# Patient Record
Sex: Female | Born: 1980
Health system: Southern US, Community
[De-identification: ages and names within clinical notes are randomized; demographics above are authoritative.]

## PROBLEM LIST (undated history)

## (undated) DIAGNOSIS — D649 Anemia, unspecified: Secondary | ICD-10-CM

## (undated) DIAGNOSIS — C866 Primary cutaneous CD30-positive T-cell proliferations not having achieved remission: Secondary | ICD-10-CM

## (undated) DIAGNOSIS — L509 Urticaria, unspecified: Secondary | ICD-10-CM

## (undated) DIAGNOSIS — G709 Myoneural disorder, unspecified: Secondary | ICD-10-CM

## (undated) DIAGNOSIS — F419 Anxiety disorder, unspecified: Secondary | ICD-10-CM

## (undated) DIAGNOSIS — F329 Major depressive disorder, single episode, unspecified: Secondary | ICD-10-CM

## (undated) DIAGNOSIS — K219 Gastro-esophageal reflux disease without esophagitis: Secondary | ICD-10-CM

## (undated) DIAGNOSIS — M797 Fibromyalgia: Secondary | ICD-10-CM

## (undated) DIAGNOSIS — G250 Essential tremor: Secondary | ICD-10-CM

## (undated) DIAGNOSIS — K589 Irritable bowel syndrome without diarrhea: Secondary | ICD-10-CM

## (undated) DIAGNOSIS — F319 Bipolar disorder, unspecified: Secondary | ICD-10-CM

## (undated) DIAGNOSIS — T7840XA Allergy, unspecified, initial encounter: Secondary | ICD-10-CM

## (undated) DIAGNOSIS — Z6791 Unspecified blood type, Rh negative: Secondary | ICD-10-CM

## (undated) DIAGNOSIS — F32A Depression, unspecified: Secondary | ICD-10-CM

## (undated) DIAGNOSIS — E538 Deficiency of other specified B group vitamins: Secondary | ICD-10-CM

## (undated) DIAGNOSIS — N879 Dysplasia of cervix uteri, unspecified: Secondary | ICD-10-CM

## (undated) HISTORY — PX: CERVICAL BIOPSY  W/ LOOP ELECTRODE EXCISION: SUR135

## (undated) HISTORY — DX: Irritable bowel syndrome, unspecified: K58.9

## (undated) HISTORY — DX: Fibromyalgia: M79.7

## (undated) HISTORY — DX: Depression, unspecified: F32.A

## (undated) HISTORY — DX: Allergy, unspecified, initial encounter: T78.40XA

## (undated) HISTORY — DX: Bipolar disorder, unspecified: F31.9

## (undated) HISTORY — DX: Dysplasia of cervix uteri, unspecified: N87.9

## (undated) HISTORY — PX: COLPOSCOPY: SHX161

## (undated) HISTORY — DX: Essential tremor: G25.0

## (undated) HISTORY — DX: Primary cutaneous CD30-positive T-cell proliferations: C86.6

## (undated) HISTORY — DX: Unspecified blood type, rh negative: Z67.91

## (undated) HISTORY — DX: Myoneural disorder, unspecified: G70.9

## (undated) HISTORY — DX: Anemia, unspecified: D64.9

## (undated) HISTORY — DX: Primary cutaneous CD30-positive T-cell proliferations not having achieved remission: C86.60

## (undated) HISTORY — DX: Deficiency of other specified B group vitamins: E53.8

## (undated) HISTORY — DX: Major depressive disorder, single episode, unspecified: F32.9

## (undated) HISTORY — DX: Urticaria, unspecified: L50.9

## (undated) HISTORY — DX: Anxiety disorder, unspecified: F41.9

## (undated) HISTORY — DX: Gastro-esophageal reflux disease without esophagitis: K21.9

---

## 1998-10-31 ENCOUNTER — Emergency Department (HOSPITAL_COMMUNITY): Admission: EM | Admit: 1998-10-31 | Discharge: 1998-10-31 | Payer: Self-pay | Admitting: Emergency Medicine

## 1999-05-06 ENCOUNTER — Emergency Department (HOSPITAL_COMMUNITY): Admission: EM | Admit: 1999-05-06 | Discharge: 1999-05-06 | Payer: Self-pay | Admitting: Emergency Medicine

## 1999-05-09 ENCOUNTER — Emergency Department (HOSPITAL_COMMUNITY): Admission: EM | Admit: 1999-05-09 | Discharge: 1999-05-09 | Payer: Self-pay | Admitting: Emergency Medicine

## 2002-02-06 ENCOUNTER — Encounter (INDEPENDENT_AMBULATORY_CARE_PROVIDER_SITE_OTHER): Payer: Self-pay | Admitting: Gastroenterology

## 2002-05-20 ENCOUNTER — Encounter (INDEPENDENT_AMBULATORY_CARE_PROVIDER_SITE_OTHER): Payer: Self-pay | Admitting: Gastroenterology

## 2002-05-20 ENCOUNTER — Encounter: Payer: Self-pay | Admitting: Internal Medicine

## 2002-05-20 LAB — HM COLONOSCOPY: HM Colonoscopy: NORMAL

## 2003-01-17 HISTORY — PX: DILATION AND CURETTAGE OF UTERUS: SHX78

## 2003-07-04 ENCOUNTER — Inpatient Hospital Stay (HOSPITAL_COMMUNITY): Admission: AD | Admit: 2003-07-04 | Discharge: 2003-07-04 | Payer: Self-pay | Admitting: Obstetrics and Gynecology

## 2003-07-11 ENCOUNTER — Ambulatory Visit (HOSPITAL_COMMUNITY): Admission: AD | Admit: 2003-07-11 | Discharge: 2003-07-11 | Payer: Self-pay | Admitting: Obstetrics and Gynecology

## 2003-07-11 ENCOUNTER — Encounter (INDEPENDENT_AMBULATORY_CARE_PROVIDER_SITE_OTHER): Payer: Self-pay | Admitting: Specialist

## 2004-05-19 ENCOUNTER — Ambulatory Visit (HOSPITAL_COMMUNITY): Admission: RE | Admit: 2004-05-19 | Discharge: 2004-05-19 | Payer: Self-pay | Admitting: Obstetrics and Gynecology

## 2004-06-02 ENCOUNTER — Inpatient Hospital Stay (HOSPITAL_COMMUNITY): Admission: AD | Admit: 2004-06-02 | Discharge: 2004-06-02 | Payer: Self-pay | Admitting: Obstetrics and Gynecology

## 2004-09-25 ENCOUNTER — Inpatient Hospital Stay (HOSPITAL_COMMUNITY): Admission: AD | Admit: 2004-09-25 | Discharge: 2004-09-25 | Payer: Self-pay | Admitting: Obstetrics and Gynecology

## 2004-11-14 ENCOUNTER — Inpatient Hospital Stay (HOSPITAL_COMMUNITY): Admission: AD | Admit: 2004-11-14 | Discharge: 2004-11-14 | Payer: Self-pay | Admitting: Obstetrics and Gynecology

## 2004-11-17 ENCOUNTER — Inpatient Hospital Stay (HOSPITAL_COMMUNITY): Admission: AD | Admit: 2004-11-17 | Discharge: 2004-11-17 | Payer: Self-pay | Admitting: Obstetrics and Gynecology

## 2004-11-19 ENCOUNTER — Inpatient Hospital Stay (HOSPITAL_COMMUNITY): Admission: AD | Admit: 2004-11-19 | Discharge: 2004-11-19 | Payer: Self-pay | Admitting: Obstetrics and Gynecology

## 2004-12-10 ENCOUNTER — Inpatient Hospital Stay (HOSPITAL_COMMUNITY): Admission: AD | Admit: 2004-12-10 | Discharge: 2004-12-10 | Payer: Self-pay | Admitting: Obstetrics and Gynecology

## 2004-12-23 ENCOUNTER — Inpatient Hospital Stay (HOSPITAL_COMMUNITY): Admission: AD | Admit: 2004-12-23 | Discharge: 2004-12-23 | Payer: Self-pay | Admitting: Obstetrics and Gynecology

## 2004-12-28 ENCOUNTER — Encounter (INDEPENDENT_AMBULATORY_CARE_PROVIDER_SITE_OTHER): Payer: Self-pay | Admitting: Specialist

## 2004-12-28 ENCOUNTER — Inpatient Hospital Stay (HOSPITAL_COMMUNITY): Admission: RE | Admit: 2004-12-28 | Discharge: 2004-12-31 | Payer: Self-pay | Admitting: Obstetrics and Gynecology

## 2005-04-04 ENCOUNTER — Ambulatory Visit: Payer: Self-pay | Admitting: Internal Medicine

## 2006-04-23 ENCOUNTER — Ambulatory Visit: Payer: Self-pay | Admitting: Internal Medicine

## 2006-08-22 ENCOUNTER — Ambulatory Visit: Payer: Self-pay | Admitting: Internal Medicine

## 2006-08-22 LAB — CONVERTED CEMR LAB
Bilirubin Urine: NEGATIVE
Ketones, ur: NEGATIVE mg/dL
Leukocytes, UA: NEGATIVE
Nitrite: NEGATIVE
Specific Gravity, Urine: 1.02 (ref 1.000–1.03)
Urine Glucose: NEGATIVE mg/dL
Urobilinogen, UA: 1 (ref 0.0–1.0)
pH: 7 (ref 5.0–8.0)

## 2006-08-25 ENCOUNTER — Encounter: Payer: Self-pay | Admitting: Internal Medicine

## 2006-08-25 DIAGNOSIS — R55 Syncope and collapse: Secondary | ICD-10-CM | POA: Insufficient documentation

## 2006-08-25 DIAGNOSIS — F319 Bipolar disorder, unspecified: Secondary | ICD-10-CM | POA: Insufficient documentation

## 2007-03-08 ENCOUNTER — Telehealth: Payer: Self-pay | Admitting: Internal Medicine

## 2008-01-06 ENCOUNTER — Ambulatory Visit: Payer: Self-pay | Admitting: Diagnostic Radiology

## 2008-01-06 ENCOUNTER — Emergency Department (HOSPITAL_BASED_OUTPATIENT_CLINIC_OR_DEPARTMENT_OTHER): Admission: EM | Admit: 2008-01-06 | Discharge: 2008-01-06 | Payer: Self-pay | Admitting: Emergency Medicine

## 2008-01-06 ENCOUNTER — Encounter: Payer: Self-pay | Admitting: Internal Medicine

## 2008-01-20 ENCOUNTER — Ambulatory Visit: Payer: Self-pay | Admitting: Internal Medicine

## 2008-01-20 DIAGNOSIS — J069 Acute upper respiratory infection, unspecified: Secondary | ICD-10-CM | POA: Insufficient documentation

## 2008-01-20 DIAGNOSIS — G4700 Insomnia, unspecified: Secondary | ICD-10-CM | POA: Insufficient documentation

## 2008-01-20 DIAGNOSIS — G253 Myoclonus: Secondary | ICD-10-CM | POA: Insufficient documentation

## 2008-01-20 DIAGNOSIS — E538 Deficiency of other specified B group vitamins: Secondary | ICD-10-CM | POA: Insufficient documentation

## 2008-01-20 LAB — CONVERTED CEMR LAB
BUN: 11 mg/dL (ref 6–23)
CO2: 21 meq/L (ref 19–32)
Calcium: 9 mg/dL (ref 8.4–10.5)
Chloride: 100 meq/L (ref 96–112)
Creatinine, Ser: 0.74 mg/dL (ref 0.40–1.20)
Glucose, Bld: 74 mg/dL (ref 70–99)
Magnesium: 2.1 mg/dL (ref 1.5–2.5)
Potassium: 4.2 meq/L (ref 3.5–5.3)
Sed Rate: 15 mm/hr (ref 0–22)
Sodium: 137 meq/L (ref 135–145)
TSH: 1.074 microintl units/mL (ref 0.350–4.50)
Vit D, 1,25-Dihydroxy: 37 (ref 30–89)
Vitamin B-12: 219 pg/mL (ref 211–911)

## 2008-02-24 ENCOUNTER — Ambulatory Visit: Payer: Self-pay | Admitting: Internal Medicine

## 2008-04-06 ENCOUNTER — Telehealth: Payer: Self-pay | Admitting: Internal Medicine

## 2008-04-06 DIAGNOSIS — R109 Unspecified abdominal pain: Secondary | ICD-10-CM | POA: Insufficient documentation

## 2008-04-09 ENCOUNTER — Encounter (INDEPENDENT_AMBULATORY_CARE_PROVIDER_SITE_OTHER): Payer: Self-pay | Admitting: *Deleted

## 2008-04-13 ENCOUNTER — Telehealth: Payer: Self-pay | Admitting: Internal Medicine

## 2008-04-13 DIAGNOSIS — K219 Gastro-esophageal reflux disease without esophagitis: Secondary | ICD-10-CM | POA: Insufficient documentation

## 2008-04-14 ENCOUNTER — Encounter: Payer: Self-pay | Admitting: Internal Medicine

## 2008-04-20 ENCOUNTER — Telehealth: Payer: Self-pay | Admitting: Internal Medicine

## 2008-04-20 ENCOUNTER — Ambulatory Visit: Payer: Self-pay | Admitting: Internal Medicine

## 2008-04-20 DIAGNOSIS — R259 Unspecified abnormal involuntary movements: Secondary | ICD-10-CM | POA: Insufficient documentation

## 2008-04-21 LAB — CONVERTED CEMR LAB
ALT: 20 units/L (ref 0–35)
AST: 19 units/L (ref 0–37)
Albumin: 3.8 g/dL (ref 3.5–5.2)
Alkaline Phosphatase: 36 units/L — ABNORMAL LOW (ref 39–117)
BUN: 8 mg/dL (ref 6–23)
Basophils Absolute: 0 10*3/uL (ref 0.0–0.1)
Basophils Relative: 0 % (ref 0–1)
Bilirubin, Direct: 0.1 mg/dL (ref 0.0–0.3)
CO2: 22 meq/L (ref 19–32)
Calcium: 8.9 mg/dL (ref 8.4–10.5)
Chloride: 104 meq/L (ref 96–112)
Creatinine, Ser: 0.73 mg/dL (ref 0.40–1.20)
Eosinophils Absolute: 0 10*3/uL (ref 0.0–0.7)
Eosinophils Relative: 0 % (ref 0–5)
Folate: 6.2 ng/mL
Glucose, Bld: 82 mg/dL (ref 70–99)
HCT: 37.5 % (ref 36.0–46.0)
Hemoglobin: 12.5 g/dL (ref 12.0–15.0)
Indirect Bilirubin: 0.3 mg/dL (ref 0.0–0.9)
Lymphocytes Relative: 37 % (ref 12–46)
Lymphs Abs: 3.3 10*3/uL (ref 0.7–4.0)
MCHC: 33.3 g/dL (ref 30.0–36.0)
MCV: 84.7 fL (ref 78.0–100.0)
Monocytes Absolute: 0.5 10*3/uL (ref 0.1–1.0)
Monocytes Relative: 6 % (ref 3–12)
Neutro Abs: 5.1 10*3/uL (ref 1.7–7.7)
Neutrophils Relative %: 57 % (ref 43–77)
Platelets: 274 10*3/uL (ref 150–400)
Potassium: 4.3 meq/L (ref 3.5–5.3)
RBC: 4.43 M/uL (ref 3.87–5.11)
RDW: 12.7 % (ref 11.5–15.5)
Sed Rate: 8 mm/hr (ref 0–22)
Sodium: 139 meq/L (ref 135–145)
TSH: 0.78 microintl units/mL (ref 0.350–4.500)
Total Bilirubin: 0.4 mg/dL (ref 0.3–1.2)
Total Protein: 6.5 g/dL (ref 6.0–8.3)
Vitamin B-12: 406 pg/mL (ref 211–911)
WBC: 9 10*3/uL (ref 4.0–10.5)

## 2008-04-22 ENCOUNTER — Telehealth: Payer: Self-pay | Admitting: Internal Medicine

## 2008-04-25 ENCOUNTER — Encounter: Payer: Self-pay | Admitting: Internal Medicine

## 2008-04-29 ENCOUNTER — Telehealth: Payer: Self-pay | Admitting: Internal Medicine

## 2008-05-01 ENCOUNTER — Encounter: Payer: Self-pay | Admitting: Internal Medicine

## 2008-05-08 DIAGNOSIS — K209 Esophagitis, unspecified without bleeding: Secondary | ICD-10-CM | POA: Insufficient documentation

## 2008-05-08 DIAGNOSIS — K2940 Chronic atrophic gastritis without bleeding: Secondary | ICD-10-CM | POA: Insufficient documentation

## 2008-05-08 DIAGNOSIS — K5289 Other specified noninfective gastroenteritis and colitis: Secondary | ICD-10-CM | POA: Insufficient documentation

## 2008-05-08 DIAGNOSIS — K589 Irritable bowel syndrome without diarrhea: Secondary | ICD-10-CM | POA: Insufficient documentation

## 2008-06-06 ENCOUNTER — Ambulatory Visit: Payer: Self-pay | Admitting: Family Medicine

## 2008-06-06 DIAGNOSIS — L509 Urticaria, unspecified: Secondary | ICD-10-CM | POA: Insufficient documentation

## 2008-07-01 ENCOUNTER — Telehealth: Payer: Self-pay | Admitting: Internal Medicine

## 2008-09-10 ENCOUNTER — Ambulatory Visit: Payer: Self-pay | Admitting: Internal Medicine

## 2008-09-10 DIAGNOSIS — L049 Acute lymphadenitis, unspecified: Secondary | ICD-10-CM | POA: Insufficient documentation

## 2008-10-21 ENCOUNTER — Ambulatory Visit: Payer: Self-pay | Admitting: Internal Medicine

## 2008-10-21 DIAGNOSIS — R5381 Other malaise: Secondary | ICD-10-CM | POA: Insufficient documentation

## 2008-10-21 DIAGNOSIS — Z720 Tobacco use: Secondary | ICD-10-CM | POA: Insufficient documentation

## 2008-10-21 DIAGNOSIS — R5383 Other fatigue: Secondary | ICD-10-CM

## 2008-10-22 ENCOUNTER — Telehealth: Payer: Self-pay | Admitting: Internal Medicine

## 2008-10-28 LAB — CONVERTED CEMR LAB
ALT: 22 units/L (ref 0–35)
AST: 16 units/L (ref 0–37)
Albumin: 4.2 g/dL (ref 3.5–5.2)
Alkaline Phosphatase: 52 units/L (ref 39–117)
BUN: 8 mg/dL (ref 6–23)
Bacteria, UA: NONE SEEN
Basophils Absolute: 0 10*3/uL (ref 0.0–0.1)
Basophils Relative: 0 % (ref 0–1)
Bilirubin, Direct: <0.1 mg/dL (ref 0.0–0.3)
CO2: 23 meq/L (ref 19–32)
Calcium: 9.1 mg/dL (ref 8.4–10.5)
Chloride: 105 meq/L (ref 96–112)
Creatinine, Ser: 0.83 mg/dL (ref 0.40–1.20)
Eosinophils Absolute: 0.1 10*3/uL (ref 0.0–0.7)
Eosinophils Relative: 1 % (ref 0–5)
Glucose, Bld: 103 mg/dL — ABNORMAL HIGH (ref 70–99)
HCT: 40.2 % (ref 36.0–46.0)
Hemoglobin: 13.9 g/dL (ref 12.0–15.0)
Lymphocytes Relative: 48 % — ABNORMAL HIGH (ref 12–46)
Lymphs Abs: 4.3 10*3/uL — ABNORMAL HIGH (ref 0.7–4.0)
MCHC: 34.6 g/dL (ref 30.0–36.0)
MCV: 85.7 fL (ref 78.0–100.0)
Monocytes Absolute: 0.7 10*3/uL (ref 0.1–1.0)
Monocytes Relative: 8 % (ref 3–12)
Neutro Abs: 3.8 10*3/uL (ref 1.7–7.7)
Neutrophils Relative %: 43 % (ref 43–77)
Platelets: 280 10*3/uL (ref 150–400)
Potassium: 4.5 meq/L (ref 3.5–5.3)
RBC / HPF: NONE SEEN (ref <3)
RBC: 4.69 M/uL (ref 3.87–5.11)
RDW: 11.8 % (ref 11.5–15.5)
Sed Rate: 5 mm/hr (ref 0–22)
Sodium: 139 meq/L (ref 135–145)
Total Bilirubin: 0.3 mg/dL (ref 0.3–1.2)
Total Protein: 7.1 g/dL (ref 6.0–8.3)
Vitamin B-12: 798 pg/mL (ref 211–911)
WBC, UA: NONE SEEN cells/hpf (ref <3)
WBC: 8.9 10*3/uL (ref 4.0–10.5)

## 2009-01-25 ENCOUNTER — Ambulatory Visit: Payer: Self-pay | Admitting: Internal Medicine

## 2009-01-25 DIAGNOSIS — J020 Streptococcal pharyngitis: Secondary | ICD-10-CM | POA: Insufficient documentation

## 2009-01-25 LAB — CONVERTED CEMR LAB: Rapid Strep: POSITIVE

## 2009-07-08 ENCOUNTER — Telehealth: Payer: Self-pay | Admitting: Internal Medicine

## 2009-07-26 ENCOUNTER — Ambulatory Visit: Payer: Self-pay | Admitting: Internal Medicine

## 2009-07-26 ENCOUNTER — Encounter (INDEPENDENT_AMBULATORY_CARE_PROVIDER_SITE_OTHER): Payer: Self-pay | Admitting: *Deleted

## 2009-07-26 DIAGNOSIS — J019 Acute sinusitis, unspecified: Secondary | ICD-10-CM | POA: Insufficient documentation

## 2009-08-06 ENCOUNTER — Encounter: Payer: Self-pay | Admitting: Internal Medicine

## 2009-08-11 ENCOUNTER — Ambulatory Visit: Payer: Self-pay | Admitting: Internal Medicine

## 2009-10-04 ENCOUNTER — Inpatient Hospital Stay (HOSPITAL_COMMUNITY): Admission: RE | Admit: 2009-10-04 | Discharge: 2009-10-07 | Payer: Self-pay | Admitting: Obstetrics and Gynecology

## 2009-10-07 ENCOUNTER — Encounter: Admission: RE | Admit: 2009-10-07 | Discharge: 2009-11-03 | Payer: Self-pay | Admitting: Obstetrics and Gynecology

## 2009-10-12 ENCOUNTER — Encounter: Admission: RE | Admit: 2009-10-12 | Discharge: 2009-10-12 | Payer: Self-pay | Admitting: Obstetrics and Gynecology

## 2009-10-13 ENCOUNTER — Inpatient Hospital Stay (HOSPITAL_COMMUNITY): Admission: AD | Admit: 2009-10-13 | Discharge: 2009-10-15 | Payer: Self-pay | Admitting: Obstetrics and Gynecology

## 2009-11-11 ENCOUNTER — Telehealth: Payer: Self-pay | Admitting: Internal Medicine

## 2010-01-21 ENCOUNTER — Emergency Department (HOSPITAL_COMMUNITY)
Admission: EM | Admit: 2010-01-21 | Discharge: 2010-01-21 | Payer: Self-pay | Source: Home / Self Care | Admitting: Emergency Medicine

## 2010-01-24 ENCOUNTER — Telehealth: Payer: Self-pay | Admitting: Internal Medicine

## 2010-01-25 ENCOUNTER — Telehealth: Payer: Self-pay | Admitting: Internal Medicine

## 2010-01-25 ENCOUNTER — Ambulatory Visit
Admission: RE | Admit: 2010-01-25 | Discharge: 2010-01-25 | Payer: Self-pay | Source: Home / Self Care | Attending: Internal Medicine | Admitting: Internal Medicine

## 2010-01-25 DIAGNOSIS — R61 Generalized hyperhidrosis: Secondary | ICD-10-CM | POA: Insufficient documentation

## 2010-01-25 DIAGNOSIS — I10 Essential (primary) hypertension: Secondary | ICD-10-CM | POA: Insufficient documentation

## 2010-01-31 LAB — URINALYSIS, ROUTINE W REFLEX MICROSCOPIC
Bilirubin Urine: NEGATIVE
Hgb urine dipstick: NEGATIVE
Ketones, ur: NEGATIVE mg/dL
Nitrite: NEGATIVE
Protein, ur: NEGATIVE mg/dL
Specific Gravity, Urine: 1.03 (ref 1.005–1.030)
Urine Glucose, Fasting: NEGATIVE mg/dL
Urobilinogen, UA: 1 mg/dL (ref 0.0–1.0)
pH: 6 (ref 5.0–8.0)

## 2010-01-31 LAB — POCT I-STAT, CHEM 8
BUN: 7 mg/dL (ref 6–23)
Calcium, Ion: 1.11 mmol/L — ABNORMAL LOW (ref 1.12–1.32)
Chloride: 108 mEq/L (ref 96–112)
Creatinine, Ser: 0.7 mg/dL (ref 0.4–1.2)
Glucose, Bld: 92 mg/dL (ref 70–99)
HCT: 40 % (ref 36.0–46.0)
Hemoglobin: 13.6 g/dL (ref 12.0–15.0)
Potassium: 3.5 mEq/L (ref 3.5–5.1)
Sodium: 140 mEq/L (ref 135–145)
TCO2: 25 mmol/L (ref 0–100)

## 2010-01-31 LAB — DIFFERENTIAL
Basophils Absolute: 0.1 10*3/uL (ref 0.0–0.1)
Basophils Relative: 1 % (ref 0–1)
Eosinophils Absolute: 0.1 10*3/uL (ref 0.0–0.7)
Eosinophils Relative: 2 % (ref 0–5)
Lymphocytes Relative: 49 % — ABNORMAL HIGH (ref 12–46)
Lymphs Abs: 4 10*3/uL (ref 0.7–4.0)
Monocytes Absolute: 0.5 10*3/uL (ref 0.1–1.0)
Monocytes Relative: 6 % (ref 3–12)
Neutro Abs: 3.5 10*3/uL (ref 1.7–7.7)
Neutrophils Relative %: 43 % (ref 43–77)

## 2010-01-31 LAB — CBC
HCT: 38.2 % (ref 36.0–46.0)
Hemoglobin: 13.3 g/dL (ref 12.0–15.0)
MCH: 30.6 pg (ref 26.0–34.0)
MCHC: 34.8 g/dL (ref 30.0–36.0)
MCV: 88 fL (ref 78.0–100.0)
Platelets: 262 10*3/uL (ref 150–400)
RBC: 4.34 MIL/uL (ref 3.87–5.11)
RDW: 13.2 % (ref 11.5–15.5)
WBC: 8.1 10*3/uL (ref 4.0–10.5)

## 2010-02-02 ENCOUNTER — Telehealth: Payer: Self-pay | Admitting: Internal Medicine

## 2010-02-06 ENCOUNTER — Encounter: Payer: Self-pay | Admitting: Obstetrics and Gynecology

## 2010-02-08 ENCOUNTER — Telehealth: Payer: Self-pay | Admitting: Internal Medicine

## 2010-02-15 NOTE — Assessment & Plan Note (Signed)
Summary: SINUS PRESSURE---DR AVP PT/NO CLINIC--STC   Vital Signs:  Patient profile:   30 year old female Height:      68 inches (172.72 cm) O2 Sat:      97 % on Room air Temp:     97.9 degrees F (36.61 degrees C) oral Pulse rate:   111 / minute BP sitting:   110 / 68  (left arm) Cuff size:   regular  Vitals Entered By: Orlan Leavens (July 26, 2009 2:01 PM)  O2 Flow:  Room air CC: sinus pressure, URI symptoms Is Patient Diabetic? No Pain Assessment Patient in pain? no      Comments Pt states she is [redacted] weeks pregnant been having sinus pressure no relief   Primary Care Provider:  Georgina Quint Plotnikov MD  CC:  sinus pressure and URI symptoms.  History of Present Illness:  URI Symptoms      This is a 30 year old woman who presents with URI symptoms.  The symptoms began 1 week ago.  The severity is described as moderate.  progressive pressure in maxillary region. +nasal drainage. [redacted] weeks pregnant - called ob-gyn 1st who referred to her PCP. using OTC tylenol cold and sinus with incomplete relief.  The patient reports nasal congestion, purulent nasal discharge, sore throat, dry cough, and earache, but denies clear nasal discharge and sick contacts.  Associated symptoms include low-grade fever (<100.5 degrees).  The patient denies dyspnea, wheezing, rash, vomiting, and diarrhea.  The patient also reports headache and muscle aches.  The patient denies sneezing and seasonal symptoms.  Risk factors for Strep sinusitis include tooth pain.  The patient denies the following risk factors for Strep sinusitis: tender adenopathy and absence of cough.    Current Medications (verified): 1)  Vitamin B-12 Cr 1000 Mcg  Tbcr (Cyanocobalamin) .... Take One Tablet By Mouth Daily 2)  Omeprazole 20 Mg Cpdr (Omeprazole) .... 2 Once Daily 3)  Prenatal Vitamin  Allergies (verified): 1)  Zoloft (Sertraline Hcl) 2)  Carbidopa-Levodopa (Carbidopa-Levodopa)  Past History:  Past Medical History: Was checked  for seizures w Brayton Layman. 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  The patient denies fever, decreased hearing, hoarseness, and chest pain.    Physical Exam  General:  alert, well-developed, well-nourished, well-hydrated, appropriate dress, normal appearance, healthy-appearing, cooperative to examination, and good hygiene.  29wk preg Eyes:  vision grossly intact; pupils equal, round and reactive to light.  conjunctiva and lids normal.    Ears:  normal pinnae bilaterally, without erythema, swelling, or tenderness to palpation. R TM clear, L TM hazy with erythema but without effusion, or cerumen impaction. Hearing grossly normal bilaterally  Mouth:  teeth and gums in good repair; mucous membranes moist, without lesions or ulcers. oropharynx clear without exudate, mod erythema.  Neck:  supple, full ROM, no masses, no thyromegaly; no thyroid nodules or tenderness. no JVD or carotid bruits.   Lungs:  normal respiratory effort, no intercostal retractions or use of accessory muscles; normal breath sounds bilaterally - no crackles and no wheezes.    Heart:  normal rate, regular rhythm, no murmur, and no rub. BLE without edema.   Impression & Recommendations:  Problem # 1:  ACUTE SINUSITIS, UNSPECIFIED (ICD-461.9)  progressive symptoms and classic hx - tx 7 days abx  with caution  - advised to f/u ob as ongoing (appt tomorrow) cont tylenol cold med for ache and discomfort - emperic diflucan for hx yeast infx s/p abx use - Her updated medication list for this problem includes:    Amoxicillin 500 Mg Tabs (Amoxicillin) .Marland Kitchen... 1 by mouth three times a day x 7 days  Instructed on treatment. Call if symptoms persist or worsen.   Orders: Prescription Created Electronically [phone])  Complete Medication  List: 1)  Vitamin B-12 Cr 1000 Mcg Tbcr (Cyanocobalamin) .... Take one tablet by mouth daily 2)  Omeprazole 20 Mg Cpdr (Omeprazole) .... 2 once daily 3)  Prenatal Vitamin  4)  Amoxicillin 500 Mg Tabs (Amoxicillin) .Marland Kitchen.. 1 by mouth three times a day x 7 days 5)  Diflucan 150 Mg Tabs (Fluconazole) .Marland Kitchen.. 1 by mouth x 1 as needed for yeast, may repeat as needed daily  Patient Instructions: 1)  it was good to see you today. 2)  amoxicillin for your sinus symptoms and infection - also diflucan as discussed - your prescriptions have been electronically submitted to your pharmacy. Please take as directed. Contact our office if you believe you're having problems with the medication(s).  3)  continue the tylenol for aches and pains - call dr. Richardson Dopp if something "stronger" is needed - 4)  work note for today provided as discussed - 5)  Get plenty of rest, drink lots of clear liquids, and use Tylenol  for fever and comfort. Return in 7-10 days if you're not better:sooner if you're feeling worse. Prescriptions: DIFLUCAN 150 MG TABS (FLUCONAZOLE) 1 by mouth x 1 as needed for yeast, may repeat as needed daily  #2 x 1   Entered and Authorized by:   Newt Lukes MD   Signed by:   Newt Lukes MD on 07/26/2009   Method used:   Electronically to        [id number] (retail)       [address]       [place], [place]  [address]       Ph: [phone]       Fax: [phone]   RxID:   [id number] AMOXICILLIN 500 MG TABS (AMOXICILLIN) 1 by mouth three times a day x 7 days  #21 x 0   Entered and Authorized by:   Newt Lukes MD   Signed by:   Newt Lukes MD on 07/26/2009   Method used:   Electronically to        [id number]* (retail)       [address]       [place], [place]  [address]       Ph: [phone]       Fax: [phone]   RxID:   [id number]

## 2010-02-15 NOTE — Assessment & Plan Note (Signed)
Summary: DR AVP PT-WHITE POCKETS/L SORE THROAT W/L EAR ACHE-DR AVP NO ...   Vital Signs:  Patient profile:   30 year old female Height:      68 inches Weight:      171 pounds BMI:     26.09 O2 Sat:      98 % on Room air Temp:     99.4 degrees F oral Pulse rate:   83 / minute Pulse rhythm:   regular Resp:     16 per minute BP sitting:   118 / 82  (left arm) Cuff size:   large  Vitals Entered By: Rock Nephew CMA (January 25, 2009 10:09 AM)  Nutrition Counseling: Patient's BMI is greater than 25 and therefore counseled on weight management options.  O2 Flow:  Room air CC: sore throat, fever x 1wk, URI symptoms   Primary Care Provider:  Tresa Garter MD  CC:  sore throat, fever x 1wk, and URI symptoms.  History of Present Illness:  URI Symptoms      This is a 30 year old woman who presents with URI symptoms.  The symptoms began 2 days ago.  The severity is described as moderate.  The patient reports sore throat, earache, and sick contacts, but denies nasal congestion, clear nasal discharge, purulent nasal discharge, and dry cough.  Associated symptoms include fever of 100.5-103 degrees.  The patient denies stiff neck, dyspnea, wheezing, rash, vomiting, diarrhea, and use of an antipyretic.  The patient also reports muscle aches.  The patient denies headache and severe fatigue.  The patient denies the following risk factors for Strep sinusitis: double sickening, tender adenopathy, and absence of cough.    Preventive Screening-Counseling & Management  Alcohol-Tobacco     Alcohol drinks/day: 0     Smoking Status: current     Packs/Day: 1 ppd  Caffeine-Diet-Exercise     Does Patient Exercise: yes      Drug Use:  no.    Current Medications (verified): 1)  Temazepam 15 Mg Caps (Temazepam) .Marland Kitchen.. 1-2 By Mouth At Bedtime As Needed Insomnia 2)  Vitamin B-12 Cr 1000 Mcg  Tbcr (Cyanocobalamin) .... Take One Tablet By Mouth Daily 3)  Vitamin D3 1000 Unit  Tabs (Cholecalciferol)  .Marland Kitchen.. 1 By Mouth Daily 4)  Triamcinolone Acetonide 0.5 % Crea (Triamcinolone Acetonide) .... Apply Bid To Affected Area 5)  Omeprazole 20 Mg Cpdr (Omeprazole) .... 2 Once Daily 6)  Prenatal Vitamin 7)  Progesterone  Allergies (verified): 1)  Zoloft (Sertraline Hcl) 2)  Carbidopa-Levodopa (Carbidopa-Levodopa)  Past History:  Past Medical History: Reviewed history from 04/20/2008 and no changes required. Was checked for seizures w Brayton Layman. 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 GM - siezures  Social History: Reviewed history from 01/20/2008 and no changes required. [facility]  Current Smoker Alcohol use-no Married Drug use-no Regular exercise-yes Drug Use:  no Does Patient Exercise:  yes  Review of Systems       The patient complains of fever and enlarged lymph nodes.  The patient denies anorexia, chest pain, hemoptysis, abdominal pain, and angioedema.    Physical Exam  General:  alert, well-developed, well-nourished, well-hydrated, appropriate dress, normal appearance, healthy-appearing, cooperative to examination, and good hygiene.   Head:  normocephalic and atraumatic.   Ears:  R cerumen impaction and L TM erythema.   Nose:  External nasal examination shows no deformity or inflammation. Nasal mucosa are pink and moist without lesions or exudates. Mouth:  no exudates, no posterior lymphoid hypertrophy, no postnasal drip, no pharyngeal crowing, no lesions, no aphthous ulcers, no leukoplakia, no petechiae, and pharyngeal erythema.   Neck:  supple, full ROM, no masses, no thyromegaly, no thyroid nodules or tenderness, no neck tenderness, and cervical lymphadenopathy.   Lungs:  normal respiratory effort, no  intercostal retractions, no accessory muscle use, normal breath sounds, no dullness, no fremitus, no crackles, and no wheezes.   Heart:  normal rate, regular rhythm, no murmur, no gallop, no rub, and no JVD.   Abdomen:  soft, non-tender, normal bowel sounds, no distention, no masses, no guarding, no hepatomegaly, and no splenomegaly.   Skin:  Intact without suspicious lesions or rashes Cervical Nodes:  L anterior LN tender and R anterior LN enlarged.   Axillary Nodes:  no R axillary adenopathy and no L axillary adenopathy.   Inguinal Nodes:  no R inguinal adenopathy and no L inguinal adenopathy.   Psych:  Oriented X3.     Impression & Recommendations:  Problem # 1:  PHARYNGITIS-ACUTE (ICD-462) Assessment New  The following medications were removed from the medication list:    Flagyl 500 Mg Tabs (Metronidazole) .Marland Kitchen... Three times a day Her updated medication list for this problem includes:    Amoxicillin 500 Mg Cap (Amoxicillin) .Marland Kitchen... Take 1 capsule by mouth three times a day x 10 days  Orders: Rapid Strep (10[address])  Instructed to complete antibiotics and call if not improved in 48 hours.   Problem # 2:  STREP THROAT (ICD-034.0) Assessment: New  The following medications were removed from the medication list:    Flagyl 500 Mg Tabs (Metronidazole) .Marland Kitchen... Three times a day Her updated medication list for this problem includes:    Amoxicillin 500 Mg Cap (Amoxicillin) .Marland Kitchen... Take 1 capsule by mouth three times a day x 10 days  Orders: Rapid Strep (53664)  Instructed to complete antibiotics and call if not improved in 48 hours.   Complete Medication List: 1)  Temazepam 15 Mg Caps (Temazepam) .Marland Kitchen.. 1-2 by mouth at bedtime as needed insomnia 2)  Vitamin B-12 Cr 1000 Mcg Tbcr (Cyanocobalamin) .... Take one tablet by mouth daily 3)  Vitamin D3 1000 Unit Tabs (Cholecalciferol) .Marland Kitchen.. 1 by mouth daily 4)  Triamcinolone Acetonide 0.5 % Crea (Triamcinolone acetonide) .... Apply bid to affected  area 5)  Omeprazole 20 Mg Cpdr (Omeprazole) .... 2 once daily 6)  Prenatal Vitamin  7)  Progesterone  8)  Amoxicillin 500 Mg Cap (Amoxicillin) .... Take 1 capsule by mouth three times a day x 10 days 9)  Diflucan 150 Mg Tabs (Fluconazole) .... One by mouth as directed  Patient Instructions: 1)  Please schedule a follow-up appointment in 1 month. 2)  Get plenty of rest, drink lots of clear liquids, and use Tylenol or Ibuprofen for fever and comfort. Return in 7-10 days if you're not better:sooner if you're feeling worse. 3)  Take your antibiotic as prescribed until ALL of it is gone, but stop if you develop a rash or swelling and contact our office as soon as possible. Prescriptions: DIFLUCAN 150 MG TABS (FLUCONAZOLE) One by mouth as directed  #1 x 3   Entered and Authorized by:   Etta Grandchild MD   Signed by:   Etta Grandchild MD on 01/25/2009   Method  used:   Electronically to        [facility] [address]* (retail)       [address]       [place], [place]  [address]       Ph: [phone]       Fax: [phone]   RxID:   [id number] AMOXICILLIN 500 MG CAP (AMOXICILLIN) Take 1 capsule by mouth three times a day X 10 days  #30 x 0   Entered and Authorized by:   Etta Grandchild MD   Signed by:   Etta Grandchild MD on 01/25/2009   Method used:   Electronically to        [id number]* (retail)       [address]       [place], [place]  [address]       Ph: [phone]       Fax: [phone]   RxID:   [id number]   Laboratory Results    Other Tests  Rapid Strep: positive

## 2010-02-15 NOTE — Progress Notes (Signed)
Summary: OV TOMORROW  Phone Note Call from Patient Call back at [facility] Phone [phone]   Summary of Call: Pt had a c-section 6 wks ago. She developed a open wound below the csection incision approx 1 wk ago. OB gave pt oral antibiotic but told her that they did not think it was infected. She does not trust her OBGYN anymore and is very worried about the wound. No fever, pain is minor but she trusts Dr Posey Rea and wants him to eval. I scheduled pt for apt tomorrow and she will call or go to ER w/any severe symptoms.  Initial call taken by: Lamar Sprinkles, CMA,  November 11, 2009 11:43 AM  Follow-up for Phone Call        ok to work in Follow-up by: Tresa Garter MD,  November 11, 2009 1:29 PM

## 2010-02-15 NOTE — Letter (Signed)
Summary: Out of Work  [facility]  [address] [place], [place] [address]   Phone: [phone]  Fax: [phone]    July 26, 2009   Employee:  GALILEE PIERRON    To Whom It May Concern:   For Medical reasons, please excuse the above named employee from work for the following dates:  Start: 07/26/09    End: 07/27/09, may return to work Tuesday    If you need additional information, please feel free to contact our office.         Sincerely,    Dr. Rene Paci

## 2010-02-15 NOTE — Progress Notes (Signed)
Summary: Lab Orders  Phone Note Other Incoming   Caller: pt Summary of Call: pt needs a prescription written for her to have her physical labs drawn at [facility]. They can be faxed to [phone] Initial call taken by: Ami Bullins CMA,  July 08, 2009 11:18 AM  Follow-up for Phone Call        ok done Follow-up by: Tresa Garter MD,  July 08, 2009 12:45 PM  Additional Follow-up for Phone Call Additional follow up Details #1::        Done. Additional Follow-up by: Lucious Groves,  July 08, 2009 5:02 PM    New/Updated Medications: * LABS CBC, TSH, BMET, Hepatic panel, UA, Lipids, Dx: V70.0 Prescriptions: LABS CBC, TSH, BMET, Hepatic panel, UA, Lipids, Dx: V70.0  #0 x 0   Entered by:   Lucious Groves   Authorized by:   Tresa Garter MD   Signed by:   Lucious Groves on 07/08/2009   Method used:   Print then Give to Patient   RxID:   [id number]

## 2010-02-15 NOTE — Assessment & Plan Note (Signed)
Summary: CPX/[facility]/#/CD   Vital Signs:  Patient profile:   30 year old female Height:      68 inches Weight:      196 pounds BMI:     29.91 O2 Sat:      96 % on Room air Temp:     98.1 degrees F oral Pulse rate:   112 / minute Pulse rhythm:   regular Resp:     16 per minute BP sitting:   110 / 72  (left arm) Cuff size:   regular  Vitals Entered By: Lanier Prude, CMA(AAMA) (August 11, 2009 3:11 PM)  O2 Flow:  Room air CC: CPX Is Patient Diabetic? No Comments pt is not taking Diflucan   Primary Care Provider:  Tresa Garter MD  CC:  CPX.  History of Present Illness: The patient presents for a wellness examination  Gained 22 lbs  She is due in 2 wks  Current Medications (verified): 1)  Vitamin B-12 Cr 1000 Mcg  Tbcr (Cyanocobalamin) .... Take One Tablet By Mouth Daily 2)  Omeprazole 20 Mg Cpdr (Omeprazole) .... 2 Once Daily 3)  Prenatal Vitamin 4)  Diflucan 150 Mg Tabs (Fluconazole) .Marland Kitchen.. 1 By Mouth X 1 As Needed For Yeast, May Repeat As Needed Daily  Allergies (verified): 1)  Zoloft (Sertraline Hcl) 2)  Carbidopa-Levodopa (Carbidopa-Levodopa)  Past History:  Past Medical History: Last updated: 07/26/2009 Was checked for seizures w Brayton Layman. 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  The patient denies anorexia, fever, weight loss, vision loss, decreased hearing, hoarseness, chest pain, syncope, dyspnea on exertion, peripheral edema, prolonged cough, headaches, hemoptysis, abdominal pain,  melena, hematochezia, severe indigestion/heartburn, hematuria, incontinence, genital sores, muscle weakness, suspicious skin lesions, transient blindness, difficulty walking, depression, unusual weight change, abnormal bleeding, enlarged lymph nodes, angioedema, and breast masses.    Physical Exam  General:  alert, well-developed, well-nourished, pregnant, well-hydrated, appropriate dress, normal appearance, healthy-appearing, cooperative to examination, and good hygiene.   Head:  Normocephalic and atraumatic without obvious abnormalities. No apparent alopecia or balding. Eyes:  No corneal or conjunctival inflammation noted. EOMI. Perrla. Ears:  External ear exam shows no significant lesions or deformities.  Otoscopic examination reveals clear canals, tympanic membranes are intact bilaterally without bulging, retraction, inflammation or discharge. Hearing is grossly normal bilaterally. Nose:  External nasal examination shows no deformity or inflammation. Nasal mucosa are pink and moist without lesions or exudates. Mouth:  Oral mucosa and oropharynx without lesions or exudates.  Teeth in good repair. Neck:  No deformities, masses, or tenderness noted. Chest Wall:  No deformities, masses, or tenderness noted. Lungs:  Normal respiratory effort, chest expands symmetrically. Lungs are clear to auscultation, no crackles or wheezes. Heart:  Normal rate and regular rhythm. S1 and S2 normal without gallop, murmur, click, rub or other extra sounds. Abdomen:  Bowel sounds positive,abdomen soft and non-tender without masses, organomegaly or hernias noted. It is large due to pregnancy Msk:  No deformity or scoliosis noted of thoracic or lumbar spine.   Pulses:  R and L carotid,radial,femoral,dorsalis pedis and posterior  tibial pulses are full and equal bilaterally Extremities:  trace left pedal edema and trace right pedal edema.   Neurologic:  No cranial nerve deficits noted. Station and gait are normal.  Plantar reflexes are down-going bilaterally. DTRs are symmetrical throughout. Sensory, motor and coordinative functions appear intact. Skin:  Intact without suspicious lesions or rashes Cervical Nodes:  No lymphadenopathy noted Psych:  Cognition and judgment appear intact. Alert and cooperative with normal attention span and concentration. No apparent delusions, illusions, hallucinations   Impression & Recommendations:  Problem # 1:  PHYSICAL EXAMINATION (ICD-V70.0) Assessment New Health and age related issues were discussed. Available screening tests and vaccinations were discussed as well. Healthy life style including good diet and execise was discussed.  Labs were sent here from 6/6: Hgb 11.6, glu 116, chol 298, TSH 0.315 otherwise WNL. Labs should be rechecked in a nonpregnant test in 6 months   Problem # 2:  Late stages of pregnancy Assessment: New F/u w/her OB/GYN  Complete Medication List: 1)  Vitamin B-12 Cr 1000 Mcg Tbcr (Cyanocobalamin) .... Take one tablet by mouth daily 2)  Omeprazole 20 Mg Cpdr (Omeprazole) .... 2 once daily 3)  Prenatal Vitamin  4)  Diflucan 150 Mg Tabs (Fluconazole) .Marland Kitchen.. 1 by mouth x 1 as needed for yeast, may repeat as needed daily  Patient Instructions: 1)  Valerian root

## 2010-02-17 NOTE — Progress Notes (Signed)
Summary: RFs  Phone Note Call from Patient Call back at [facility] Phone [phone]   Summary of Call: Pt needs refills of BP med and klonopin. OK?  Initial call taken by: Lamar Sprinkles, CMA,  January 25, 2010 10:51 AM  Follow-up for Phone Call        ok to ref x 3 Follow-up by: Tresa Garter MD,  January 25, 2010 1:11 PM    New/Updated Medications: CLONAZEPAM 1 MG TABS (CLONAZEPAM) 1 to 2 once daily as needed Prescriptions: CLONAZEPAM 1 MG TABS (CLONAZEPAM) 1 to 2 once daily as needed  #60 x 0   Entered by:   Lamar Sprinkles, CMA   Authorized by:   Tresa Garter MD   Signed by:   Lamar Sprinkles, CMA on 01/26/2010   Method used:   Telephoned to ...       [facility] [address] (retail)       [address]       [place], [place]  [address]       Ph: [phone]       Fax: [phone]   RxID:   [id number] HYDROCHLOROTHIAZIDE 25 MG TABS (HYDROCHLOROTHIAZIDE) 1 by mouth once daily  #90 x 1   Entered by:   Lamar Sprinkles, CMA   Authorized by:   Jacques Navy MD   Signed by:   Lamar Sprinkles, CMA on 01/26/2010   Method used:   Electronically to        [facility] # [address]* (retail)       [address]       [place], [place]  [address]       Ph: [phone]       Fax: [phone]   RxID:   [id number]

## 2010-02-17 NOTE — Progress Notes (Signed)
Summary: med ?  Phone Note From Pharmacy   Caller: [facility] Summary of Call: rec fax stating pt says Klonopin 1mg  tab is suppose to be the oral disintegrating tablet. Please advise Initial call taken by: Lanier Prude, [facility]),  February 08, 2010 12:06 PM  Follow-up for Phone Call        ok  Follow-up by: Tresa Garter MD,  February 08, 2010 6:14 PM  Additional Follow-up for Phone Call Additional follow up Details #1::        Pharm informed Additional Follow-up by: Lamar Sprinkles, CMA,  February 09, 2010 10:35 AM    New/Updated Medications: CLONAZEPAM 1 MG TABS (CLONAZEPAM) 1 to 2 ODT tabs once daily as needed Prescriptions: CLONAZEPAM 1 MG TABS (CLONAZEPAM) 1 to 2 ODT tabs once daily as needed  #60 x 0   Entered by:   Lamar Sprinkles, CMA   Authorized by:   Tresa Garter MD   Signed by:   Lamar Sprinkles, CMA on 02/09/2010   Method used:   Telephoned to ...       [facility] # [address] (retail)       [address]       [place], [place]  [address]       Ph: [phone]       Fax: [phone]   RxID:   [id number]

## 2010-02-17 NOTE — Progress Notes (Signed)
Summary: Call Report  Phone Note Other Incoming   Caller: [facility] Summary of Call: [facility] Triage Call Report Triage Record Num: [id number] Operator: Amy Rhinehardt Patient Name: Christine Duncan Call Date & Time: 01/21/2010 6:46:55PM Patient Phone: [phone] PCP: Patient Gender: Female PCP Fax : Patient DOB: 06-Jan-1981 Practice Name: [facility] Reason for Call: Pt has hx of post-partum preeclampsia 4 months ago, was in ICU, currently is not under the care of a GYN, states that her is fluctuating at time of call it was 173/108, now  ~20 mins later it's 173/115 pulse 88, has HA, and "floaters on and off" and low energy, has been feeling like this all day today but "on and off since she stopped BP med one month ago", she was on Procardia but didn't go back for follow up so she hasn't been on medication at all for BP. RN advised caller to to ED now, her preference is [facility]. Protocol(s) Used: Hypertension, Diagnosed or Suspected Recommended Outcome per Protocol: See ED Immediately Reason for Outcome: New onset of visual disturbances such as double or blurred vision, spots before eyes or flashing lights Care Advice:  ~ Another adult should drive.  ~ IMMEDIATE ACTION 01/ Initial call taken by: Margaret Pyle, CMA,  January 24, 2010 8:08 AM  Follow-up for Phone Call        noted. Follow-up by: Tresa Garter MD,  January 24, 2010 1:03 PM

## 2010-02-17 NOTE — Assessment & Plan Note (Signed)
Summary: ER FU ON BP/ TODAY 130/90/ STILL NOT FEELING WELL/NWS   Vital Signs:  Patient profile:   30 year old female Height:      68 inches Weight:      173 pounds BMI:     26.40 Temp:     98.3 degrees F oral Pulse rate:   84 / minute Pulse rhythm:   regular Resp:     16 per minute BP sitting:   112 / 80  (left arm) Cuff size:   regular  Vitals Entered By: Lanier Prude, CMA(AAMA) (January 25, 2010 8:45 AM) CC: elevated blood pressure since having her last child >70mo ago Is Patient Diabetic? No Comments pt was recently eval/treated at [facility] ER for the above.  She is not taking Vit b12, Prenatal vit of Diflucan   Primary Care Provider:  Tresa Garter MD  CC:  elevated blood pressure since having her last child >67mo ago.  History of Present Illness: C/o BP elevation. She went to ER for BP176/113 on Fri and she got HCTZ C/o sweats C/o GERD  Current Medications (verified): 1)  Vitamin B-12 Cr 1000 Mcg  Tbcr (Cyanocobalamin) .... Take One Tablet By Mouth Daily 2)  Omeprazole 20 Mg Cpdr (Omeprazole) .... 2 Once Daily 3)  Prenatal Vitamin 4)  Diflucan 150 Mg Tabs (Fluconazole) .Marland Kitchen.. 1 By Mouth X 1 As Needed For Yeast, May Repeat As Needed Daily 5)  Beyaz 3-0.02-0.451 Mg Tabs (Drospiren-Eth Estrad-Levomefol) .Marland Kitchen.. 1 By Mouth Once Daily 6)  Hydrochlorothiazide 25 Mg Tabs (Hydrochlorothiazide) .Marland Kitchen.. 1 By Mouth Once Daily  Allergies (verified): 1)  Zoloft (Sertraline Hcl) 2)  Carbidopa-Levodopa (Carbidopa-Levodopa)  Past History:  Past Surgical History: Last updated: 01/25/2009 Denies surgical history  Family History: Last updated: 01/20/2008 GM - siezures  Past Medical History: Was checked for seizures w Brayton Layman. 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  The patient denies fever, weight loss, chest pain, and abdominal pain.    Physical Exam  General:  alert, well-developed, well-nourished, pregnant, well-hydrated, appropriate dress, normal appearance, healthy-appearing, cooperative to examination, and good hygiene.   Mouth:  Oral mucosa and oropharynx without lesions or exudates.  Teeth in good repair. Neck:  No deformities, masses, or tenderness noted. Lungs:  Normal respiratory effort, chest expands symmetrically. Lungs are clear to auscultation, no crackles or wheezes. Heart:  Normal rate and regular rhythm. S1 and S2 normal without gallop, murmur, click, rub or other extra sounds. Abdomen:  Bowel sounds positive,abdomen soft and non-tender without masses, organomegaly or hernias noted. It is large due to pregnancy Msk:  No deformity or scoliosis noted of thoracic or lumbar spine.   Extremities:  trace left pedal edema and trace right pedal edema.   Neurologic:  No cranial nerve deficits noted. Station and gait are normal. Plantar reflexes are down-going bilaterally. DTRs are symmetrical throughout. Sensory, motor and coordinative functions appear intact. Skin:  Intact without suspicious lesions or rashes Psych:  Cognition and judgment appear intact. Alert and cooperative with normal attention span and concentration. No apparent delusions, illusions, hallucinations   Impression & Recommendations:  Problem # 1:  HYPERTENSION (ICD-401.9) Assessment Deteriorated Improve diet Info re: Low sodium diet and HTN given Her updated medication list for this problem includes:  Hydrochlorothiazide 25 Mg Tabs (Hydrochlorothiazide) .Marland Kitchen... 1 by mouth once daily  Problem # 2:  SWEATING (ICD-780.8) Assessment: New Likely hormonal  Problem # 3:  B12 DEFICIENCY (ICD-266.2) Assessment: Comment Only On the regimen of medicine(s) reflected in the chart    Problem # 4:  GERD (ICD-530.81) Assessment:  Deteriorated Improve diet Her updated medication list for this problem includes:    Omeprazole 20 Mg Cpdr (Omeprazole) .Marland Kitchen... 2 once daily  Complete Medication List: 1)  Vitamin B-12 Cr 1000 Mcg Tbcr (Cyanocobalamin) .... Take one tablet by mouth daily 2)  Omeprazole 20 Mg Cpdr (Omeprazole) .... 2 once daily 3)  Prenatal Vitamin  4)  Diflucan 150 Mg Tabs (Fluconazole) .Marland Kitchen.. 1 by mouth x 1 as needed for yeast, may repeat as needed daily 5)  Beyaz 3-0.02-0.451 Mg Tabs (Drospiren-eth estrad-levomefol) .Marland Kitchen.. 1 by mouth once daily 6)  Hydrochlorothiazide 25 Mg Tabs (Hydrochlorothiazide) .Marland Kitchen.. 1 by mouth once daily  Patient Instructions: 1)  Low sodium diet 2)  Please schedule a follow-up appointment in 2 months. 3)  BMP prior to visit, ICD-9: 4)  TSH prior to visit, ICD-9:401.1 5)  Nl BP <130/85   Orders Added: 1)  Est. Patient Level IV [82956]

## 2010-02-17 NOTE — Progress Notes (Signed)
Summary: RF  Phone Note Refill Request   Refills Requested: Medication #1:  BEYAZ 3-0.02-0.451 MG TABS 1 by mouth once daily ok for 3 mth supply? (no longer seeing OBGYN)  Initial call taken by: Lamar Sprinkles, CMA,  February 02, 2010 11:53 AM  Follow-up for Phone Call        ok 3 mo Follow-up by: Tresa Garter MD,  February 03, 2010 7:43 AM  Additional Follow-up for Phone Call Additional follow up Details #1::        left vm on hm #  Additional Follow-up by: Lamar Sprinkles, CMA,  February 03, 2010 9:04 AM    Prescriptions: BEYAZ 3-0.02-0.451 MG TABS (DROSPIREN-ETH ESTRAD-LEVOMEFOL) 1 by mouth once daily  #3 mth x 1   Entered by:   Lamar Sprinkles, CMA   Authorized by:   Tresa Garter MD   Signed by:   Lamar Sprinkles, CMA on 02/03/2010   Method used:   Faxed to ...       [facility] (retail)       [phone]       [place], [place]  [address]       Ph: [phone]       Fax: [phone]   RxID:   [id number] BEYAZ 3-0.02-0.451 MG TABS (DROSPIREN-ETH ESTRAD-LEVOMEFOL) 1 by mouth once daily  #3 mth x 1   Entered by:   Lamar Sprinkles, CMA   Authorized by:   Tresa Garter MD   Signed by:   Lamar Sprinkles, CMA on 02/03/2010   Method used:   Print then Give to Patient   RxID:   [id number]

## 2010-03-24 ENCOUNTER — Encounter: Payer: Self-pay | Admitting: Internal Medicine

## 2010-03-24 LAB — CONVERTED CEMR LAB
BUN: 14 mg/dL (ref 6–23)
CO2: 30 meq/L (ref 19–32)
Calcium: 9.2 mg/dL (ref 8.4–10.5)
Chloride: 99 meq/L (ref 96–112)
Creatinine, Ser: 0.86 mg/dL (ref 0.40–1.20)
Glucose, Bld: 93 mg/dL (ref 70–99)
Potassium: 3.4 meq/L — ABNORMAL LOW (ref 3.5–5.3)
Sodium: 140 meq/L (ref 135–145)
TSH: 0.413 microintl units/mL (ref 0.350–4.500)

## 2010-03-29 ENCOUNTER — Encounter: Payer: Self-pay | Admitting: Internal Medicine

## 2010-03-29 ENCOUNTER — Ambulatory Visit (INDEPENDENT_AMBULATORY_CARE_PROVIDER_SITE_OTHER): Payer: 59 | Admitting: Internal Medicine

## 2010-03-29 DIAGNOSIS — R5381 Other malaise: Secondary | ICD-10-CM

## 2010-03-29 DIAGNOSIS — H43399 Other vitreous opacities, unspecified eye: Secondary | ICD-10-CM

## 2010-03-29 DIAGNOSIS — K219 Gastro-esophageal reflux disease without esophagitis: Secondary | ICD-10-CM

## 2010-03-29 DIAGNOSIS — I10 Essential (primary) hypertension: Secondary | ICD-10-CM

## 2010-03-31 LAB — COMPREHENSIVE METABOLIC PANEL
ALT: 29 U/L (ref 0–35)
ALT: 47 U/L — ABNORMAL HIGH (ref 0–35)
ALT: 68 U/L — ABNORMAL HIGH (ref 0–35)
AST: 32 U/L (ref 0–37)
Albumin: 2.4 g/dL — ABNORMAL LOW (ref 3.5–5.2)
Alkaline Phosphatase: 84 U/L (ref 39–117)
Alkaline Phosphatase: 90 U/L (ref 39–117)
Alkaline Phosphatase: 96 U/L (ref 39–117)
BUN: 5 mg/dL — ABNORMAL LOW (ref 6–23)
BUN: 6 mg/dL (ref 6–23)
CO2: 27 mEq/L (ref 19–32)
CO2: 27 mEq/L (ref 19–32)
CO2: 28 mEq/L (ref 19–32)
Calcium: 7.7 mg/dL — ABNORMAL LOW (ref 8.4–10.5)
Calcium: 8.2 mg/dL — ABNORMAL LOW (ref 8.4–10.5)
Calcium: 8.4 mg/dL (ref 8.4–10.5)
Chloride: 104 mEq/L (ref 96–112)
Chloride: 108 mEq/L (ref 96–112)
Creatinine, Ser: 0.53 mg/dL (ref 0.4–1.2)
GFR calc Af Amer: >60 mL/min (ref >60)
GFR calc non Af Amer: >60 mL/min (ref >60)
GFR calc non Af Amer: >60 mL/min (ref >60)
GFR calc non Af Amer: >60 mL/min (ref >60)
Glucose, Bld: 88 mg/dL (ref 70–99)
Glucose, Bld: 91 mg/dL (ref 70–99)
Glucose, Bld: 99 mg/dL (ref 70–99)
Potassium: 3.5 mEq/L (ref 3.5–5.1)
Sodium: 137 mEq/L (ref 135–145)
Sodium: 141 mEq/L (ref 135–145)
Total Bilirubin: 0.1 mg/dL — ABNORMAL LOW (ref 0.3–1.2)
Total Bilirubin: 0.3 mg/dL (ref 0.3–1.2)
Total Protein: 5.2 g/dL — ABNORMAL LOW (ref 6.0–8.3)
Total Protein: 6.1 g/dL (ref 6.0–8.3)

## 2010-03-31 LAB — URINALYSIS, ROUTINE W REFLEX MICROSCOPIC
Bilirubin Urine: NEGATIVE
Bilirubin Urine: NEGATIVE
Glucose, UA: NEGATIVE mg/dL
Glucose, UA: NEGATIVE mg/dL
Hgb urine dipstick: NEGATIVE
Hgb urine dipstick: NEGATIVE
Ketones, ur: 15 mg/dL — AB
Ketones, ur: NEGATIVE mg/dL
Nitrite: NEGATIVE
Protein, ur: 30 mg/dL — AB
Protein, ur: NEGATIVE mg/dL
Specific Gravity, Urine: 1.025 (ref 1.005–1.030)
Urobilinogen, UA: 0.2 mg/dL (ref 0.0–1.0)
Urobilinogen, UA: 1 mg/dL (ref 0.0–1.0)
pH: 6 (ref 5.0–8.0)

## 2010-03-31 LAB — CBC
HCT: 27.5 % — ABNORMAL LOW (ref 36.0–46.0)
HCT: 28 % — ABNORMAL LOW (ref 36.0–46.0)
HCT: 31.6 % — ABNORMAL LOW (ref 36.0–46.0)
HCT: 37.8 % (ref 36.0–46.0)
Hemoglobin: 10.7 g/dL — ABNORMAL LOW (ref 12.0–15.0)
Hemoglobin: 13 g/dL (ref 12.0–15.0)
Hemoglobin: 9.5 g/dL — ABNORMAL LOW (ref 12.0–15.0)
Hemoglobin: 9.8 g/dL — ABNORMAL LOW (ref 12.0–15.0)
Hemoglobin: 11.6 g/dL — ABNORMAL LOW (ref 12.0–15.0)
MCH: 31.7 pg (ref 26.0–34.0)
MCH: 31.7 pg (ref 26.0–34.0)
MCH: 31.3 pg (ref 26.0–34.0)
MCHC: 33.9 g/dL (ref 30.0–36.0)
MCHC: 34.5 g/dL (ref 30.0–36.0)
MCHC: 34.7 g/dL (ref 30.0–36.0)
MCHC: 34.9 g/dL (ref 30.0–36.0)
MCV: 90.8 fL (ref 78.0–100.0)
MCV: 90.8 fL (ref 78.0–100.0)
MCV: 91.3 fL (ref 78.0–100.0)
Platelets: 163 10*3/uL (ref 150–400)
Platelets: 187 10*3/uL (ref 150–400)
Platelets: 221 10*3/uL (ref 150–400)
RBC: 3.01 MIL/uL — ABNORMAL LOW (ref 3.87–5.11)
RBC: 3.08 MIL/uL — ABNORMAL LOW (ref 3.87–5.11)
RBC: 3.45 MIL/uL — ABNORMAL LOW (ref 3.87–5.11)
RBC: 3.7 MIL/uL — ABNORMAL LOW (ref 3.87–5.11)
RDW: 14.2 % (ref 11.5–15.5)
RDW: 14.4 % (ref 11.5–15.5)
RDW: 14.4 % (ref 11.5–15.5)
WBC: 10.7 10*3/uL — ABNORMAL HIGH (ref 4.0–10.5)
WBC: 6.3 10*3/uL (ref 4.0–10.5)
WBC: 7.4 10*3/uL (ref 4.0–10.5)

## 2010-03-31 LAB — BASIC METABOLIC PANEL
CO2: 24 mEq/L (ref 19–32)
Calcium: 8.7 mg/dL (ref 8.4–10.5)
Chloride: 104 mEq/L (ref 96–112)
Creatinine, Ser: 0.52 mg/dL (ref 0.4–1.2)
GFR calc Af Amer: >60 mL/min (ref >60)
Sodium: 135 mEq/L (ref 135–145)

## 2010-03-31 LAB — URIC ACID: Uric Acid, Serum: 4.2 mg/dL (ref 2.4–7.0)

## 2010-03-31 LAB — RPR: RPR Ser Ql: NONREACTIVE

## 2010-03-31 LAB — URINE MICROSCOPIC-ADD ON

## 2010-03-31 LAB — LACTATE DEHYDROGENASE: LDH: 204 U/L (ref 94–250)

## 2010-03-31 LAB — SURGICAL PCR SCREEN: MRSA, PCR: NEGATIVE

## 2010-04-05 NOTE — Assessment & Plan Note (Signed)
Summary: 2 MO FU / WILL DO LABS AT [facility]/  [facility]  /NWS   Vital Signs:  Patient profile:   30 year old female Height:      68 inches Weight:      169 pounds BMI:     25.79 Temp:     99.1 degrees F oral Pulse rate:   96 / minute Pulse rhythm:   regular Resp:     16 per minute BP sitting:   108 / 72  (left arm) Cuff size:   regular  Vitals Entered By: Lanier Prude, CMA(AAMA) (March 29, 2010 9:04 AM) CC: 2 mo f/u Is Patient Diabetic? No Comments pt is not taking the Prenatal vitamin, Diflucan or Beyaz. Please remove them.   Primary Care Provider:  Tresa Garter MD  CC:  2 mo f/u.  History of Present Illness: The patient presents for a follow up of hypertension, sweats, GERD C/o floaters x 6 months after BP became a problem  Current Medications (verified): 1)  Vitamin B-12 Cr 1000 Mcg  Tbcr (Cyanocobalamin) .... Take One Tablet By Mouth Daily 2)  Omeprazole 20 Mg Cpdr (Omeprazole) .... 2 Once Daily 3)  Prenatal Vitamin 4)  Diflucan 150 Mg Tabs (Fluconazole) .Marland Kitchen.. 1 By Mouth X 1 As Needed For Yeast, May Repeat As Needed Daily 5)  Beyaz 3-0.02-0.451 Mg Tabs (Drospiren-Eth Estrad-Levomefol) .Marland Kitchen.. 1 By Mouth Once Daily 6)  Hydrochlorothiazide 25 Mg Tabs (Hydrochlorothiazide) .Marland Kitchen.. 1 By Mouth Once Daily 7)  Clonazepam 1 Mg Tabs (Clonazepam) .Marland Kitchen.. 1 To 2 Odt Tabs Once Daily As Needed  Allergies (verified): 1)  Zoloft (Sertraline Hcl) 2)  Carbidopa-Levodopa (Carbidopa-Levodopa)  Past History:  Past Medical History: Last updated: 01/25/2010 Was checked for seizures w Brayton Layman. 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    Physical Exam  General:  alert, well-developed, well-nourished, pregnant, well-hydrated, appropriate dress, normal appearance, healthy-appearing, cooperative to examination, and good hygiene.   Eyes:  No corneal or conjunctival inflammation noted. EOMI. Perrla. Mouth:  Oral mucosa and oropharynx without lesions or exudates.  Teeth in good repair. Neck:  No deformities, masses, or tenderness noted. Lungs:  Normal respiratory effort, chest expands symmetrically. Lungs are clear to auscultation, no crackles or wheezes. Heart:  Normal rate and regular rhythm. S1 and S2 normal without gallop, murmur, click, rub or other extra sounds. Abdomen:  Bowel sounds positive,abdomen soft and non-tender without masses, organomegaly or hernias noted. It is large due to pregnancy Extremities:  trace left pedal edema and trace right pedal edema.   Neurologic:  No cranial nerve deficits noted. Station and gait are normal. Plantar reflexes are down-going bilaterally. DTRs are symmetrical throughout. Sensory, motor and coordinative functions appear intact.   Impression & Recommendations:  Problem # 1:  HYPERTENSION (ICD-401.9) Assessment Improved I suggested IUD for contraception The following medications were removed from the medication list:    Hydrochlorothiazide 25 Mg Tabs (Hydrochlorothiazide) .Marland Kitchen... 1 by mouth once daily Her updated medication list for this problem includes:    Spironolactone 25 Mg Tabs (Spironolactone) .Marland Kitchen... 1 by mouth qd  Problem # 2:  GERD (ICD-530.81) Assessment: Unchanged  Her  updated medication list for this problem includes:    Omeprazole 20 Mg Cpdr (Omeprazole) .Marland Kitchen... 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 2 once daily 2)  Diflucan 150 Mg Tabs (Fluconazole) .Marland Kitchen.. 1 by mouth x 1 as needed for yeast, may repeat as needed daily 3)  Beyaz 3-0.02-0.451 Mg Tabs (Drospiren-eth estrad-levomefol) .Marland Kitchen.. 1 by mouth once daily 4)  Clonazepam 1 Mg Tabs (Clonazepam) .Marland Kitchen.. 1 to 2 odt tabs once daily as needed 5)  Vitamin B-12 1000 Mcg Subl (Cyanocobalamin) .Marland Kitchen.. 1 by mouth qd 6)  Vitamin D 1000 Unit Tabs (Cholecalciferol) .Marland Kitchen.. 1 by mouth qd 7)  Spironolactone 25 Mg Tabs (Spironolactone) .Marland Kitchen.. 1 by mouth qd  Patient Instructions: 1)  Please schedule a follow-up appointment in 4 months. 2)  BMP prior to visit, ICD-9: 3)  Vit B12 266. 20   401.1 Prescriptions: SPIRONOLACTONE 25 MG TABS (SPIRONOLACTONE) 1 by mouth qd  #30 x 11   Entered and Authorized by:   Tresa Garter MD   Signed by:   Tresa Garter MD on 03/29/2010   Method used:   Print then Give to Patient   RxID:   [id number] VITAMIN D 1000 UNIT TABS (CHOLECALCIFEROL) 1 by mouth qd  #100 x 3   Entered and Authorized by:   Tresa Garter MD   Signed by:   Tresa Garter MD on 03/29/2010   Method used:   Print then Give to Patient   RxID:   [id number] VITAMIN B-12 1000 MCG SUBL (CYANOCOBALAMIN) 1 by mouth qd  #100 x 3   Entered and Authorized by:   Tresa Garter MD   Signed by:   Tresa Garter MD on 03/29/2010   Method used:   Print then Give to Patient   RxID:   [id number]    Orders Added: 1)  Est. Patient Level IV [64403]

## 2010-04-26 ENCOUNTER — Telehealth: Payer: Self-pay | Admitting: *Deleted

## 2010-04-26 NOTE — Telephone Encounter (Signed)
rec rf req for clonazepam 1 mg  1-2 qd prn # 60.........Marland Kitchen Last filled 02-16-10... Ok to Rf?

## 2010-04-27 NOTE — Telephone Encounter (Signed)
OK to fill this prescription with additional refills x3 Thank you!  

## 2010-04-28 MED ORDER — CLONAZEPAM 1 MG PO TABS: 1.0000 mg | ORAL_TABLET | Freq: Every day | ORAL | Status: DC | PRN

## 2010-05-02 ENCOUNTER — Ambulatory Visit (INDEPENDENT_AMBULATORY_CARE_PROVIDER_SITE_OTHER): Payer: 59 | Admitting: Women's Health

## 2010-05-02 ENCOUNTER — Other Ambulatory Visit (HOSPITAL_COMMUNITY)
Admission: RE | Admit: 2010-05-02 | Discharge: 2010-05-02 | Disposition: A | Discharge status: Home / Self Care | Payer: 59 | Source: Ambulatory Visit | Attending: Gynecology | Admitting: Gynecology

## 2010-05-02 ENCOUNTER — Other Ambulatory Visit: Payer: Self-pay | Admitting: Women's Health

## 2010-05-02 DIAGNOSIS — Z1159 Encounter for screening for other viral diseases: Secondary | ICD-10-CM | POA: Insufficient documentation

## 2010-05-02 DIAGNOSIS — Z124 Encounter for screening for malignant neoplasm of cervix: Secondary | ICD-10-CM | POA: Insufficient documentation

## 2010-05-02 DIAGNOSIS — Z01419 Encounter for gynecological examination (general) (routine) without abnormal findings: Secondary | ICD-10-CM

## 2010-05-26 ENCOUNTER — Ambulatory Visit: Payer: 59 | Admitting: Gynecology

## 2010-06-01 ENCOUNTER — Other Ambulatory Visit: Payer: Self-pay | Admitting: Internal Medicine

## 2010-06-02 ENCOUNTER — Encounter (INDEPENDENT_AMBULATORY_CARE_PROVIDER_SITE_OTHER): Payer: 59 | Admitting: Gynecology

## 2010-06-02 DIAGNOSIS — R87611 Atypical squamous cells cannot exclude high grade squamous intraepithelial lesion on cytologic smear of cervix (ASC-H): Secondary | ICD-10-CM

## 2010-06-03 NOTE — Discharge Summary (Signed)
Christine Duncan, Christine Duncan               ACCOUNT NO.:  [id number]   MEDICAL RECORD NO.:  [id number]          PATIENT TYPE:  INP   LOCATION:  9147                          [facility]ACILITY:  [facility]   PHYSICIAN:  Charles A. Delcambre, MDDATE OF BIRTH:  March 24, 1980   DATE OF ADMISSION:  12/28/2004  DATE OF DISCHARGE:  12/31/2004                                 DISCHARGE SUMMARY   DISCHARGE DIAGNOSES:  1.  Intrauterine pregnancy at 38 weeks and 1 day.  2.  Breech lie.  3.  Mild pregnancy-induced hypertension versus preeclampsia.   POSTOPERATIVE DIAGNOSES:  1.  Intrauterine pregnancy at 38 weeks and 1 day.  2.  Breech lie.  3.  Mild pregnancy-induced hypertension versus preeclampsia.   OPERATION/PROCEDURE:  Primary low transverse cesarean section.   DISPOSITION:  The patient discharged home to follow up in the office in 10  to 14 days. She was given convalescent instructions to call for any  temperature over 100 degrees, increased pain or bleeding, incision erythema  or drainage.  No driving for two weeks.  No lifting greater than 25 pounds  for four weeks.  Shower for two weeks.  She has requested discontinuation of  staples prior to discharge. She was given prescription for Percocet 5/325  one to two p.o. q.4h. p.r.n. #40, and she will take prenatal vitamins as she  has a prescription currently and she has over-the-counter iron. She will  continue taking this one tablet once a day.   LABORATORY DATA:  Postoperative hematocrit 24.3, hemoglobin 8.4.  She is RH  negative.  Baby was RH negative.  RhoGAM was not indicated.   OPERATIVE FINDINGS:  Baby vigorous female.  Apgars 8 and 9, 3425 g, 50 cm in  length.  Vigorous female.  Cord gas arterial 7.27, pH venous 7.35.   HISTORY AND PHYSICAL:  Dictated on the chart.   HOSPITAL COURSE:  The patient was admitted, underwent surgery as noted  above.  She had routine postoperative course.  Blood pressure was normal  during hospital course after being noted  to be 140's/90's, 142/90in the  office prior to admission, repeat 148/96.  This was prior to admission.  She  continued to do well. Convalesced without pregnancy-induced hypertension  symptoms.  Postoperatively she had no febrile morbidity. She voided without  difficulty after catheterization was discontinued postoperative day #1.  She  was given general diet the day of surgery  with maintenance of flatus.  Postoperative course she had a T-max of 99.3  postoperative day #1.  No further febrile morbidity.  Pain was controlled  with p.o. medication.  She continues to do well and was discharged home  without complications on postoperative day #3 with instructions as noted  above.      Charles A. Sydnee Cabal, MD  Electronically Signed     CAD/MEDQ  D:  12/31/2004  T:  01/02/2005  Job:  [id number]

## 2010-06-03 NOTE — H&P (Signed)
NAMEAUBRIE, Christine Duncan                           ACCOUNT NO.:  [id number]   MEDICAL RECORD NO.:  [id number]                   PATIENT TYPE:  MAT   LOCATION:  MATC                                 [facility]ILITY:  [facility]   PHYSICIAN:  Juan H. Lily Peer, M.D.             DATE OF BIRTH:  1980/03/07   DATE OF ADMISSION:  07/11/2003  DATE OF DISCHARGE:                                HISTORY & PHYSICAL   CHIEF COMPLAINT:  Abdominal cramping, vaginal bleeding.   HISTORY:  The patient is a 30 year old gravida 2 para 0 AB 1 whose last  menstrual period was reported to be May 26, 2003; currently 6 and five-  sevenths weeks gestation.  Presented to [facility] tonight complaining  of abdominal cramping and vaginal bleeding.  She has stated she had been  here in the emergency room a week ago and has been followed with  quantitative beta hCGs.  She was bleeding a week ago and received 300 mcg of  RhoGAM since she is Rh negative.  She has also been followed with  quantitative beta hCGs with the following:  On June 20 her quantitative beta  hCG was 660; on June 22 it was 693.  She had an ultrasound today which  demonstrated endometrial cavity with apparent retained products of  conception and a 1.7 cm resolving right ovarian corpus luteum cyst.  No  adnexal masses were seen.  The patient brought with her in a cup what she  thought was some products of conception but appears to be just decidual  reaction.   PAST MEDICAL HISTORY:  The patient denies any allergies.  She did say that  many years ago she had an episode of syncope but has not had any recurrence  and at that time she had a complete workup to include echocardiogram that  was reported to be normal.  The patient has not had an ultrasound until  today.  She does have history of times of irritable bowel syndrome but  otherwise negative family history for any bleeding disorders, pulmonary  disorders, or any genetic birth defects.  She denies smoking  or alcohol  consumption.   PHYSICAL EXAMINATION:  VITAL SIGNS:  Her blood pressure is 123/76,  respirations 20, pulse 88, temperature 98.7.  HEENT:  Unremarkable.  NECK:  Supple, trachea midline.  No carotid bruits, no thyromegaly.  LUNGS:  Clear to auscultation without rhonchi or wheezes.  HEART:  Regular rate and rhythm, no murmurs or gallops.  BREAST:  Exam not done.  PELVIC:  Bartholin/urethra/Skene glands within normal limits.  A significant  amount of blood present in the vaginal vault and tissue-like material  extruding from the os.  On bimanual examination the uterus felt  approximately 6-8 weeks size and no palpable adnexal masses.   LABORATORY DATA:  Her hemoglobin and hematocrit were 14.0 and 41.1  respectively with platelet count of 303,000.  Another quantitative beta  hCG  was not repeated today.  We did proceed with obtaining an antiphospholipid  antibody for IgG and IgM as well as lupus anticoagulant since this is the  patient's second pregnancy loss so that Dr. Sydnee Duncan can continue working  her up for recurrent pregnancy losses and at least we can get the initial  workup started with today's blood work as well.  Since the patient received  the RhoGAM a week ago she does not need to receive RhoGAM again.   ASSESSMENT:  A 30 year old gravida 2 para 0 now AB 2 with incomplete  abortion.  Recommended to proceed with a D&E in the operating room.  The  risks, benefits, and pros and cons to include infection, bleeding, trauma to  internal organs were discussed with the patient.  All questions were  answered and will follow accordingly.   PLAN:  The patient scheduled for emergency D&E.                                               Juan H. Lily Peer, M.D.    JHF/MEDQ  D:  07/11/2003  T:  07/11/2003  Job:  [id number]   cc:   Christine Most A. Sydnee Cabal, MD  Fax: [phone]

## 2010-06-03 NOTE — H&P (Signed)
NAMEDAVID, TOWSON               ACCOUNT NO.:  [id number]   MEDICAL RECORD NO.:  [id number]          PATIENT TYPE:  MAT   LOCATION:                                 FACILITY:   PHYSICIAN:  Charles A. Delcambre, MDDATE OF BIRTH:  06-27-1980   DATE OF ADMISSION:  12/28/2004  DATE OF DISCHARGE:                                HISTORY & PHYSICAL   REASON FOR ADMISSION:  To undergo cesarean section for breech lie and mild  preeclampsia versus  pregnancy induced hypertension.   HISTORY OF PRESENT ILLNESS:  She is a 30 year old para 0, 0, 3, 0 with EDC  January 10, 2005, at 32 weeks, 0 days today. She presented for routine  prenatal visit today complaining of headache and scotomata, mild. Relieved  somewhat with Tylenol. Blood pressure last week on December 6 was 132/82,  today on December 12, 142/90, repeat 148/96. She has no headache at this  moment or scotomata at this moment. No right upper quadrant pain. She notes  active fetal movement, and she was known to be breech lie last week. Her  cervix was 3 cm last week. She declines proceeding on with external version  attempt and induction, elects rather to proceed with a primary cesarean  section. She accepts risks of infection, bleeding, bowel or bladder damage,  blood products with risks including hepatitis and HIV exposure, ureteral  damage, incisional infection, DVT risk. All questions were answered and she  gives informed consent.   PAST MEDICAL HISTORY:  She is Rh negative. First trimester bleeding.  Recurrent SABs, hyperemesis, bipolar disorder.   PAST SURGICAL HISTORY:  D&E times one. SAB x two.   MEDICATIONS:  Prenatal vitamins and iron, Ambien 10 mg h.s. currently. No  medications for bipolar during this pregnancy and has remained stable.   ALLERGIES:  No known drug allergies.   SOCIAL HISTORY:  No tobacco, ethanol or drug use, or STD exposure in the  past. The patient is married in a monogamous relationship with her  husband.   FAMILY HISTORY:  Non specified seizure disorder in grandmother.  Hemochromatosis, emphysema, grandmother. Thyroidectomy in grandmother.  Breast cancer, colon cancer, bladder cancer in her grandmother. Bipolar  disorder in a grandfather.   REVIEW OF SYSTEMS:  As noted above in the HPI.   PHYSICAL EXAMINATION:  GENERAL:  Alert and oriented x3, in no distress.  VITAL SIGNS:  Blood pressure 148/96, weight 199 pounds (up 3 pounds from  last week). Fundal height 38 cm. Respirations 18, pulse 90, fetal heart rate  150.  HEENT EXAM:  Grossly within normal limits.  NECK:  Supple without thyromegaly or adenopathy.  LUNGS:  Clear bilaterally.  BACK:  No CVAT. Vertebral column nontender to palpation.  BREASTS:  No masses, tenderness or discharge, skin or nipple changes  bilaterally.  ABDOMEN:  Soft, nontender. Gravid. Fundal height 38 cm.  HEART:  Regular rate and rhythm, 2/6 systolic ejection murmur at the left  sternal border.  PELVIC EXAM:  From exam last week: 2-3 cm, 50%, -3 and breech.  EXTREMITIES:  One +to 2+ deep tendon reflexes  bilaterally, minimal edema  bilaterally.   ASSESSMENT:  1.  Intrauterine pregnancy at 38 weeks.  2.  Pregnancy induced hypertension versus preeclampsia.   Normal labs are noted with creatinine 0.9, uric acid 3.5, AST 22, ALT 14.  Hemoglobin 11.3, hematocrit 32.5, platelets 301,000, WBC 10.1 thousand.   Prenatal labs are O negative, antibody screen negative, VDRL nonreactive,  rubella immune, hepatitis B surface antigen immune. HIV negative. Pap  negative. GC/Chlamydia negative. Cystic fibrosis negative. TSH normal. One  hour Glucola normal. Hemoglobin 11 at 28 weeks. Group B strep negative.  Antibody screen at 36 weeks.   PLAN:  Primary C-section. Ultrasound prior to C-section to document breech.  Preoperative type and screen. N.P.O. post midnight. We will proceed as  outlined.      Charles A. Sydnee Cabal, MD  Electronically Signed      CAD/MEDQ  D:  12/27/2004  T:  12/27/2004  Job:  [id number]

## 2010-06-03 NOTE — Op Note (Signed)
Christine Duncan, Christine Duncan                           ACCOUNT NO.:  [id number]   MEDICAL RECORD NO.:  [id number]                   PATIENT TYPE:  AMB   LOCATION:  SDC                                  FACILITY:  [facility]   PHYSICIAN:  Juan H. Lily Peer, M.D.             DATE OF BIRTH:  1980/07/10   DATE OF PROCEDURE:  07/11/2003  DATE OF DISCHARGE:                                 OPERATIVE REPORT   INDICATIONS FOR PROCEDURE:  A 30 year old gravida 2, para 0 now AB 2, with  first-trimester incomplete abortion.   PREOPERATIVE DIAGNOSIS:  First-trimester incomplete abortion.   POSTOPERATIVE DIAGNOSIS:  First-trimester incomplete abortion.   ANESTHESIA:  Intravenous sedation/MAC/paracervical block with 2% Xylocaine  with 1:100,000 epinephrine.   SURGEON:  Juan H. Lily Peer, M.D.   DESCRIPTION OF PROCEDURE:  After the patient was adequately counseled, she  was taken to the operating room where she received intravenous sedation.  She was placed in the low lithotomy position.  She had received 1 g Ancef  prophylactically, and Graves speculum was inserted into the vaginal vault.  The vagina was cleansed with Betadine solution and the bladder had  previously been evacuated with a red rubber Robison for approximately 50 cc.   An exam under anesthesia had demonstrated uterus anteverted and  approximately 6-8 week size.  There were no palpable adnexal masses.   Then 2% Xylocaine with 1:100,000 epinephrine was infiltrated to the cervical  stroma at the 2, 4, 8 and 10 o'clock positions -- for a total of 16 cc.  The  cervix was dilated with a Pratt dilator to facilitate the insertion of an 8  mm suction curet.  This was done to evacuate the intrauterine cavity of its  contents.  This was interchanged with a blunted curet to completely evacuate  the intrauterine cavity.  The single-toothed tenaculum was removed; there  was good hemostasis.  The patient was transferred to the recovery room with  stable  vital signs.  Toradol 30 mg given in recovery room and 10 units of  Pitocin, and the remaining 500 cc of IV fluids of lactated Ringer's that was  present.  Total IV fluid during the operation was 700 cc of lactated  Ringer's.   Of note, the patient did receive RhoGAM 300 mcg on July 04, 2003.                                               Juan H. Lily Peer, M.D.    JHF/MEDQ  D:  07/11/2003  T:  07/12/2003  Job:  [id number]   cc:   Leonette Most A. Sydnee Cabal, MD  Fax: [phone]

## 2010-06-03 NOTE — Op Note (Signed)
Christine Duncan, Christine Duncan               ACCOUNT NO.:  [id number]   MEDICAL RECORD NO.:  [id number]          PATIENT TYPE:  INP   LOCATION:  9147                          FACILITY:  [facility]   PHYSICIAN:  Charles A. Delcambre, MDDATE OF BIRTH:  [birth year]   DATE OF PROCEDURE:  12/28/2004  DATE OF DISCHARGE:                                 OPERATIVE REPORT   PREOPERATIVE DIAGNOSES:  1.  Intrauterine pregnancy at 38 weeks and 1 day.  2.  Breach lie.  3.  Preeclampsia versus pregnancy-induced hypertension.   POSTOPERATIVE DIAGNOSES:  1.  Intrauterine pregnancy at 38 weeks and 1 day.  2.  Breach lie.  3.  Preeclampsia versus pregnancy-induced hypertension.   OPERATION/PROCEDURE:  Primary low transverse cesarean section.   SURGEON:  Charles A. Sydnee Cabal, M.D.   ASSISTANT:  None.   COMPLICATIONS:  None.   OPERATIVE FINDINGS:  Vigorous female, Apgars 8 and 9.  Cord arterial blood  gas PH 7.27, venous blood gas PH 7.35.   SPECIMENS:  Placenta to pathology.   ESTIMATED BLOOD LOSS:  500 mL   ANESTHESIA:  Spinal.  l   COUNTS:  Instrument, sponge and needle counts correct x2.   DESCRIPTION OF PROCEDURE:  The patient was taken to the operating room,  placed in the supine position.  After spinal was induced, the anesthesia was  adequate.  Sterile prep and drape was undertaken.  A Pfannenstiel incision  was made with the knife and carried down to the fascia.  The fascia was  incised with the knife and Mayo scissors.  Rectus sheath was sharply  released superiorly and inferiorly sharply.  Rectus muscle was sharply  dissected in the midline.  Peritoneum was entered with hemostat.  Traction  was used to extend this incision.  Bladder blade was placed.  Vesicouterine  peritoneum was incised with the Metzenbaum scissors.  Blunt dissection was  used to develop the bladder flap.  Bladder blade was replaced.  The lower  uterine segment transverse incision was made to the amniotomy without damage  to  the infant.  Traction was used to extend the incision.  Hand was  inserted.  Breech was lifted to the uterine incision set.  Gentle fundal  pressure by the operator assistant was used to deliver the breech.  Legs  were flexed and delivered without difficulty.  Further fundal pressure was  used to deliver down to the scapular region.  Arms were flexed without  difficulty and the head spontaneously delivered at that point with further  gentle fundal pressure.  Cord was clamped.  The instrument was shown to the  parents and handed off to the neonatologist present for delivery.  The  placenta was manually expressed.  Cord blood samples were taken.  Uterus was  externalized for repair.  Internal surfaces were wiped with a moistened lap.  Uterine incision was closed with #1 chromic running locking first layer and  second running imbricating over the first layer nonlocking suture of #1  chromic.  The bladder flap was with good hemostasis.  There was no evidence  of injury to the bladder  noted.  Peritoneum of the bladder flap serosa and  parietal peritoneum on the uterus over the uterine incision site was closed  closing the bladder flap with 2-0 Vicryl.  Uterus was reinternalized.  Irrigation was carried out.  Pericolic gutters were irrigated of blood  clots.  Hemostasis was verified at the uterine incision and bladder flap  site.  Subfascial hemostasis was excellent after minor electrocautery.  Fascia was then closed with #1 Vicryl running locking suture.  A  subcutaneous 2-0 Vicryl interrupted suture x3 was used to close the  subcutaneous lower. After irrigation was carried out, hemostasis was  excellent.  Sterile skin clips were used to close the skin.  Sterile  dressing was applied.  The patient was taken to recovery with the physician  in attendance having tolerated the procedure well.      Charles A. Sydnee Cabal, MD  Electronically Signed     CAD/MEDQ  D:  12/28/2004  T:  12/28/2004   Job:  [id number]

## 2010-06-09 NOTE — Progress Notes (Deleted)
Subjective:     Patient ID: Christine Duncan, female   DOB: Oct 04, 1980, 30 y.o.   MRN: [id number]  HPI   Review of Systems     Objective:   Physical Exam     Assessment:     ***    Plan:     ***

## 2010-06-10 ENCOUNTER — Ambulatory Visit (INDEPENDENT_AMBULATORY_CARE_PROVIDER_SITE_OTHER): Payer: 59 | Admitting: Gynecology

## 2010-06-10 DIAGNOSIS — N871 Moderate cervical dysplasia: Secondary | ICD-10-CM

## 2010-06-14 ENCOUNTER — Ambulatory Visit (INDEPENDENT_AMBULATORY_CARE_PROVIDER_SITE_OTHER): Payer: 59 | Admitting: Internal Medicine

## 2010-06-14 ENCOUNTER — Encounter: Payer: Self-pay | Admitting: Internal Medicine

## 2010-06-14 VITALS — BP 110/62 | HR 88 | Temp 98.6°F | Ht 65.0 in | Wt 165.8 lb

## 2010-06-14 DIAGNOSIS — R42 Dizziness and giddiness: Secondary | ICD-10-CM

## 2010-06-14 MED ORDER — MECLIZINE HCL 12.5 MG PO TABS: 12.5000 mg | ORAL_TABLET | Freq: Three times a day (TID) | ORAL | Status: DC | PRN

## 2010-06-14 NOTE — Progress Notes (Signed)
  Subjective:    Patient ID: Christine Duncan, female    DOB: [birth year], 30 y.o.   MRN: [id number]  HPI  complains of dizziness Onset 1 week ago - intermittent symptoms  Describes as "lightheaded" -  Feels achey, tired and mild HA (until yesterday, no HA today) no ear pain, vision changes or fever - no head trauma or falls Denies med changes -  symptoms worse lying in bed or rolling over in bed  Past Medical History  Diagnosis Date  . Blood type, Rh negative   . SAB (spontaneous abortion)     3 times  . ASCUS (atypical squamous cells of undetermined significance) on Pap smear     cannot r/o hgsil   . History of colposcopy with cervical biopsy 2012  . 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 IBS (irritable bowel syndrome)   . Acid reflux   . Bipolar 1 disorder   . PIH (pregnancy induced hypertension), antepartum     icu after delivery and currently hypertensive  . B12 deficiency      Review of Systems  Eyes: Negative for visual disturbance.  Respiratory: Negative for cough and shortness of breath.   Cardiovascular: Negative for chest pain.  Musculoskeletal: Negative for gait problem.  Neurological: Negative for tremors.       Objective:   Physical Exam BP 110/62  Pulse 88  Temp(Src) 98.6 F (37 C) (Oral)  Ht 5\' 5"  (1.651 m)  Wt 165 lb 12.8 oz (75.206 kg)  BMI 27.59 kg/m2  SpO2 97% Physical Exam  Constitutional: She is oriented to person, place, and time. She appears well-developed and well-nourished. No distress.  Eyes: no nystagmus; ENT - B TMs ok, no effusion or erythema, no cerumen - OP clear Neck: Normal range of motion. Neck supple. No JVD present. No thyromegaly present.  Cardiovascular: Normal rate, regular rhythm and normal heart sounds.  No murmur heard. No BLE edema. Pulmonary/Chest: Effort normal and breath sounds normal. No respiratory distress. She has no wheezes.  Neurological: She is alert and oriented to person, place, and time. No cranial nerve deficit.  Coordination normal. Gait normal - Strength intact Psychiatric: She has a normal mood and affect. Her behavior is normal. Judgment and thought content normal.        Lab Results  Component Value Date   WBC 8.1 01/21/2010   HGB 13.6 01/21/2010   HCT 40.0 01/21/2010   PLT 262 01/21/2010   ALT 47* 10/15/2009   AST 20 10/15/2009   NA 140 03/24/2010   K 3.4* 03/24/2010   CL 99 03/24/2010   CREATININE 0.86 03/24/2010   BUN 14 03/24/2010   CO2 30 03/24/2010   TSH 0.413 03/24/2010     Assessment & Plan:  Vertigo - suspect viral etiology - help control symptoms with prn meclizine - erx done - no role for antibiotics or need for imaging at present but pt to call if change in symptoms or unimproved - discussed dx and tx - hold aldactone for next 7 days until symptoms improved but to low normal BP today off tx.

## 2010-06-14 NOTE — Patient Instructions (Signed)
It was good to see you today. Use meclizine for dizziness as discussed - Your prescription(s) have been submitted to your pharmacy. Please take as directed and contact our office if you believe you are having problem(s) with the medication(s). Hold blood pressure medication x 1 week, then resume as before Please keep scheduled follow up with [facility] as planned, call sooner if problems.

## 2010-07-14 ENCOUNTER — Telehealth: Payer: Self-pay | Admitting: *Deleted

## 2010-07-14 MED ORDER — DIPHENHYDRAMINE HCL 25 MG PO TABS: 25.0000 mg | ORAL_TABLET | Freq: Every day | ORAL | Status: AC

## 2010-07-14 MED ORDER — LORATADINE 10 MG PO TABS: 10.0000 mg | ORAL_TABLET | ORAL | Status: DC

## 2010-07-14 NOTE — Telephone Encounter (Signed)
Patient requesting RX to help with recurrence of hives. Says MD is aware of this problem.

## 2010-07-14 NOTE — Telephone Encounter (Signed)
Pt says that both never help and she "always" gets prednisone pack. OK for this? Per pt, this is not allergy related, usually from stress and she is taking klonopin as directed but would like prednisone.

## 2010-07-14 NOTE — Telephone Encounter (Signed)
Loratidine 10 mg q am Benadryl 25 mg qhs OV if problems Thx

## 2010-07-15 MED ORDER — PREDNISONE 10 MG PO TABS: ORAL_TABLET | ORAL | Status: DC

## 2010-07-15 NOTE — Telephone Encounter (Signed)
Ok  Prednisone 10 mg: take 4 tabs a day x 3 days; then 3 tabs a day x 4 days; then 2 tabs a day x 4 days, then 1 tab a day x 6 days, then stop. Take pc.   ---done   Thx

## 2010-07-17 HISTORY — PX: LEEP: SHX91

## 2010-07-18 MED ORDER — PREDNISONE 10 MG PO TABS: ORAL_TABLET | ORAL | Status: AC

## 2010-07-18 NOTE — Telephone Encounter (Signed)
Pt informed

## 2010-07-27 ENCOUNTER — Other Ambulatory Visit: Payer: 59

## 2010-07-27 ENCOUNTER — Other Ambulatory Visit: Payer: Self-pay | Admitting: Internal Medicine

## 2010-07-27 DIAGNOSIS — E538 Deficiency of other specified B group vitamins: Secondary | ICD-10-CM

## 2010-07-27 DIAGNOSIS — I10 Essential (primary) hypertension: Secondary | ICD-10-CM

## 2010-07-28 ENCOUNTER — Encounter: Payer: Self-pay | Admitting: Internal Medicine

## 2010-07-28 NOTE — Progress Notes (Signed)
This encounter was created in error - please disregard.

## 2010-07-29 ENCOUNTER — Ambulatory Visit: Payer: 59 | Admitting: Internal Medicine

## 2010-08-02 ENCOUNTER — Encounter: Payer: Self-pay | Admitting: Internal Medicine

## 2010-08-02 ENCOUNTER — Ambulatory Visit (INDEPENDENT_AMBULATORY_CARE_PROVIDER_SITE_OTHER): Payer: 59 | Admitting: Internal Medicine

## 2010-08-02 DIAGNOSIS — L509 Urticaria, unspecified: Secondary | ICD-10-CM

## 2010-08-02 DIAGNOSIS — L719 Rosacea, unspecified: Secondary | ICD-10-CM

## 2010-08-02 DIAGNOSIS — R55 Syncope and collapse: Secondary | ICD-10-CM

## 2010-08-02 DIAGNOSIS — J069 Acute upper respiratory infection, unspecified: Secondary | ICD-10-CM

## 2010-08-02 DIAGNOSIS — N879 Dysplasia of cervix uteri, unspecified: Secondary | ICD-10-CM

## 2010-08-02 DIAGNOSIS — I10 Essential (primary) hypertension: Secondary | ICD-10-CM

## 2010-08-02 MED ORDER — AZITHROMYCIN 250 MG PO TABS: 250.0000 mg | ORAL_TABLET | Freq: Every day | ORAL | Status: AC

## 2010-08-02 MED ORDER — FLUCONAZOLE 150 MG PO TABS: 150.0000 mg | ORAL_TABLET | Freq: Once | ORAL | Status: AC

## 2010-08-02 MED ORDER — TRETINOIN 0.05 % EX CREA: TOPICAL_CREAM | Freq: Every day | CUTANEOUS | Status: DC

## 2010-08-02 NOTE — Assessment & Plan Note (Signed)
Retin A 

## 2010-08-02 NOTE — Assessment & Plan Note (Signed)
Zpac 

## 2010-08-02 NOTE — Assessment & Plan Note (Signed)
Better  

## 2010-08-02 NOTE — Progress Notes (Signed)
  Subjective:    Patient ID: Christine Duncan, female    DOB: [birth year], 30 y.o.   MRN: [id number]  HPI  F/u HTN - good most of the time w/o Rx C/o fever and fatigue x 1 wk C/o rash on face  Review of Systems  Constitutional: Negative for chills, activity change, appetite change, fatigue and unexpected weight change.  HENT: Negative for congestion, mouth sores and sinus pressure.   Eyes: Negative for visual disturbance.  Respiratory: Negative for cough and chest tightness.   Gastrointestinal: Negative for nausea and abdominal pain.  Genitourinary: Negative for frequency, difficulty urinating and vaginal pain.  Musculoskeletal: Negative for back pain and gait problem.  Skin: Positive for rash (rosacea). Negative for pallor.       hives  Neurological: Negative for dizziness, tremors, weakness, numbness and headaches.  Psychiatric/Behavioral: Negative for suicidal ideas, confusion, sleep disturbance and dysphoric mood.   Wt Readings from Last 3 Encounters:  08/02/10 171 lb (77.565 kg)  06/14/10 165 lb 12.8 oz (75.206 kg)  03/29/10 169 lb (76.658 kg)   BP Readings from Last 3 Encounters:  08/02/10 108/80  06/14/10 110/62  03/29/10 108/72         Objective:   Physical Exam  Constitutional: She appears well-developed and well-nourished. No distress.  HENT:  Head: Normocephalic.  Right Ear: External ear normal.  Left Ear: External ear normal.  Nose: Nose normal.  Mouth/Throat: Oropharynx is clear and moist.  Eyes: Conjunctivae are normal. Pupils are equal, round, and reactive to light. Right eye exhibits no discharge. Left eye exhibits no discharge.  Neck: Normal range of motion. Neck supple. No JVD present. No tracheal deviation present. No thyromegaly present.  Cardiovascular: Normal rate, regular rhythm and normal heart sounds.   Pulmonary/Chest: No stridor. No respiratory distress. She has no wheezes.  Abdominal: Soft. Bowel sounds are normal. She exhibits no distension  and no mass. There is no tenderness. There is no rebound and no guarding.  Musculoskeletal: She exhibits no edema and no tenderness.  Lymphadenopathy:    She has no cervical adenopathy.  Neurological: She displays normal reflexes. No cranial nerve deficit. She exhibits normal muscle tone. Coordination normal.  Skin: No rash noted. There is erythema (rosacea).  Psychiatric: She has a normal mood and affect. Her behavior is normal. Judgment and thought content normal.          Assessment & Plan:

## 2010-08-02 NOTE — Assessment & Plan Note (Signed)
On prn Rx incl Prednisone

## 2010-08-03 ENCOUNTER — Encounter: Payer: Self-pay | Admitting: Internal Medicine

## 2010-08-12 ENCOUNTER — Ambulatory Visit (INDEPENDENT_AMBULATORY_CARE_PROVIDER_SITE_OTHER): Payer: 59 | Admitting: Gynecology

## 2010-08-12 ENCOUNTER — Encounter: Payer: Self-pay | Admitting: Gynecology

## 2010-08-12 VITALS — BP 120/70

## 2010-08-12 DIAGNOSIS — N871 Moderate cervical dysplasia: Secondary | ICD-10-CM

## 2010-08-12 MED ORDER — LIDOCAINE-EPINEPHRINE 2 %-1:100000 IJ SOLN
8.0000 mL | Freq: Once | INTRAMUSCULAR | Status: AC
  Administered 2010-08-12: 8 mL

## 2010-08-12 NOTE — Progress Notes (Signed)
The patient and I reviewed her indications for LEEP:  First abnormal Pap smear showing ASCUS H. No history of abnormalities before. Colposcopic directed biopsy shows high-grade SIL.  She was counseled as to the options per my 06/10/2010 note and elects for LEEP biopsy.  I reviewed the procedure with her to include the risks of infection, bleeding requiring retreatment and damage to surrounding tissues including vagina, bladder and rectum. I reviewed the possible risks of infertility, miscarriage and preterm delivery associated with cervical treatments.  The various pathology result possibilities were reviewed with her to include pathology found with complete excision, cut through lesions with need for possible future treatments, as well as no pathology found.  She understands that these lesions are HPV related and that we are not eradicating the virus, with possibilities of recurrences in the future. Patient understands and accepts the above.  Procedure:  A pelvic exam was performed and was normal.  The cervix was visualized with a LEEP speculum cleansed with acetic acid and re-colposcopy performed. The cervix was circumferentially injected with 2% lidocaine solution with epinephrine 1 / 100,000 dilution, a total of 8cc were used. The cervix was then stained with Lugol's solution, which allowed for visualization of the transformation zone grossly. The LEEP generator was set at 40 W cutting 60 W coagulation blend one current. Using the 12 x 20 LEEP wand, the LEEP specimen was excised in a single pass.  Ball coagulation was delivered to the LEEP base to achieve hemostasis and prophylactic Monsel's solution was applied. The specimen was cut open at 3 o'clock and pinned open on the cork and sent to pathology. The patient tolerated the procedure well and postoperative instructions were discussed with her and a postoperative instruction sheet was given to her. Patient knows to follow up for pathology results in  several days and then we will discuss the long-term plan as far as follow up screening.  I reviewed with her that generally she would receive every 6 months Pap smears for 2 years unless surprise findings on the LEEP that would require further treatment sooner.

## 2010-08-12 NOTE — Patient Instructions (Signed)
As discussed

## 2010-08-15 ENCOUNTER — Encounter: Payer: Self-pay | Admitting: Internal Medicine

## 2010-08-15 ENCOUNTER — Ambulatory Visit (INDEPENDENT_AMBULATORY_CARE_PROVIDER_SITE_OTHER): Payer: 59 | Admitting: Internal Medicine

## 2010-08-15 ENCOUNTER — Encounter: Payer: Self-pay | Admitting: *Deleted

## 2010-08-15 VITALS — BP 122/84 | HR 116 | Temp 100.9°F | Ht 65.0 in

## 2010-08-15 DIAGNOSIS — B9789 Other viral agents as the cause of diseases classified elsewhere: Secondary | ICD-10-CM

## 2010-08-15 DIAGNOSIS — B349 Viral infection, unspecified: Secondary | ICD-10-CM

## 2010-08-15 DIAGNOSIS — L508 Other urticaria: Secondary | ICD-10-CM

## 2010-08-15 MED ORDER — PREDNISONE (PAK) 10 MG PO TABS: 10.0000 mg | ORAL_TABLET | ORAL | Status: AC

## 2010-08-15 NOTE — Progress Notes (Signed)
  Subjective:    Patient ID: Christine Duncan, female    DOB: [birth year], 30 y.o.   MRN: [id number]  HPI  complains of hives - hx same, recurrent with stress/friction associated with with fatigue, myalgias and chills Onset 2 days ago ?precipitated by LEEP procedure 3 days ago Denies sore throat, shortness of breath, cough, dysuria or hematuria  Past Medical History  Diagnosis Date  . Blood type, Rh negative   . SAB (spontaneous abortion)     3 times  . ASCUS (atypical squamous cells of undetermined significance) on Pap smear     cannot r/o hgsil   . History of colposcopy with cervical biopsy 2012  . 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 IBS (irritable bowel syndrome)   . Acid reflux   . Bipolar 1 disorder   . PIH (pregnancy induced hypertension), antepartum     icu after delivery and currently hypertensive  . B12 deficiency    Review of Systems  Constitutional: Negative for diaphoresis.  HENT: Negative for ear pain.   Eyes: Negative for redness.  Cardiovascular: Negative for leg swelling.  Genitourinary: Negative for flank pain.  Neurological: Negative for headaches.      Objective:   Physical Exam BP 122/84  Pulse 116  Temp(Src) 100.9 F (38.3 C) (Oral)  Ht 5\' 5"  (1.651 m)  SpO2 97%  LMP 07/31/2010  Constitutional: She is oriented to person, place, and time. She appears well-developed and well-nourished. Mildly ill, fatigued  HENT: Head: Normocephalic and atraumatic. Ears; B TMs ok, no erythema or effusion; Nose: Nose normal.  Mouth/Throat: Oropharynx is clear and moist. No oropharyngeal exudate.  Eyes: Conjunctivae and EOM are normal. Pupils are equal, round, and reactive to light. No scleral icterus.  Neck: Normal range of motion. Neck supple. No JVD present. No thyromegaly present.  mild anterior LAD Cardiovascular: Normal rate, regular rhythm and normal heart sounds.  No murmur heard. No BLE edema. Pulmonary/Chest: Effort normal and breath sounds normal. No  respiratory distress. She has no wheezes.  Neurological: She is alert and oriented to person, place, and time. No cranial nerve deficit. Coordination normal.  Skin: Urticaria involving forehead, anterior neck, anterior chest and lower back    Lab Results  Component Value Date   WBC 8.1 01/21/2010   HGB 13.6 01/21/2010   HCT 40.0 01/21/2010   PLT 262 01/21/2010   ALT 47* 10/15/2009   AST 20 10/15/2009   NA 140 03/24/2010   K 3.4* 03/24/2010   CL 99 03/24/2010   CREATININE 0.86 03/24/2010   BUN 14 03/24/2010   CO2 30 03/24/2010   TSH 0.413 03/24/2010       Assessment & Plan:   Myalgias/LGF - suspect viral infection - symptomatic treatment recommended with tyelnol and hydration - no antibiotics indicated at this time - offered work note and pt declines  Hives, recurrent with hx same - pred pak rx done today

## 2010-08-15 NOTE — Patient Instructions (Signed)
It was good to see you today. If you develop worsening symptoms or fever, call and we can reconsider antibiotics, but it does not appear necessary to use antibiotics at this time. Tylenol and aggressive hydration recommended to help ache and chills Pred pak for your hives - Your prescription(s) have been submitted to your pharmacy. Please take as directed and contact our office if you believe you are having problem(s) with the medication(s).

## 2010-08-22 ENCOUNTER — Encounter: Payer: Self-pay | Admitting: Gynecology

## 2010-08-22 ENCOUNTER — Telehealth: Payer: Self-pay | Admitting: Gynecology

## 2010-08-22 NOTE — Telephone Encounter (Signed)
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 Patient is to follow up for Pap smear in 6 months as we have discussed at her visit.

## 2010-08-22 NOTE — Telephone Encounter (Signed)
PT INFORMED WITH THE BELOW NOTE. RECALL LETTER IN COMPUTER.

## 2010-09-07 ENCOUNTER — Encounter: Payer: Self-pay | Admitting: Gynecology

## 2010-10-21 LAB — DIFFERENTIAL
Basophils Absolute: 0 10*3/uL (ref 0.0–0.1)
Basophils Relative: 0 % (ref 0–1)
Eosinophils Relative: 1 % (ref 0–5)
Monocytes Absolute: 0.5 10*3/uL (ref 0.1–1.0)
Monocytes Relative: 7 % (ref 3–12)

## 2010-10-21 LAB — BASIC METABOLIC PANEL
CO2: 26 mEq/L (ref 19–32)
Chloride: 105 mEq/L (ref 96–112)
Glucose, Bld: 82 mg/dL (ref 70–99)
Potassium: 3.8 mEq/L (ref 3.5–5.1)
Sodium: 141 mEq/L (ref 135–145)

## 2010-10-21 LAB — URINALYSIS, ROUTINE W REFLEX MICROSCOPIC
Ketones, ur: NEGATIVE mg/dL
Nitrite: NEGATIVE
Specific Gravity, Urine: 1.012 (ref 1.005–1.030)
pH: 6 (ref 5.0–8.0)

## 2010-10-21 LAB — PREGNANCY, URINE: Preg Test, Ur: NEGATIVE

## 2010-10-21 LAB — CBC
HCT: 41.7 % (ref 36.0–46.0)
Hemoglobin: 14.3 g/dL (ref 12.0–15.0)
MCHC: 34.3 g/dL (ref 30.0–36.0)
MCV: 87.3 fL (ref 78.0–100.0)
RBC: 4.77 MIL/uL (ref 3.87–5.11)
RDW: 11.2 % — ABNORMAL LOW (ref 11.5–15.5)

## 2010-10-21 LAB — GLUCOSE, CAPILLARY: Glucose-Capillary: 81 mg/dL (ref 70–99)

## 2010-10-25 ENCOUNTER — Telehealth: Payer: Self-pay | Admitting: *Deleted

## 2010-10-25 NOTE — Telephone Encounter (Signed)
Pt called and said having pain in vagina on right side while using tampons. She wants an OV so therefore apt made. I gave her the option of waiting til next cycle to see if pain continues since its only while using a tampon. She said no. She also wants to discuss going on OCP continuous ly to have no period Kw

## 2010-10-27 ENCOUNTER — Ambulatory Visit (INDEPENDENT_AMBULATORY_CARE_PROVIDER_SITE_OTHER): Payer: 59 | Admitting: Gynecology

## 2010-10-27 ENCOUNTER — Encounter: Payer: Self-pay | Admitting: Gynecology

## 2010-10-27 VITALS — BP 110/80

## 2010-10-27 DIAGNOSIS — N949 Unspecified condition associated with female genital organs and menstrual cycle: Secondary | ICD-10-CM

## 2010-10-27 DIAGNOSIS — B373 Candidiasis of vulva and vagina: Secondary | ICD-10-CM

## 2010-10-27 DIAGNOSIS — R102 Pelvic and perineal pain: Secondary | ICD-10-CM

## 2010-10-27 DIAGNOSIS — N898 Other specified noninflammatory disorders of vagina: Secondary | ICD-10-CM

## 2010-10-27 DIAGNOSIS — Z309 Encounter for contraceptive management, unspecified: Secondary | ICD-10-CM

## 2010-10-27 DIAGNOSIS — B3731 Acute candidiasis of vulva and vagina: Secondary | ICD-10-CM

## 2010-10-27 MED ORDER — NORETHIN ACE-ETH ESTRAD-FE 1-20 MG-MCG PO TABS: 1.0000 | ORAL_TABLET | Freq: Every day | ORAL | Status: DC

## 2010-10-27 MED ORDER — FLUCONAZOLE 150 MG PO TABS: 150.0000 mg | ORAL_TABLET | Freq: Once | ORAL | Status: AC

## 2010-10-27 NOTE — Progress Notes (Signed)
Patient presents with 2 issues. She is status post LEEP August 2012 with clear margins. She's had several periods since then has done well except with this last. She's had some superficial discomfort with tampon insertion. No discharge itching urinary symptoms. Has not had intercourse since the tampon issue. She also wants to start low-dose oral contraceptives and she has used in the past continuously for contraception and menstrual suppression. She had smoked a number of years ago but has quit. She had been on spironolactone postpartum for hypertension but this has been discontinued and her blood pressures have reportedly been normal.  Blood pressure 110/80 Abdomen: Soft nontender without masses guarding rebound organomegaly Pelvic: External BUS vagina with white discharge no palpable abnormalities or tenderness on vaginal palpation, cervix healed nicely, uterus anteverted midline mobile nontender, adnexa without masses or tenderness, or rectovaginal exam normal.  Assessment and plan: 1. Vaginal pain with tampon insertion. KOH wet prep is positive for yeast we'll treat with Diflucan 150x1 dose. Her exam otherwise is normal she will monitor her discomfort if continues will represent for further evaluation. 2. Birth control. Patient desires low dose oral contraceptives which she has used in the past. She does have a history of hypertension treated with spironolactone but this has been discontinued and her blood pressure is normal. She had previously smoked but quit a number of years ago. I reviewed the risks of low-dose oral contraceptives to include stroke heart attack DVT. She understands accepts these risks wants to start and I prescribed Loestrin 120 equivalent for continuous use off brand labeling reviewed 6 refills. She is due for her annual in April were monitored followed for this but did ask her to come back in one to 2 months after starting the birth control pills to let [place] recheck her blood  pressure and she agrees to do so.

## 2010-10-27 NOTE — Patient Instructions (Signed)
As discussed at visit.

## 2010-11-07 ENCOUNTER — Telehealth: Payer: Self-pay | Admitting: *Deleted

## 2010-11-07 NOTE — Telephone Encounter (Signed)
OK to fill this prescription with additional refills x2 Thank you!  

## 2010-11-07 NOTE — Telephone Encounter (Signed)
Pharm faxed RF request -  Klonopin

## 2010-11-08 MED ORDER — CLONAZEPAM 1 MG PO TABS: 1.0000 mg | ORAL_TABLET | Freq: Every day | ORAL | Status: DC | PRN

## 2010-11-10 ENCOUNTER — Ambulatory Visit: Payer: 59 | Admitting: Internal Medicine

## 2010-11-10 DIAGNOSIS — Z0289 Encounter for other administrative examinations: Secondary | ICD-10-CM

## 2010-12-27 ENCOUNTER — Telehealth: Payer: Self-pay | Admitting: Internal Medicine

## 2010-12-27 MED ORDER — PREDNISONE 10 MG PO TABS: ORAL_TABLET | ORAL | Status: DC

## 2010-12-27 NOTE — Telephone Encounter (Signed)
Ok - to call in F/u OV

## 2010-12-27 NOTE — Telephone Encounter (Signed)
The pt called triage and stated she has broken out in hives over her face, arms, scalp, and butt.  She stated benadryl is not helping.  She is hoping a steroid in pill form could be called into the [facility] (she stated she could not come in for a shot).  Can this be called in?    Thanks!!

## 2010-12-27 NOTE — Telephone Encounter (Signed)
Pt informed

## 2011-01-11 ENCOUNTER — Encounter: Payer: Self-pay | Admitting: Internal Medicine

## 2011-01-11 ENCOUNTER — Ambulatory Visit (INDEPENDENT_AMBULATORY_CARE_PROVIDER_SITE_OTHER): Payer: 59 | Admitting: Internal Medicine

## 2011-01-11 DIAGNOSIS — J209 Acute bronchitis, unspecified: Secondary | ICD-10-CM

## 2011-01-11 DIAGNOSIS — L509 Urticaria, unspecified: Secondary | ICD-10-CM

## 2011-01-11 MED ORDER — PROMETHAZINE-CODEINE 6.25-10 MG/5ML PO SYRP: 5.0000 mL | ORAL_SOLUTION | ORAL | Status: AC | PRN

## 2011-01-11 MED ORDER — METHYLPREDNISOLONE ACETATE 80 MG/ML IJ SUSP
120.0000 mg | Freq: Once | INTRAMUSCULAR | Status: AC
  Administered 2011-01-11: 120 mg via INTRAMUSCULAR

## 2011-01-11 MED ORDER — CLARITHROMYCIN 500 MG PO TABS: 500.0000 mg | ORAL_TABLET | Freq: Two times a day (BID) | ORAL | Status: AC

## 2011-01-11 NOTE — Progress Notes (Signed)
  Subjective:    HPI  complains of cold symptoms  Complicated by ongoing hives flare x2 weeks Onset cough >1 week ago, wax/wane symptoms  Initially associated with rhinorrhea, sneezing, sore throat, mild headache and low grade fever now myalgias and mild-mod chest congestion No relief with OTC meds Precipitated by sick contacts  Past Medical History  Diagnosis Date  . Blood type, Rh negative   . SAB (spontaneous abortion)     3 times  . ASCUS (atypical squamous cells of undetermined significance) on Pap smear     cannot r/o hgsil   . History of colposcopy with cervical biopsy 2012  . 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 IBS (irritable bowel syndrome)   . Acid reflux   . Bipolar 1 disorder   . PIH (pregnancy induced hypertension), antepartum     icu after delivery and currently hypertensive  . B12 deficiency     Review of Systems Constitutional: No night sweats, no unexpected weight change Pulmonary: No pleurisy or hemoptysis Cardiovascular: No chest pain or palpitations     Objective:   Physical Exam BP 110/80  Pulse 129  Temp(Src) 99.2 F (37.3 C) (Oral)  SpO2 97% GEN: mildly ill appearing and audible head/chest congestion HENT: NCAT, no sinus tenderness bilaterally, nares with clear discharge, oropharynx mild erythema, no exudate Eyes: Vision grossly intact, no conjunctivitis Lungs: Bilateral rhonchi , no wheeze, no increased work of breathing between cough spasms Cardiovascular: Regular rate and rhythm, no bilateral edema Skin: Scattered urticaria hives across face and neck including swelling of left pinna, anterior chest and torso - no ulceration      Assessment & Plan:  Viral URI >>now progression to acute bronchitis Cough, related to above Urticaria, chronic and idiopathic with acute flare over past 2 weeks   Empiric antibiotics prescribed due to symptom duration greater than 7 days Prescription cough suppression - new prescriptions done Symptomatic care  with Tylenol or Advil, hydration and rest -  salt gargle advised as needed IM Medrol shot for recurrent hives

## 2011-01-11 NOTE — Patient Instructions (Signed)
It was good to see you today. Given your steroid injection today for hives as discussed Biaxin antibiotics and prescription cough syrup for bronchitis symptoms - Your prescription(s) have been submitted to your pharmacy. Please take as directed and contact our office if you believe you are having problem(s) with the medication(s). Alternate between ibuprofen and tylenol for aches, pain and fever symptoms as discussed

## 2011-01-25 ENCOUNTER — Telehealth: Payer: Self-pay | Admitting: *Deleted

## 2011-01-25 NOTE — Telephone Encounter (Signed)
OK to fill this prescription with additional refills x0 Thank you!  

## 2011-01-25 NOTE — Telephone Encounter (Signed)
Seen Dr. Felicity Coyer on 12.26.12 for Bronchitis; still has cough, requesting [1] refill on cough medication prescribed.

## 2011-01-26 ENCOUNTER — Other Ambulatory Visit: Payer: Self-pay | Admitting: *Deleted

## 2011-01-26 MED ORDER — PROMETHAZINE-CODEINE 6.25-10 MG/5ML PO SYRP: 5.0000 mL | ORAL_SOLUTION | ORAL | Status: DC | PRN

## 2011-01-26 MED ORDER — PROMETHAZINE-CODEINE 6.25-10 MG/5ML PO SYRP: 5.0000 mL | ORAL_SOLUTION | ORAL | Status: AC | PRN

## 2011-01-26 NOTE — Telephone Encounter (Signed)
Rx Done. LMOM to inform patient.

## 2011-02-01 ENCOUNTER — Ambulatory Visit: Payer: 59 | Admitting: Gynecology

## 2011-02-08 ENCOUNTER — Encounter: Payer: Self-pay | Admitting: Gynecology

## 2011-02-08 ENCOUNTER — Other Ambulatory Visit: Payer: Self-pay | Admitting: Gynecology

## 2011-02-08 ENCOUNTER — Ambulatory Visit (INDEPENDENT_AMBULATORY_CARE_PROVIDER_SITE_OTHER): Payer: 59 | Admitting: Gynecology

## 2011-02-08 DIAGNOSIS — N871 Moderate cervical dysplasia: Secondary | ICD-10-CM

## 2011-02-08 DIAGNOSIS — N951 Menopausal and female climacteric states: Secondary | ICD-10-CM

## 2011-02-08 NOTE — Patient Instructions (Signed)
Follow up for Pap smear results. Otherwise schedule annual exam with follow Pap smear in 6 months

## 2011-02-08 NOTE — Progress Notes (Signed)
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 No significant hair skin changes or weight changes. Has been treated with several doses of steroids over time to to her urticaria. She is on continuous low-dose oral contraceptives with no menses.  Exam was Christine Duncan chaperone present HEENT normal with no evidence of thyroid abnormalities Lungs clear bilaterally without rales rhonchi wheezing Cardiac regular rate no rubs murmurs or gallops Abdomen soft nontender without masses guarding rebound organomegaly Pelvic external BUS vagina normal. Cervix with LEEP changes Pap done. Uterus normal size midline mobile nontender. Adnexa without masses or tenderness.  Assessment and plan: #1 history of CIN-2 margins clear with LEEP July 2012. Pap done today if normal will repeat in 6 months at her annual exam if abnormal then we'll triage based on results. #2 hot flushes/sweats. Check TSH and FSH although being on continuous low-dose contraceptives I doubt this is an estrogen-related event.  I suspect this is due to her underlying issue with her urticaria and possible steroid use. If hormone levels are normal she'll follow up with her primary for further evaluation.

## 2011-02-13 LAB — PAP IG (IMAGE GUIDED)

## 2011-02-23 ENCOUNTER — Telehealth: Payer: Self-pay | Admitting: *Deleted

## 2011-02-23 NOTE — Telephone Encounter (Signed)
Rf req for Clonazepam 1 mg. Last filled 01/24/11. Ok to Rf?

## 2011-02-24 NOTE — Telephone Encounter (Signed)
OK to fill this prescription with additional refills x2 Sch OV Thank you!  

## 2011-02-25 MED ORDER — CLONAZEPAM 1 MG PO TABS: 1.0000 mg | ORAL_TABLET | Freq: Every day | ORAL | Status: DC | PRN

## 2011-04-02 ENCOUNTER — Other Ambulatory Visit: Payer: Self-pay | Admitting: Gynecology

## 2011-04-25 ENCOUNTER — Other Ambulatory Visit: Payer: Self-pay | Admitting: Internal Medicine

## 2011-05-19 ENCOUNTER — Telehealth: Payer: Self-pay

## 2011-05-19 NOTE — Telephone Encounter (Signed)
Closing phone nite due to no response from pt

## 2011-05-26 ENCOUNTER — Telehealth: Payer: Self-pay | Admitting: *Deleted

## 2011-05-26 MED ORDER — CLONAZEPAM 1 MG PO TABS: 1.0000 mg | ORAL_TABLET | Freq: Every day | ORAL | Status: DC | PRN

## 2011-05-26 NOTE — Telephone Encounter (Signed)
OK to fill this prescription with additional refills x0 Needs OV Thank you!  

## 2011-05-26 NOTE — Telephone Encounter (Signed)
Done. Will have schedulers contact pt to sched OV.  

## 2011-05-26 NOTE — Telephone Encounter (Signed)
Rf req for Clonazepam 1 mg. Ok to Rf?

## 2011-06-13 ENCOUNTER — Telehealth: Payer: Self-pay | Admitting: *Deleted

## 2011-06-13 NOTE — Telephone Encounter (Signed)
Per schedulers appt made for 06/21/11... 06/13/11@4 ;53pm/LMB

## 2011-06-13 NOTE — Telephone Encounter (Signed)
Message copied by Deatra James on Tue Jun 13, 2011  4:53 PM ------      Message from: Etheleen Sia      Created: Wed May 31, 2011 11:22 AM       She made an appt for June 5      ----- Message -----         From: Merrilyn Puma, CMA         Sent: 05/26/2011   1:51 PM           To: Remigio Eisenmenger, #            Pt needs OV per AVP.                  Thanks!!!

## 2011-06-14 ENCOUNTER — Telehealth: Payer: Self-pay | Admitting: *Deleted

## 2011-06-14 DIAGNOSIS — Z0000 Encounter for general adult medical examination without abnormal findings: Secondary | ICD-10-CM

## 2011-06-14 NOTE — Telephone Encounter (Signed)
Received staff msg made cpx for August. Need labs entered... 06/14/11@4 :20pm/lmb

## 2011-06-14 NOTE — Telephone Encounter (Signed)
Message copied by Deatra James on Wed Jun 14, 2011  4:19 PM ------      Message from: COUSIN, SHARON T      Created: Wed Jun 14, 2011  9:53 AM      Regarding: PHY DATE   08/18/11       THANKS

## 2011-06-21 ENCOUNTER — Encounter: Payer: Self-pay | Admitting: Internal Medicine

## 2011-06-21 ENCOUNTER — Ambulatory Visit (INDEPENDENT_AMBULATORY_CARE_PROVIDER_SITE_OTHER): Payer: 59 | Admitting: Internal Medicine

## 2011-06-21 VITALS — BP 128/80 | HR 80 | Temp 98.6°F | Resp 16 | Wt 176.0 lb

## 2011-06-21 DIAGNOSIS — E538 Deficiency of other specified B group vitamins: Secondary | ICD-10-CM

## 2011-06-21 DIAGNOSIS — K209 Esophagitis, unspecified without bleeding: Secondary | ICD-10-CM

## 2011-06-21 DIAGNOSIS — K219 Gastro-esophageal reflux disease without esophagitis: Secondary | ICD-10-CM

## 2011-06-21 DIAGNOSIS — I10 Essential (primary) hypertension: Secondary | ICD-10-CM

## 2011-06-21 DIAGNOSIS — H6120 Impacted cerumen, unspecified ear: Secondary | ICD-10-CM

## 2011-06-21 DIAGNOSIS — H6121 Impacted cerumen, right ear: Secondary | ICD-10-CM | POA: Insufficient documentation

## 2011-06-21 DIAGNOSIS — R259 Unspecified abnormal involuntary movements: Secondary | ICD-10-CM

## 2011-06-21 MED ORDER — CLONAZEPAM 1 MG PO TABS: 1.0000 mg | ORAL_TABLET | Freq: Three times a day (TID) | ORAL | Status: DC | PRN

## 2011-06-21 NOTE — Progress Notes (Signed)
Patient ID: Christine Duncan, female   DOB: 01-Dec-1980, 31 y.o.   MRN: [id number]  Subjective:    Patient ID: Christine Duncan, female    DOB: 1980/05/12, 31 y.o.   MRN: [id number]  Ear Fullness  Associated symptoms include a rash (rosacea). Pertinent negatives include no abdominal pain, coughing or headaches.    F/u HTN - good most of the time w/o Rx C/o tremor and anxiety - worse C/o rash on face  Review of Systems  Constitutional: Negative for chills, activity change, appetite change, fatigue and unexpected weight change.  HENT: Negative for congestion, mouth sores and sinus pressure.   Eyes: Negative for visual disturbance.  Respiratory: Negative for cough and chest tightness.   Gastrointestinal: Negative for nausea and abdominal pain.  Genitourinary: Negative for frequency, difficulty urinating and vaginal pain.  Musculoskeletal: Negative for back pain and gait problem.  Skin: Positive for rash (rosacea). Negative for pallor.       hives  Neurological: Positive for tremors. Negative for dizziness, weakness, numbness and headaches.  Psychiatric/Behavioral: Negative for suicidal ideas, confusion, sleep disturbance and dysphoric mood. The patient is nervous/anxious.    Wt Readings from Last 3 Encounters:  06/21/11 176 lb (79.833 kg)  08/02/10 171 lb (77.565 kg)  06/14/10 165 lb 12.8 oz (75.206 kg)   BP Readings from Last 3 Encounters:  06/21/11 128/80  01/11/11 110/80  10/27/10 110/80         Objective:   Physical Exam  Constitutional: She appears well-developed and well-nourished. No distress.  HENT:  Head: Normocephalic.  Right Ear: External ear normal.  Left Ear: External ear normal.  Nose: Nose normal.  Mouth/Throat: Oropharynx is clear and moist.  Eyes: Conjunctivae are normal. Pupils are equal, round, and reactive to light. Right eye exhibits no discharge. Left eye exhibits no discharge.  Neck: Normal range of motion. Neck supple. No JVD present. No tracheal  deviation present. No thyromegaly present.  Cardiovascular: Normal rate, regular rhythm and normal heart sounds.   Pulmonary/Chest: No stridor. No respiratory distress. She has no wheezes.  Abdominal: Soft. Bowel sounds are normal. She exhibits no distension and no mass. There is no tenderness. There is no rebound and no guarding.  Musculoskeletal: She exhibits no edema and no tenderness.  Lymphadenopathy:    She has no cervical adenopathy.  Neurological: She displays normal reflexes. No cranial nerve deficit. She exhibits normal muscle tone. Coordination normal.  Skin: No rash noted. There is erythema (rosacea).  Psychiatric: She has a normal mood and affect. Her behavior is normal. Judgment and thought content normal.   Lab Results  Component Value Date   WBC 8.1 01/21/2010   HGB 13.6 01/21/2010   HCT 40.0 01/21/2010   PLT 262 01/21/2010   GLUCOSE 93 03/24/2010   ALT 47* 10/15/2009   AST 20 10/15/2009   NA 140 03/24/2010   K 3.4* 03/24/2010   CL 99 03/24/2010   CREATININE 0.86 03/24/2010   BUN 14 03/24/2010   CO2 30 03/24/2010   TSH 0.698 02/08/2011          Assessment & Plan:

## 2011-06-21 NOTE — Assessment & Plan Note (Signed)
Chronic (father w/Barrett's and esoph ca at 68) Continue with current prescription therapy as reflected on the Med list.

## 2011-06-21 NOTE — Assessment & Plan Note (Signed)
Continue with current prescription therapy as reflected on the Med list.  

## 2011-06-21 NOTE — Assessment & Plan Note (Signed)
Will irrigate 

## 2011-06-21 NOTE — Assessment & Plan Note (Signed)
Chronic (father w/Barrett's and esoph ca at 41) Continue with current prescription therapy as reflected on the Med list. GI consult

## 2011-06-21 NOTE — Assessment & Plan Note (Signed)
No recent relapse 

## 2011-06-21 NOTE — Assessment & Plan Note (Signed)
BP Readings from Last 3 Encounters:  06/21/11 128/80  01/11/11 110/80  10/27/10 110/80

## 2011-06-26 ENCOUNTER — Other Ambulatory Visit: Payer: 59

## 2011-06-26 ENCOUNTER — Telehealth: Payer: Self-pay | Admitting: *Deleted

## 2011-06-26 ENCOUNTER — Other Ambulatory Visit: Payer: Self-pay | Admitting: *Deleted

## 2011-06-26 DIAGNOSIS — R5383 Other fatigue: Secondary | ICD-10-CM

## 2011-06-26 DIAGNOSIS — R5381 Other malaise: Secondary | ICD-10-CM

## 2011-06-26 DIAGNOSIS — E538 Deficiency of other specified B group vitamins: Secondary | ICD-10-CM

## 2011-06-26 LAB — VITAMIN B12: Vitamin B-12: 382 pg/mL (ref 211–911)

## 2011-06-26 LAB — TSH: TSH: 0.536 u[IU]/mL (ref 0.350–4.500)

## 2011-06-26 NOTE — Telephone Encounter (Signed)
Rf req for Clonazepam ODT1 mg. Did pt receive written rx at 06-21-11 OV?

## 2011-06-26 NOTE — Telephone Encounter (Signed)
OK to give another copy (if she did not get it at the OV) Thx

## 2011-06-27 ENCOUNTER — Encounter: Payer: Self-pay | Admitting: Internal Medicine

## 2011-06-27 LAB — IBC PANEL: TIBC: 393 ug/dL (ref 250–470)

## 2011-06-28 NOTE — Telephone Encounter (Signed)
Left mess for patient to call back.  

## 2011-06-28 NOTE — Telephone Encounter (Signed)
Pt states she will take written Rx to pharmacy. I advised her we will disregard request.

## 2011-07-03 ENCOUNTER — Ambulatory Visit (INDEPENDENT_AMBULATORY_CARE_PROVIDER_SITE_OTHER): Payer: 59 | Admitting: Internal Medicine

## 2011-07-03 ENCOUNTER — Encounter: Payer: Self-pay | Admitting: Internal Medicine

## 2011-07-03 ENCOUNTER — Telehealth: Payer: Self-pay | Admitting: Internal Medicine

## 2011-07-03 VITALS — BP 130/74 | HR 95 | Temp 98.8°F | Ht 66.0 in

## 2011-07-03 DIAGNOSIS — L501 Idiopathic urticaria: Secondary | ICD-10-CM

## 2011-07-03 MED ORDER — PREDNISONE (PAK) 10 MG PO TABS: 10.0000 mg | ORAL_TABLET | ORAL | Status: AC

## 2011-07-03 MED ORDER — HYDROXYZINE HCL 10 MG PO TABS: 10.0000 mg | ORAL_TABLET | Freq: Three times a day (TID) | ORAL | Status: DC | PRN

## 2011-07-03 MED ORDER — METHYLPREDNISOLONE ACETATE 80 MG/ML IJ SUSP
120.0000 mg | Freq: Once | INTRAMUSCULAR | Status: AC
  Administered 2011-07-03: 120 mg via INTRAMUSCULAR

## 2011-07-03 NOTE — Telephone Encounter (Signed)
Caller: Christine Duncan/Patient; Phone Number: [phone]; Message from caller: Needing to know if she can get a Steriod Shot for her Hives.  Please call back at [phone] or [phone]

## 2011-07-03 NOTE — Progress Notes (Signed)
  Subjective:    Patient ID: Christine Duncan, female    DOB: April 10, 1980, 31 y.o.   MRN: [id number]  HPI  complains of recurrent hives Hx same - steroid course required 2-3x/years Prior allergy eval unremarkable - felt related to stress  Past Medical History  Diagnosis Date  . Blood type, Rh negative   . SAB (spontaneous abortion)     3 times  . IBS (irritable bowel syndrome)   . Acid reflux   . Bipolar 1 disorder   . PIH (pregnancy induced hypertension), antepartum     icu after delivery and currently hypertensive  . B12 deficiency   . 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  Respiratory: Negative for shortness of breath and wheezing.   Cardiovascular: Negative for chest pain and palpitations.  Skin: Positive for rash.       Objective:   Physical Exam BP 130/74  Pulse 95  Temp 98.8 F (37.1 C) (Oral)  Ht 5\' 6"  (1.676 m)  SpO2 97% Wt Readings from Last 3 Encounters:  06/21/11 176 lb (79.833 kg)  08/02/10 171 lb (77.565 kg)  06/14/10 165 lb 12.8 oz (75.206 kg)   Constitutional: She appears well-developed and well-nourished. No distress.  Cardiovascular: Normal rate, regular rhythm and normal heart sounds.  No murmur heard. No BLE edema. Pulmonary/Chest: Effort normal and breath sounds normal. No respiratory distress. She has no wheezes.  Neurological: She is alert and oriented to person, place, and time. No cranial nerve deficit. Coordination normal.  Skin: Urticaria - B distal LE and soles of feet - also palms - Skin is warm and dry. No rash noted. No erythema.  Psychiatric: She has a normal mood and affect. Her behavior is normal. Judgment and thought content normal.       Assessment & Plan:  Urticaria, idiopathic  - chronic and recurrent history of same, stress induced IM medrol today, pred pak to use if needed Continue antihistamine prn - refill atarax

## 2011-07-03 NOTE — Patient Instructions (Signed)
It was good to see you today. Medrol (steroid) shot given today - pred pak x 6 days and atarax as needed - Your prescription(s) have been submitted to your pharmacy. Please take as directed and contact our office if you believe you are having problem(s) with the medication(s).

## 2011-07-03 NOTE — Telephone Encounter (Signed)
OK w/me to have a steroid shot w/Stacey -- Depomedrol 120 mg IM To ER or OV if sick Thx

## 2011-07-03 NOTE — Telephone Encounter (Signed)
THE PATIENT REFUSED 911; EMERGENT CALL: Caller: Christine Duncan/Patient; PCP: Plotnikov, Alex; CB#: [phone]; Call regarding Chronic Hives; sx started 07/01/11; hives are located on feet and ankles, hand and wrists and face area; denies difficulty breathing; hives are very itchy and painful; difficult to walk; these look like they always look when she gets these; she states that Benadryl doesn't touch them and she always needs a steroid; she is wanting steroids called in for treatment; Triaged per Hives Guideline; See in 72 hr d/t repeat episodes of hives within 6 months; [facility] [phone]; OFFICE PLEASE REVIEW- PT WOULD LIKE TO HAVE STEROID CALLED IN FOR HIVES THAT MD IS AWARE OF THAT SHE GETS CHRONICALLY; pt states that MD will generally call in steroids for her since MD is aware of situation; OFFICE PLEASE CALL PT BACK WITH FURTHER INSTRUCTIONS;

## 2011-07-10 ENCOUNTER — Telehealth: Payer: Self-pay | Admitting: Internal Medicine

## 2011-07-10 NOTE — Telephone Encounter (Signed)
Pt is requesting "Oral Dissolvable Tablet", please advise on change.

## 2011-07-10 NOTE — Telephone Encounter (Signed)
Pt is calling and  requesting to speak to RN from Dr Posey Rea regarding her Klonopin 1 mg that she needs a  refill for.  She called pharmacy and requested refill and pharmacy stated that office would not fill this Rx d/t MD is on vacation for the week and pt is requesting the ODT. Pt states that she needs a refill this week.  She needs the Klonopin 1mg  ODT  called in. Pt states that she has always taken the ODT for the past 12 years and is noted in chart per pt.  OFFICE PLEASE REVIEW AND CALL PT BACK AT [phone]; pt was last seen in the office 2 weeks ago; denies any new sx just needing a refill

## 2011-07-10 NOTE — Telephone Encounter (Signed)
Ok Thx 

## 2011-07-11 MED ORDER — CLONAZEPAM 1 MG PO TBDP: 1.0000 mg | ORAL_TABLET | Freq: Three times a day (TID) | ORAL | Status: DC | PRN

## 2011-07-11 NOTE — Telephone Encounter (Signed)
Rx changed from tablet to oral disinter grating tablet with Okey Regal at [facility]. Pt advised of same.

## 2011-07-11 NOTE — Telephone Encounter (Signed)
What am I supposed to call in?

## 2011-07-20 ENCOUNTER — Other Ambulatory Visit: Payer: Self-pay | Admitting: Gynecology

## 2011-08-01 ENCOUNTER — Ambulatory Visit (INDEPENDENT_AMBULATORY_CARE_PROVIDER_SITE_OTHER): Payer: 59 | Admitting: Internal Medicine

## 2011-08-01 ENCOUNTER — Encounter: Payer: Self-pay | Admitting: Internal Medicine

## 2011-08-01 ENCOUNTER — Ambulatory Visit (INDEPENDENT_AMBULATORY_CARE_PROVIDER_SITE_OTHER): Payer: 59 | Admitting: Gynecology

## 2011-08-01 ENCOUNTER — Other Ambulatory Visit: Payer: Self-pay | Admitting: Gynecology

## 2011-08-01 ENCOUNTER — Encounter: Payer: Self-pay | Admitting: Gynecology

## 2011-08-01 VITALS — BP 116/74 | HR 90 | Ht 65.0 in | Wt 173.0 lb

## 2011-08-01 VITALS — BP 124/80 | Ht 67.5 in | Wt 173.0 lb

## 2011-08-01 DIAGNOSIS — K219 Gastro-esophageal reflux disease without esophagitis: Secondary | ICD-10-CM

## 2011-08-01 DIAGNOSIS — G250 Essential tremor: Secondary | ICD-10-CM | POA: Insufficient documentation

## 2011-08-01 DIAGNOSIS — Z85038 Personal history of other malignant neoplasm of large intestine: Secondary | ICD-10-CM

## 2011-08-01 DIAGNOSIS — N871 Moderate cervical dysplasia: Secondary | ICD-10-CM

## 2011-08-01 DIAGNOSIS — L509 Urticaria, unspecified: Secondary | ICD-10-CM | POA: Insufficient documentation

## 2011-08-01 DIAGNOSIS — K589 Irritable bowel syndrome without diarrhea: Secondary | ICD-10-CM | POA: Insufficient documentation

## 2011-08-01 DIAGNOSIS — R143 Flatulence: Secondary | ICD-10-CM

## 2011-08-01 DIAGNOSIS — R141 Gas pain: Secondary | ICD-10-CM

## 2011-08-01 DIAGNOSIS — Z01419 Encounter for gynecological examination (general) (routine) without abnormal findings: Secondary | ICD-10-CM

## 2011-08-01 DIAGNOSIS — R1310 Dysphagia, unspecified: Secondary | ICD-10-CM

## 2011-08-01 DIAGNOSIS — R197 Diarrhea, unspecified: Secondary | ICD-10-CM

## 2011-08-01 MED ORDER — MOVIPREP 100 G PO SOLR: 1.0000 | Freq: Once | ORAL | Status: DC

## 2011-08-01 MED ORDER — NORETHIN ACE-ETH ESTRAD-FE 1-20 MG-MCG PO TABS: 1.0000 | ORAL_TABLET | Freq: Every day | ORAL | Status: DC

## 2011-08-01 NOTE — Addendum Note (Signed)
Addended by: Dayna Barker on: 08/01/2011 09:48 AM   Modules accepted: Orders

## 2011-08-01 NOTE — Patient Instructions (Addendum)
You have been scheduled for an endoscopy and colonoscopy with propofol. Please follow the written instructions given to you at your visit today. Please pick up your prep at the pharmacy within the next 1-3 days. If you use inhalers (even only as needed), please bring them with you on the day of your procedure.  We have given you samples of Align. This puts good bacteria back into your colon. You should take 1 capsule by mouth once daily. If this works well for you, it can be purchased over the counter.  

## 2011-08-01 NOTE — Progress Notes (Signed)
Christine Duncan May 23, 1980 [id number]        31 y.o.  [id number] for annual exam.  Several issues noted below.  Past medical history,surgical history, medications, allergies, family history and social history were all reviewed and documented in the EPIC chart. ROS:  Was performed and pertinent positives and negatives are included in the history.  Exam: Christine Duncan assistant Filed Vitals:   08/01/11 0901  BP: 124/80  Height: 5' 7.5" (1.715 m)  Weight: 173 lb (78.472 kg)   General appearance  Normal Skin grossly normal Head/Neck normal with no cervical or supraclavicular adenopathy thyroid normal Lungs  clear Cardiac RR, without RMG Abdominal  soft, nontender, without masses, organomegaly or hernia Breasts  examined lying and sitting without masses, retractions, discharge or axillary adenopathy. Pelvic  Ext/BUS/vagina  normal   Cervix  normal Pap/HPV  Uterus  Antevert it, normal size, shape and contour, midline and mobile nontender   Adnexa  Without masses or tenderness    Anus and perineum  normal   Rectovaginal  normal sphincter tone without palpated masses or tenderness.    Assessment/Plan:  31 y.o. [id number] female for annual exam.   1. History high-grade dysplasia. Status post LEEP 07 2012. Pap 6 months ago showed ASCUS negative high risk HPV. Pap/HPV done today. If negative plan annual follow up otherwise will triage based on results. 2. Contraception. Patients on oral contraception doing well wants to continue. Had smoked in the past but stopped several years ago. I reviewed the risks to include stroke heart attack DVT. Patient understands accepts and I refilled her Loestrin 120 equivalence times a year. 3. Breast health. SBE monthly reviewed. 4. Health maintenance. Patient sees Dr. Posey Duncan and plans to see him in several months. No blood work was done today as all be done through his office. She notes her cholesterol was elevated at a check at work and I have strongly encouraged her to  follow up with Dr. Posey Duncan to have this evaluated and possibly treated as needed.    Christine Lords MD, 9:25 AM 08/01/2011

## 2011-08-01 NOTE — Patient Instructions (Addendum)
Office will call you with Pap smear results. Follow up in one year if Pap is normal for annual gynecologic follow up. Follow up with Dr. Posey Rea in reference to your cholesterol.

## 2011-08-02 ENCOUNTER — Encounter: Payer: Self-pay | Admitting: Internal Medicine

## 2011-08-02 LAB — URINALYSIS W MICROSCOPIC + REFLEX CULTURE
Casts: NONE SEEN
Crystals: NONE SEEN
Glucose, UA: NEGATIVE mg/dL
Specific Gravity, Urine: 1.026 (ref 1.005–1.030)
Squamous Epithelial / LPF: NONE SEEN
Urobilinogen, UA: 0.2 mg/dL (ref 0.0–1.0)
pH: 6 (ref 5.0–8.0)

## 2011-08-02 NOTE — Progress Notes (Signed)
HISTORY OF PRESENT ILLNESS:  Christine Duncan is a 31 y.o. female with bipolar disorder, GERD, and IBS. She presents today regarding a number of GI issues, and upon referral from Dr. Posey Rea. Patient reports abdominal complaints since her teenage years. Upper GI complaints are essentially reflux. Lower GI complaints have included intermittent problems with lower abdominal discomfort, urgency, and loose stools. The patient is new to me. She was evaluated by Dr. Corinda Gubler 2004 for similar complaints. At that time, complete colonoscopy and upper endoscopy were performed. Biopsies of different parts of the colon were said to show both focal active colitis and chronic inflammation. Duodenal biopsies were unremarkable. Treatments included Levsin, Robinul, and Flagyl. None were helpful. In terms of GERD, she takes Prilosec 20 mg daily. On the medication no reflux symptoms. She does have intermittent solid food dysphagia item such as bread and meat. She has had this for years. Of concern, her father was diagnosed with Barrett's esophagus and esophageal cancer at age 35. She states that her IBS complaints are worse. Problems have been daily for years but worse recently with 4-6 loose bowel movements per day, as opposed to 2 previously. Symptoms seem to be exacerbated by food and stress. Other complaints include bloating, belching, and increased gas. This seems worse over the past year. She describes significant borborygmi. Imodium make sure stools a bit more formed. Over-the-counter anti-gas treatments have not helped. Her weight has been stable.  REVIEW OF SYSTEMS:  All non-GI ROS negative except for allergies, anxiety, fatigue, back pain, itching  Past Medical History  Diagnosis Date  . Blood type, Rh negative   . IBS (irritable bowel syndrome)   . Acid reflux   . Bipolar 1 disorder   . PIH (pregnancy induced hypertension), antepartum     icu after delivery and currently hypertensive  . B12 deficiency   .  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 Spastic colon   . Hives   . Essential tremor     Past Surgical History  Procedure Date  . Dilation and curettage of uterus 2005  . Cesarean section 2006  . Cesarean section 2011  . Leep 07/2010    CIN II, Margins free    Social History Christine Duncan  reports that she has quit smoking. She has never used smokeless tobacco. She reports that she drinks alcohol. She reports that she does not use illicit drugs.  family history includes Breast cancer in her paternal grandmother; Cancer in her paternal grandmother; Cancer (age of onset:54) in her father; Diabetes in her paternal grandmother; Hyperlipidemia in her paternal grandmother; Seizures in her maternal grandmother and other; and Thyroid disease in her maternal grandmother and mother.  Allergies  Allergen Reactions  . Carbidopa-Levodopa     REACTION: nausea  . Sertraline Hcl     REACTION: Mania       PHYSICAL EXAMINATION: Vital signs: BP 116/74  Pulse 90  Ht 5\' 5"  (1.651 m)  Wt 173 lb (78.472 kg)  BMI 28.79 kg/m2  SpO2 98%  Constitutional: generally well-appearing, no acute distress Psychiatric: alert and oriented x3, cooperative Eyes: extraocular movements intact, anicteric, conjunctiva pink Mouth: oral pharynx moist, no lesions Neck: supple no lymphadenopathy Cardiovascular: heart regular rate and rhythm, no murmur Lungs: clear to auscultation bilaterally Abdomen: soft,obese, nontender, nondistended, no obvious ascites, no peritoneal signs, normal bowel sounds, no organomegaly Rectal:deferred until colonoscopy Extremities: no lower extremity edema bilaterally Skin: no lesions on visible extremities Neuro: No focal deficits. No asterixis.  ASSESSMENT:  #1. GERD, long-standing. Classic symptoms controlled with PPI #2. Intermittent solid food dysphagia. Rule out peptic stricture #3. Father with Barrett's esophagus and esophageal cancer at  72 #4. Chronic lower GI complaints include lower bowel discomfort, urgency, and loose stools. Probably irritable bowel syndrome. However, review of biopsies from colonoscopy in 2004 raised the question of colitis. Microscopic or otherwise. This needs to be reevaluated. #5. Increased intestinal gas #6. Grandmother with colon cancer  PLAN:  #1. Upper endoscopy with possible esophageal dilation.The nature of the procedure, as well as the risks, benefits, and alternatives were carefully and thoroughly reviewed with the patient. Ample time for discussion and questions allowed. The patient understood, was satisfied, and agreed to proceed.  #2. Reflux precautions #3. Continue PPI #4. Trial of probiotic Align one daily. 4 weeks of samples provided #5. Discussion on intestinal gas. Literature on intestinal gas provided as well as anti-gas and flatulence dietary sheet #6. Colonoscopy with biopsies.The nature of the procedure, as well as the risks, benefits, and alternatives were carefully and thoroughly reviewed with the patient. Ample time for discussion and questions allowed. The patient understood, was satisfied, and agreed to proceed.  #7. Movi prep prescribed. Patient instructed on its use #8. If colitis ruled out, consider treatment with Librax and antimotility agents. Alternatively, could consider enrollment in IBS/diarrhea drug study

## 2011-08-07 LAB — PAP IG AND HPV HIGH-RISK

## 2011-08-09 ENCOUNTER — Telehealth: Payer: Self-pay | Admitting: Gynecology

## 2011-08-09 ENCOUNTER — Encounter: Payer: Self-pay | Admitting: Gynecology

## 2011-08-09 NOTE — Telephone Encounter (Signed)
Tell patient that her Pap smear showed ASCUS (low grade atypia) with negative HPV. I recommend we repeat Pap smear with HPV screening in 1 year

## 2011-08-10 NOTE — Telephone Encounter (Signed)
Pt informed with the below note. 

## 2011-08-18 ENCOUNTER — Encounter: Payer: 59 | Admitting: Internal Medicine

## 2011-09-01 ENCOUNTER — Other Ambulatory Visit: Payer: 59

## 2011-09-01 ENCOUNTER — Ambulatory Visit (INDEPENDENT_AMBULATORY_CARE_PROVIDER_SITE_OTHER): Payer: 59 | Admitting: Internal Medicine

## 2011-09-01 ENCOUNTER — Encounter: Payer: Self-pay | Admitting: Internal Medicine

## 2011-09-01 VITALS — BP 122/78 | HR 74 | Temp 98.8°F | Ht 66.0 in | Wt 176.0 lb

## 2011-09-01 DIAGNOSIS — R5383 Other fatigue: Secondary | ICD-10-CM

## 2011-09-01 DIAGNOSIS — M791 Myalgia, unspecified site: Secondary | ICD-10-CM

## 2011-09-01 DIAGNOSIS — G250 Essential tremor: Secondary | ICD-10-CM

## 2011-09-01 DIAGNOSIS — IMO0001 Reserved for inherently not codable concepts without codable children: Secondary | ICD-10-CM

## 2011-09-01 DIAGNOSIS — R5381 Other malaise: Secondary | ICD-10-CM

## 2011-09-01 LAB — CBC WITH DIFFERENTIAL/PLATELET
Basophils Relative: 1 % (ref 0–1)
Eosinophils Absolute: 0.1 10*3/uL (ref 0.0–0.7)
Lymphs Abs: 3.1 10*3/uL (ref 0.7–4.0)
MCH: 29.5 pg (ref 26.0–34.0)
MCHC: 35.1 g/dL (ref 30.0–36.0)
Neutro Abs: 2.9 10*3/uL (ref 1.7–7.7)
Neutrophils Relative %: 44 % (ref 43–77)
Platelets: 276 10*3/uL (ref 150–400)
RBC: 4.74 MIL/uL (ref 3.87–5.11)

## 2011-09-01 MED ORDER — DULOXETINE HCL 30 MG PO CPEP: 30.0000 mg | ORAL_CAPSULE | Freq: Every day | ORAL | Status: DC

## 2011-09-01 NOTE — Assessment & Plan Note (Signed)
Strong FH - s/o neuro eval for same Uses klonopin for same

## 2011-09-01 NOTE — Progress Notes (Signed)
  Subjective:    Patient ID: Christine Duncan, female    DOB: [birth year], 31 y.o.   MRN: [id number]  HPI  complains of overwhelming exhaustion and hypersomnia Fatigue despite sleep - never rested +snoring most nights Myalgias and stiffness without swelling in joints - affects back and neck Takes klonopin 1-3x/day for tremor symptoms - no other med changes Denies overlap with chronic biploar symptoms   Past Medical History  Diagnosis Date  . Blood type, Rh negative   . IBS (irritable bowel syndrome)   . Acid reflux   . Bipolar 1 disorder   . PIH (pregnancy induced hypertension), antepartum     icu after delivery and currently hypertensive  . B12 deficiency   . Spastic colon   . Hives   . Essential tremor     Review of Systems  Constitutional: Positive for fatigue. Negative for fever and unexpected weight change.  Respiratory: Negative for cough and shortness of breath.   Cardiovascular: Negative for chest pain and palpitations.  Musculoskeletal: Positive for myalgias. Negative for joint swelling and arthralgias.  Neurological: Positive for headaches (chronic). Negative for weakness and numbness.       Objective:   Physical Exam BP 122/78  Pulse 74  Temp 98.8 F (37.1 C) (Oral)  Ht 5\' 6"  (1.676 m)  Wt 176 lb (79.833 kg)  BMI 28.41 kg/m2  SpO2 98% Wt Readings from Last 3 Encounters:  09/01/11 176 lb (79.833 kg)  08/01/11 173 lb (78.472 kg)  08/01/11 173 lb (78.472 kg)   Constitutional: She appears well-developed and well-nourished. No distress.  mom at side Neck: Normal range of motion. Neck supple. No JVD present. No thyromegaly present.  Cardiovascular: Normal rate, regular rhythm and normal heart sounds.  No murmur heard. No BLE edema. Pulmonary/Chest: Effort normal and breath sounds normal. No respiratory distress. She has no wheezes.  Musculoskeletal: tender points posterior neck, B shoulder blades, anterior clavicle. Normal range of motion, no joint effusions.  No gross deformities Neurological: She is alert and oriented to person, place, and time. No cranial nerve deficit. Coordination normal.  Skin: Skin is warm and dry. No rash noted. No erythema.  Psychiatric: She has a dysphoric mood and affect. Her behavior is normal. Judgment and thought content normal.   Lab Results  Component Value Date   WBC 8.1 01/21/2010   HGB 13.6 01/21/2010   HCT 40.0 01/21/2010   PLT 262 01/21/2010   GLUCOSE 93 03/24/2010   ALT 47* 10/15/2009   AST 20 10/15/2009   NA 140 03/24/2010   K 3.4* 03/24/2010   CL 99 03/24/2010   CREATININE 0.86 03/24/2010   BUN 14 03/24/2010   CO2 30 03/24/2010   TSH 0.536 06/26/2011       Assessment & Plan:   Fatigue/myalgia - ?FM Check screening labs for autoimmune and start Cymbalta if normal labs Also consider sleep study for hypersomnia

## 2011-09-01 NOTE — Patient Instructions (Signed)
It was good to see you today. Test(s) ordered today. Your results will be called to you after review (48-72hours after test completion). If any changes need to be made, you will be notified at that time. Start cymbalta for fibromyalgia -Your prescription(s) have been submitted to your pharmacy. Please take as directed and contact our office if you believe you are having problem(s) with the medication(s). follow up with Dr Posey Rea as scheduled to review symptoms and medications  Fibromyalgia Fibromyalgia is a disorder that is often misunderstood. It is associated with muscular pains and tenderness that comes and goes. It is often associated with fatigue and sleep disturbances. Though it tends to be long-lasting, fibromyalgia is not life-threatening. CAUSES   The exact cause of fibromyalgia is unknown. People with certain gene types are predisposed to developing fibromyalgia and other conditions. Certain factors can play a role as triggers, such as:  Spine disorders.   Arthritis.   Severe injury (trauma) and other physical stressors.   Emotional stressors.  SYMPTOMS    The main symptom is pain and stiffness in the muscles and joints, which can vary over time.   Sleep and fatigue problems.  Other related symptoms may include:  Bowel and bladder problems.   Headaches.   Visual problems.   Problems with odors and noises.   Depression or mood changes.   Painful periods (dysmenorrhea).   Dryness of the skin or eyes.  DIAGNOSIS   There are no specific tests for diagnosing fibromyalgia. Patients can be diagnosed accurately from the specific symptoms they have. The diagnosis is made by determining that nothing else is causing the problems. TREATMENT   There is no cure. Management includes medicines and an active, healthy lifestyle. The goal is to enhance physical fitness, decrease pain, and improve sleep. HOME CARE INSTRUCTIONS    Only take over-the-counter or prescription medicines  as directed by your caregiver. Sleeping pills, tranquilizers, and pain medicines may make your problems worse.   Low-impact aerobic exercise is very important and advised for treatment. At first, it may seem to make pain worse. Gradually increasing your tolerance will overcome this feeling.   Learning relaxation techniques and how to control stress will help you. Biofeedback, visual imagery, hypnosis, muscle relaxation, yoga, and meditation are all options.   Anti-inflammatory medicines and physical therapy may provide short-term help.   Acupuncture or massage treatments may help.   Take muscle relaxant medicines as suggested by your caregiver.   Avoid stressful situations.   Plan a healthy lifestyle. This includes your diet, sleep, rest, exercise, and friends.   Find and practice a hobby you enjoy.   Join a fibromyalgia support group for interaction, ideas, and sharing advice. This may be helpful.  SEEK MEDICAL CARE IF:   You are not having good results or improvement from your treatment. FOR MORE INFORMATION   National Fibromyalgia Association: www.fmaware.org Arthritis Foundation: www.arthritis.org Document Released: 01/02/2005 Document Revised: 12/22/2010 Document Reviewed: 04/14/2009 [facility] Patient Information 2012 [place], [place].

## 2011-09-02 LAB — SEDIMENTATION RATE: Sed Rate: 1 mm/hr (ref 0–22)

## 2011-09-02 LAB — RHEUMATOID FACTOR: Rhuematoid fact SerPl-aCnc: <10 IU/mL (ref <=14)

## 2011-09-02 LAB — TSH: TSH: 0.513 u[IU]/mL (ref 0.350–4.500)

## 2011-09-04 LAB — ANA: Anti Nuclear Antibody(ANA): NEGATIVE

## 2011-09-06 ENCOUNTER — Ambulatory Visit (AMBULATORY_SURGERY_CENTER): Payer: 59 | Admitting: Internal Medicine

## 2011-09-06 ENCOUNTER — Encounter: Payer: Self-pay | Admitting: Internal Medicine

## 2011-09-06 VITALS — BP 127/77 | HR 81 | Temp 97.8°F | Resp 16 | Ht 66.0 in | Wt 173.0 lb

## 2011-09-06 DIAGNOSIS — K219 Gastro-esophageal reflux disease without esophagitis: Secondary | ICD-10-CM

## 2011-09-06 DIAGNOSIS — D126 Benign neoplasm of colon, unspecified: Secondary | ICD-10-CM

## 2011-09-06 DIAGNOSIS — K599 Functional intestinal disorder, unspecified: Secondary | ICD-10-CM

## 2011-09-06 DIAGNOSIS — Z800 Family history of malignant neoplasm of digestive organs: Secondary | ICD-10-CM

## 2011-09-06 DIAGNOSIS — R141 Gas pain: Secondary | ICD-10-CM

## 2011-09-06 DIAGNOSIS — R1310 Dysphagia, unspecified: Secondary | ICD-10-CM

## 2011-09-06 DIAGNOSIS — R197 Diarrhea, unspecified: Secondary | ICD-10-CM

## 2011-09-06 DIAGNOSIS — K589 Irritable bowel syndrome without diarrhea: Secondary | ICD-10-CM

## 2011-09-06 DIAGNOSIS — R109 Unspecified abdominal pain: Secondary | ICD-10-CM

## 2011-09-06 MED ORDER — CILIDINIUM-CHLORDIAZEPOXIDE 2.5-5 MG PO CAPS: 1.0000 | ORAL_CAPSULE | Freq: Three times a day (TID) | ORAL | Status: DC | PRN

## 2011-09-06 MED ORDER — SODIUM CHLORIDE 0.9 % IV SOLN: 500.0000 mL | INTRAVENOUS | Status: DC

## 2011-09-06 NOTE — Progress Notes (Signed)
Pt continued to question why her pathology could not be sent to [facility].  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  Contacted our director Clarnce Flock to find out for patient if this is possible.  She remains in recovery in the consultation room awaiting an answer.

## 2011-09-06 NOTE — Progress Notes (Signed)
Patient did not have preoperative order for IV antibiotic SSI prophylaxis. (G8918)  Patient did not experience any of the following events: a burn prior to discharge; a fall within the facility; wrong site/side/patient/procedure/implant event; or a hospital transfer or hospital admission upon discharge from the facility. (G8907)  

## 2011-09-06 NOTE — Op Note (Signed)
[facility] [address] [place], [address]   COLONOSCOPY PROCEDURE REPORT  PATIENT: Christine Duncan, Christine Duncan  MR#: [id number] BIRTHDATE: 09-24-1980 , 31  yrs. old GENDER: Female ENDOSCOPIST: Roxy Cedar, MD REFERRED XB:JYNW Buckner Malta, M.D. PROCEDURE DATE:  09/06/2011 PROCEDURE:   Colonoscopy with biopsy Colonoscopy with snare polypectomy    x 1 ASA CLASS:   Class II INDICATIONS:abdominal pain and chronic diarrhea. Remote colon w/ bx ? colitis MEDICATIONS: MAC sedation, administered by CRNA and propofol (Diprivan) 400mg  IV  DESCRIPTION OF PROCEDURE:   After the risks benefits and alternatives of the procedure were thoroughly explained, informed consent was obtained.  A digital rectal exam revealed no abnormalities of the rectum.   The LB CF-H180AL [id number]  endoscope was introduced through the anus and advanced to the cecum, which was identified by both the appendix and ileocecal valve. No adverse events experienced.   The quality of the prep was excellent, using MoviPrep  The instrument was then slowly withdrawn as the colon was fully examined.      COLON FINDINGS: The mucosa appeared normal in the terminal ileum. A diminutive polyp was found in the transverse colon.  A polypectomy was performed with a cold snare.  The resection was complete and the polyp tissue was completely retrieved.   The colon mucosa was otherwise normal. Random colon bx taken.  Retroflexed views revealed no abnormalities. The time to cecum=2.49 minutes Withdrawal time=11.33 minutes.  The scope was withdrawn and the procedure completed.  COMPLICATIONS: There were no complications.  ENDOSCOPIC IMPRESSION: 1.   The mucosa appeared normal in the terminal ileum 2.   Diminutive polyp was found in the transverse colon; polypectomy was performed with a cold snare 3.   The colon mucosa was otherwise normal s/p random bx  RECOMMENDATIONS: 1.  Await biopsy results 2.  Repeat  colonoscopy in 5 years if polyp adenomatous; otherwise 10 years 3. Prescribe Librax one po ac prn; #100; 6 refills 4. EGD today 5. Office visit with Dr Marina Goodell in 4-6 weeks  eSigned:  Roxy Cedar, MD 09/06/2011 10:20 AM   cc: Linda Hedges.  Plotnikov, MD The Patient   PATIENT NAME:  Christine Duncan, Christine Duncan MR#: [id number]

## 2011-09-06 NOTE — Patient Instructions (Addendum)
1 polyp removed and sent to pathology. Otherwise normal colon.   Office visit with Dr. Marina Goodell in 4-6 weeks. Librax prescribed. RX sent to [facility].  Take 1 by mouth as needed before meals.  Normal EGD.  Await biopsy results.  YOU HAD AN ENDOSCOPIC PROCEDURE TODAY AT THE [facility]: Refer to the procedure report that was given to you for any specific questions about what was found during the examination.  If the procedure report does not answer your questions, please call your gastroenterologist to clarify.  If you requested that your care partner not be given the details of your procedure findings, then the procedure report has been included in a sealed envelope for you to review at your convenience later.  YOU SHOULD EXPECT: Some feelings of bloating in the abdomen. Passage of more gas than usual.  Walking can help get rid of the air that was put into your GI tract during the procedure and reduce the bloating. If you had a lower endoscopy (such as a colonoscopy or flexible sigmoidoscopy) you may notice spotting of blood in your stool or on the toilet paper. If you underwent a bowel prep for your procedure, then you may not have a normal bowel movement for a few days.  DIET: Your first meal following the procedure should be a light meal and then it is ok to progress to your normal diet.  A half-sandwich or bowl of soup is an example of a good first meal.  Heavy or fried foods are harder to digest and may make you feel nauseous or bloated.  Likewise meals heavy in dairy and vegetables can cause extra gas to form and this can also increase the bloating.  Drink plenty of fluids but you should avoid alcoholic beverages for 24 hours.  ACTIVITY: Your care partner should take you home directly after the procedure.  You should plan to take it easy, moving slowly for the rest of the day.  You can resume normal activity the day after the procedure however you should NOT DRIVE or use heavy  machinery for 24 hours (because of the sedation medicines used during the test).    SYMPTOMS TO REPORT IMMEDIATELY: A gastroenterologist can be reached at any hour.  During normal business hours, 8:30 AM to 5:00 PM Monday through Friday, call [phone].  After hours and on weekends, please call the GI answering service at [phone] who will take a message and have the physician on call contact you.   Following lower endoscopy (colonoscopy or flexible sigmoidoscopy):  Excessive amounts of blood in the stool  Significant tenderness or worsening of abdominal pains  Swelling of the abdomen that is new, acute  Fever of 100F or higher  Following upper endoscopy (EGD)  Vomiting of blood or coffee ground material  New chest pain or pain under the shoulder blades  Painful or persistently difficult swallowing  New shortness of breath  Fever of 100F or higher  Black, tarry-looking stools  FOLLOW UP: If any biopsies were taken you will be contacted by phone or by letter within the next 1-3 weeks.  Call your gastroenterologist if you have not heard about the biopsies in 3 weeks.  Our staff will call the home number listed on your records the next business day following your procedure to check on you and address any questions or concerns that you may have at that time regarding the information given to you following your procedure. This is a courtesy call  and so if there is no answer at the home number and we have not heard from you through the emergency physician on call, we will assume that you have returned to your regular daily activities without incident.  SIGNATURES/CONFIDENTIALITY: You and/or your care partner have signed paperwork which will be entered into your electronic medical record.  These signatures attest to the fact that that the information above on your After Visit Summary has been reviewed and is understood.  Full responsibility of the confidentiality of this discharge  information lies with you and/or your care-partner.

## 2011-09-06 NOTE — Op Note (Signed)
[facility] [address] [place], [address]   ENDOSCOPY PROCEDURE REPORT  PATIENT: Christine Duncan, Christine Duncan  MR#: [id number] BIRTHDATE: 08/19/1980 , 31  yrs. old GENDER: Female ENDOSCOPIST: Roxy Cedar, MD REFERRED BY:  Janeal Holmes, M.D. PROCEDURE DATE:  09/06/2011 PROCEDURE:  EGD w/ biopsy ASA CLASS:     Class II INDICATIONS:  history of GERD.   dysphagia.   chronic diarrhea. MEDICATIONS: MAC sedation, administered by CRNA and propofol (Diprivan) 400mg  IV TOPICAL ANESTHETIC: none  DESCRIPTION OF PROCEDURE: After the risks benefits and alternatives of the procedure were thoroughly explained, informed consent was obtained.  The LB GIF-H180 [id number] endoscope was introduced through the mouth and advanced to the second portion of the duodenum. Without limitations.  The instrument was slowly withdrawn as the mucosa was fully examined.    The upper, middle and distal third of the esophagus were carefully inspected and no abnormalities were noted.  The z-line was well seen at the GEJ.  The endoscope was pushed into the fundus which was normal including a retroflexed view.  The antrum, gastric body, first and second part of the duodenum were unremarkable.Duodenal bx taken.   Retroflexed views revealed no abnormalities.     The scope was then withdrawn from the patient and the procedure completed.  COMPLICATIONS: There were no complications.  ENDOSCOPIC IMPRESSION: Normal EGD  RECOMMENDATIONS: 1.  Await biopsy results 2.  OP follow-up in 4-6 weeks.    eSigned:  Roxy Cedar, MD 09/06/2011 10:35 AM   ZH:YQMV V.  Plotnikov, MD;   The Patient

## 2011-09-06 NOTE — Progress Notes (Addendum)
Propofol per [facility] crna, see scanned intra procedure report. All meds titrated per crna during procedure. ewm  Per Dr Marina Goodell, we are unable to do path through [facility] due to no contract with them. ewm

## 2011-09-06 NOTE — Progress Notes (Signed)
The pt asked if any pathology or blood work is done to use the [employer].  The pt works there and it will be free for her.  I advised the pt to asked the room nurse about this.  Pt said she would. Maw

## 2011-09-07 ENCOUNTER — Telehealth: Payer: Self-pay | Admitting: *Deleted

## 2011-09-07 NOTE — Telephone Encounter (Signed)
  Follow up Call-  Call back number 09/06/2011  Post procedure Call Back phone  # [phone] cell  Permission to leave phone message Yes     Patient questions:  Message left to call if necessary.

## 2011-09-11 ENCOUNTER — Encounter: Payer: Self-pay | Admitting: Internal Medicine

## 2011-09-13 ENCOUNTER — Ambulatory Visit (INDEPENDENT_AMBULATORY_CARE_PROVIDER_SITE_OTHER): Payer: 59 | Admitting: Internal Medicine

## 2011-09-13 ENCOUNTER — Telehealth: Payer: Self-pay | Admitting: Internal Medicine

## 2011-09-13 ENCOUNTER — Encounter: Payer: Self-pay | Admitting: Internal Medicine

## 2011-09-13 VITALS — BP 140/80 | HR 80 | Temp 98.7°F | Resp 16 | Wt 172.0 lb

## 2011-09-13 DIAGNOSIS — M545 Low back pain, unspecified: Secondary | ICD-10-CM | POA: Insufficient documentation

## 2011-09-13 DIAGNOSIS — IMO0001 Reserved for inherently not codable concepts without codable children: Secondary | ICD-10-CM

## 2011-09-13 DIAGNOSIS — R5381 Other malaise: Secondary | ICD-10-CM

## 2011-09-13 DIAGNOSIS — E538 Deficiency of other specified B group vitamins: Secondary | ICD-10-CM

## 2011-09-13 DIAGNOSIS — M797 Fibromyalgia: Secondary | ICD-10-CM

## 2011-09-13 DIAGNOSIS — R5383 Other fatigue: Secondary | ICD-10-CM

## 2011-09-13 DIAGNOSIS — G4700 Insomnia, unspecified: Secondary | ICD-10-CM

## 2011-09-13 MED ORDER — DULOXETINE HCL 60 MG PO CPEP: 60.0000 mg | ORAL_CAPSULE | Freq: Every day | ORAL | Status: DC

## 2011-09-13 MED ORDER — VITAMIN D 1000 UNITS PO TABS: 1000.0000 [IU] | ORAL_TABLET | Freq: Every day | ORAL | Status: AC

## 2011-09-13 MED ORDER — CYCLOBENZAPRINE HCL 5 MG PO TABS: 5.0000 mg | ORAL_TABLET | Freq: Every evening | ORAL | Status: AC | PRN

## 2011-09-13 MED ORDER — HYDROCODONE-ACETAMINOPHEN 7.5-325 MG PO TABS: 1.0000 | ORAL_TABLET | Freq: Four times a day (QID) | ORAL | Status: AC | PRN

## 2011-09-13 NOTE — Patient Instructions (Signed)
Try gluten free diet x 6 weeks

## 2011-09-13 NOTE — Progress Notes (Signed)
Patient ID: Christine Duncan, female   DOB: 05/17/1980, 31 y.o.   MRN: [id number]  Subjective:    Patient ID: Christine Duncan, female    DOB: 01/13/1981, 31 y.o.   MRN: [id number]  Back Pain This is a chronic problem. The current episode started more than 1 month ago. The problem occurs intermittently. The problem has been waxing and waning since onset. The pain is present in the lumbar spine. The quality of the pain is described as aching. The pain is at a severity of 7/10. The pain is moderate. The symptoms are aggravated by bending. Associated symptoms include headaches. Pertinent negatives include no chest pain, fever, numbness or weakness. The treatment provided mild relief.  Headache  Associated symptoms include back pain. Pertinent negatives include no coughing, fever, numbness or weakness.    F/u of complains of overwhelming exhaustion and hypersomnia - a little better on Cymbalta Fatigue despite sleep - never rested +snoring most nights - denies OSA Myalgias and stiffness without swelling in joints - affects back and neck - better Takes klonopin 1-3x/day for tremor symptoms - no other med changes Denies overlap with chronic biploar symptoms   Past Medical History  Diagnosis Date  . Blood type, Rh negative   . IBS (irritable bowel syndrome)   . Acid reflux   . Bipolar 1 disorder   . PIH (pregnancy induced hypertension), antepartum     icu after delivery and currently hypertensive  . B12 deficiency   . Spastic colon   . Hives   . Essential tremor   . Allergy   . Anemia   . Anxiety   . Depression   . Hyperlipidemia   . Neuromuscular disorder     fibromylagia    Review of Systems  Constitutional: Positive for fatigue. Negative for fever and unexpected weight change.  Respiratory: Negative for cough and shortness of breath.   Cardiovascular: Negative for chest pain and palpitations.  Musculoskeletal: Positive for myalgias and back pain. Negative for joint swelling.  Arthralgias: better.  Neurological: Positive for headaches. Negative for weakness and numbness.       Objective:   Physical Exam  Constitutional: She appears well-developed. No distress.  HENT:  Head: Normocephalic.  Right Ear: External ear normal.  Left Ear: External ear normal.  Nose: Nose normal.  Mouth/Throat: Oropharynx is clear and moist.  Eyes: Conjunctivae are normal. Pupils are equal, round, and reactive to light. Right eye exhibits no discharge. Left eye exhibits no discharge.  Neck: Normal range of motion. Neck supple. No JVD present. No tracheal deviation present. No thyromegaly present.  Cardiovascular: Normal rate, regular rhythm and normal heart sounds.   Pulmonary/Chest: No stridor. No respiratory distress. She has no wheezes.  Abdominal: Soft. Bowel sounds are normal. She exhibits no distension and no mass. There is no tenderness. There is no rebound and no guarding.  Musculoskeletal: She exhibits tenderness. She exhibits no edema.  Lymphadenopathy:    She has no cervical adenopathy.  Neurological: She displays normal reflexes. No cranial nerve deficit. She exhibits normal muscle tone. Coordination normal.  Skin: No rash noted. No erythema.  Psychiatric: She has a normal mood and affect. Her behavior is normal. Judgment and thought content normal.   BP 140/80  Pulse 80  Temp 98.7 F (37.1 C) (Oral)  Resp 16  Wt 172 lb (78.019 kg) Wt Readings from Last 3 Encounters:  09/13/11 172 lb (78.019 kg)  09/06/11 173 lb (78.472 kg)  09/01/11 176 lb (79.833 kg)  Constitutional: She appears well-developed and well-nourished. No distress.  mom at side Neck: Normal range of motion. Neck supple. No JVD present. No thyromegaly present.  Cardiovascular: Normal rate, regular rhythm and normal heart sounds.  No murmur heard. No BLE edema. Pulmonary/Chest: Effort normal and breath sounds normal. No respiratory distress. She has no wheezes.  Musculoskeletal: tender points  posterior neck, B shoulder blades, anterior clavicle. Normal range of motion, no joint effusions. No gross deformities Neurological: She is alert and oriented to person, place, and time. No cranial nerve deficit. Coordination normal.  Skin: Skin is warm and dry. No rash noted. No erythema.  Psychiatric: She has a dysphoric mood and affect. Her behavior is normal. Judgment and thought content normal.   Lab Results  Component Value Date   WBC 6.4 09/01/2011   HGB 14.0 09/01/2011   HCT 39.9 09/01/2011   PLT 276 09/01/2011   GLUCOSE 93 03/24/2010   ALT 47* 10/15/2009   AST 20 10/15/2009   NA 140 03/24/2010   K 3.4* 03/24/2010   CL 99 03/24/2010   CREATININE 0.86 03/24/2010   BUN 14 03/24/2010   CO2 30 03/24/2010   TSH 0.513 09/01/2011       Assessment & Plan:   Fatigue/myalgia - ?FM, CFS Also consider sleep study for hypersomnia - she declined

## 2011-09-13 NOTE — Assessment & Plan Note (Signed)
Flexeril at hs 

## 2011-09-13 NOTE — Assessment & Plan Note (Signed)
Increase Cymbalta to 60 mg/d Labs

## 2011-09-13 NOTE — Telephone Encounter (Signed)
Caller: Jaunice/Patient; Patient Name: Christine Duncan; PCP: Plotnikov, Alex (Adults only); Best Callback Phone Number: [phone]. Caller reports she was seen on 8/16 and diagnosed with Fibromylagia. She was given Cymbalta to help with sxs. Caller reports some sxs are improved, ie neck and shoulder pain. Back pain is worse and she had to leave work due to same. Taking Ibuprofen with no relief of pain. EPIC chart reviewed. LMP-none  per Back Symptoms Protocol, Evaluated by provider and no improvement in symptoms after following treatment plan for the time specified by Provider, See in 72 hrs. RN advised caller she should be seen for evaluation of back pain. She is agreeable, but declined offer of 16:15 appt this afternoon, Wed 8/28. Asking to be seen Thursday 8/29 instead. Home care for sxs given. Appointment scheduled for Thursday 8/29 at 11:15 with PCP. Caller is agreeable.

## 2011-09-13 NOTE — Assessment & Plan Note (Signed)
Continue with current prescription therapy as reflected on the Med list.  

## 2011-09-14 ENCOUNTER — Ambulatory Visit: Payer: 59 | Admitting: Internal Medicine

## 2011-09-27 ENCOUNTER — Ambulatory Visit: Payer: 59 | Admitting: Internal Medicine

## 2011-09-28 ENCOUNTER — Telehealth: Payer: Self-pay | Admitting: Internal Medicine

## 2011-09-28 ENCOUNTER — Encounter: Payer: Self-pay | Admitting: Gastroenterology

## 2011-09-28 NOTE — Telephone Encounter (Signed)
Discussed results letter with pt. Pt states she never got the letter. Follow-up visit discussed with pt and she states she never got that letter either. Reviewed address with pt and correct address is in the chart. Pt states she will call back and reschedule the OV appt.

## 2011-10-04 ENCOUNTER — Telehealth: Payer: Self-pay | Admitting: *Deleted

## 2011-10-04 DIAGNOSIS — Z0279 Encounter for issue of other medical certificate: Secondary | ICD-10-CM

## 2011-10-04 MED ORDER — CLONAZEPAM 1 MG PO TBDP: 1.0000 mg | ORAL_TABLET | Freq: Three times a day (TID) | ORAL | Status: DC | PRN

## 2011-10-04 NOTE — Telephone Encounter (Signed)
OK to fill this prescription with additional refills x3 Thank you!  

## 2011-10-04 NOTE — Telephone Encounter (Signed)
Rf erq for Clonazepam 1 mg . Last dispensed 09/13/11. Ok to Rf?

## 2011-10-04 NOTE — Telephone Encounter (Signed)
Done

## 2011-10-09 ENCOUNTER — Ambulatory Visit: Payer: 59 | Admitting: Internal Medicine

## 2011-10-13 ENCOUNTER — Other Ambulatory Visit: Payer: Self-pay | Admitting: Gynecology

## 2011-10-25 ENCOUNTER — Ambulatory Visit (INDEPENDENT_AMBULATORY_CARE_PROVIDER_SITE_OTHER): Payer: 59 | Admitting: Internal Medicine

## 2011-10-25 ENCOUNTER — Encounter: Payer: Self-pay | Admitting: Internal Medicine

## 2011-10-25 VITALS — BP 130/90 | HR 80 | Temp 98.5°F | Resp 16 | Wt 166.0 lb

## 2011-10-25 DIAGNOSIS — IMO0001 Reserved for inherently not codable concepts without codable children: Secondary | ICD-10-CM

## 2011-10-25 DIAGNOSIS — I10 Essential (primary) hypertension: Secondary | ICD-10-CM

## 2011-10-25 DIAGNOSIS — L02429 Furuncle of limb, unspecified: Secondary | ICD-10-CM

## 2011-10-25 DIAGNOSIS — M545 Low back pain, unspecified: Secondary | ICD-10-CM

## 2011-10-25 DIAGNOSIS — L509 Urticaria, unspecified: Secondary | ICD-10-CM

## 2011-10-25 DIAGNOSIS — E538 Deficiency of other specified B group vitamins: Secondary | ICD-10-CM

## 2011-10-25 DIAGNOSIS — M797 Fibromyalgia: Secondary | ICD-10-CM

## 2011-10-25 MED ORDER — DOXYCYCLINE HYCLATE 100 MG PO TABS: 100.0000 mg | ORAL_TABLET | Freq: Two times a day (BID) | ORAL | Status: DC

## 2011-10-25 MED ORDER — FLUCONAZOLE 150 MG PO TABS: 150.0000 mg | ORAL_TABLET | Freq: Once | ORAL | Status: DC

## 2011-10-25 MED ORDER — OMEPRAZOLE 20 MG PO CPDR: 40.0000 mg | DELAYED_RELEASE_CAPSULE | Freq: Every day | ORAL | Status: DC

## 2011-10-25 NOTE — Assessment & Plan Note (Signed)
Continue with current prescription therapy as reflected on the Med list.  

## 2011-10-25 NOTE — Progress Notes (Signed)
Subjective:    Patient ID: Christine Duncan, female    DOB: May 25, 1980, 31 y.o.   MRN: [id number]  Back Pain This is a chronic problem. The current episode started more than 1 month ago. The problem occurs intermittently. The problem has been waxing and waning since onset. The pain is present in the lumbar spine. The quality of the pain is described as aching. The pain is at a severity of 6/10. The pain is moderate. The symptoms are aggravated by bending. Stiffness is present in the morning. Associated symptoms include headaches. Pertinent negatives include no chest pain, fever, numbness or weakness. Treatments tried: flexeril helps a little; hydrocodone helps better. The treatment provided moderate relief.  Headache  This is a recurrent problem. The current episode started more than 1 year ago. The problem has been gradually improving. Associated symptoms include back pain. Pertinent negatives include no coughing, fever, numbness or weakness.    F/u of complains of overwhelming exhaustion and hypersomnia - better on Cymbalta Fatigue despite sleep - better +snoring most nights - denies OSA Myalgias and stiffness without swelling in joints - affects back and neck - better, but not by much (flexeril helped, had to take 2 Norco a day lately) Takes less klonopin 1-3x/day for tremor symptoms - no other med changes Denies overlap with chronic biploar symptoms C/o a boil R upper LE  Past Medical History  Diagnosis Date  . Blood type, Rh negative   . IBS (irritable bowel syndrome)   . Acid reflux   . Bipolar 1 disorder   . PIH (pregnancy induced hypertension), antepartum     icu after delivery and currently hypertensive  . B12 deficiency   . Spastic colon   . Hives   . Essential tremor   . Allergy   . Anemia   . Anxiety   . Depression   . Hyperlipidemia   . Neuromuscular disorder     fibromylagia    Review of Systems  Constitutional: Positive for fatigue. Negative for fever and  unexpected weight change.  Respiratory: Negative for cough and shortness of breath.   Cardiovascular: Negative for chest pain and palpitations.  Musculoskeletal: Positive for myalgias and back pain. Negative for joint swelling. Arthralgias: better.  Neurological: Positive for headaches. Negative for weakness and numbness.       Objective:   Physical Exam  Constitutional: She appears well-developed. No distress.  HENT:  Head: Normocephalic.  Right Ear: External ear normal.  Left Ear: External ear normal.  Nose: Nose normal.  Mouth/Throat: Oropharynx is clear and moist.  Eyes: Conjunctivae normal are normal. Pupils are equal, round, and reactive to light. Right eye exhibits no discharge. Left eye exhibits no discharge.  Neck: Normal range of motion. Neck supple. No JVD present. No tracheal deviation present. No thyromegaly present.  Cardiovascular: Normal rate, regular rhythm and normal heart sounds.   Pulmonary/Chest: No stridor. No respiratory distress. She has no wheezes.  Abdominal: Soft. Bowel sounds are normal. She exhibits no distension and no mass. There is no tenderness. There is no rebound and no guarding.  Musculoskeletal: She exhibits tenderness. She exhibits no edema.  Lymphadenopathy:    She has no cervical adenopathy.  Neurological: She displays normal reflexes. No cranial nerve deficit. She exhibits normal muscle tone. Coordination normal.  Skin: No rash noted. No erythema.  Psychiatric: She has a normal mood and affect. Her behavior is normal. Judgment and thought content normal.   BP 130/90  Pulse 80  Temp 98.5 F (  36.9 C) (Oral)  Resp 16  Wt 166 lb (75.297 kg) Wt Readings from Last 3 Encounters:  10/25/11 166 lb (75.297 kg)  09/13/11 172 lb (78.019 kg)  09/06/11 173 lb (78.472 kg)    6 mm boil R inner prox thigh  Lab Results  Component Value Date   WBC 6.4 09/01/2011   HGB 14.0 09/01/2011   HCT 39.9 09/01/2011   PLT 276 09/01/2011   GLUCOSE 93 03/24/2010    ALT 47* 10/15/2009   AST 20 10/15/2009   NA 140 03/24/2010   K 3.4* 03/24/2010   CL 99 03/24/2010   CREATININE 0.86 03/24/2010   BUN 14 03/24/2010   CO2 30 03/24/2010   TSH 0.513 09/01/2011       Assessment & Plan:

## 2011-10-25 NOTE — Assessment & Plan Note (Addendum)
Continue with current prescription therapy as reflected on the Med list. Start yoga

## 2011-10-25 NOTE — Assessment & Plan Note (Signed)
IgE and IgG blood test for allergies

## 2011-10-25 NOTE — Assessment & Plan Note (Signed)
Doing well 

## 2011-10-25 NOTE — Assessment & Plan Note (Signed)
flexeril prn Start yoga Try gluten free diet

## 2011-11-22 ENCOUNTER — Other Ambulatory Visit: Payer: Self-pay | Admitting: Gynecology

## 2011-11-22 MED ORDER — NORETHIN ACE-ETH ESTRAD-FE 1-20 MG-MCG PO TABS: 1.0000 | ORAL_TABLET | Freq: Every day | ORAL | Status: DC

## 2011-12-18 ENCOUNTER — Telehealth: Payer: Self-pay | Admitting: *Deleted

## 2011-12-18 NOTE — Telephone Encounter (Signed)
OK to fill this prescription with additional refills x0 Thank you!  

## 2011-12-18 NOTE — Telephone Encounter (Signed)
Rf req for Hydroco/APAP 7.5/325. Last filled 11/03/2011. Ok to Rf?

## 2011-12-19 MED ORDER — HYDROCODONE-ACETAMINOPHEN 7.5-325 MG PO TABS: 1.0000 | ORAL_TABLET | Freq: Four times a day (QID) | ORAL | Status: DC | PRN

## 2011-12-19 NOTE — Telephone Encounter (Signed)
Done

## 2011-12-27 ENCOUNTER — Encounter: Payer: Self-pay | Admitting: Internal Medicine

## 2011-12-27 ENCOUNTER — Ambulatory Visit (INDEPENDENT_AMBULATORY_CARE_PROVIDER_SITE_OTHER): Payer: 59 | Admitting: Internal Medicine

## 2011-12-27 VITALS — BP 130/68 | HR 80 | Temp 98.1°F | Resp 16 | Wt 170.0 lb

## 2011-12-27 DIAGNOSIS — M797 Fibromyalgia: Secondary | ICD-10-CM

## 2011-12-27 DIAGNOSIS — R259 Unspecified abnormal involuntary movements: Secondary | ICD-10-CM

## 2011-12-27 DIAGNOSIS — F319 Bipolar disorder, unspecified: Secondary | ICD-10-CM

## 2011-12-27 DIAGNOSIS — IMO0001 Reserved for inherently not codable concepts without codable children: Secondary | ICD-10-CM

## 2011-12-27 DIAGNOSIS — I10 Essential (primary) hypertension: Secondary | ICD-10-CM

## 2011-12-27 DIAGNOSIS — M545 Low back pain, unspecified: Secondary | ICD-10-CM

## 2011-12-27 DIAGNOSIS — G250 Essential tremor: Secondary | ICD-10-CM

## 2011-12-27 DIAGNOSIS — E538 Deficiency of other specified B group vitamins: Secondary | ICD-10-CM

## 2011-12-27 DIAGNOSIS — G253 Myoclonus: Secondary | ICD-10-CM

## 2011-12-27 DIAGNOSIS — L509 Urticaria, unspecified: Secondary | ICD-10-CM

## 2011-12-27 NOTE — Assessment & Plan Note (Signed)
Continue with current prescription therapy as reflected on the Med list.  

## 2011-12-27 NOTE — Assessment & Plan Note (Signed)
On Cymbalta 

## 2011-12-27 NOTE — Assessment & Plan Note (Signed)
No bad relapses lately - 1-2 small episodes/wk

## 2011-12-27 NOTE — Progress Notes (Signed)
Subjective:    Patient ID: Christine Duncan, female    DOB: 1980-11-27, 31 y.o.   MRN: [id number]  Back Pain This is a chronic problem. The current episode started more than 1 month ago. The problem occurs intermittently. The problem has been gradually improving since onset. The pain is present in the lumbar spine. The quality of the pain is described as aching. The pain is at a severity of 4/10. The pain is moderate. The symptoms are aggravated by bending. Stiffness is present in the morning. Associated symptoms include headaches. Pertinent negatives include no chest pain, fever, numbness or weakness. Treatments tried: flexeril helps a little; hydrocodone helps better. The treatment provided moderate relief.  Headache  This is a recurrent problem. The current episode started more than 1 year ago. The problem has been gradually improving. The pain is mild. Associated symptoms include back pain. Pertinent negatives include no coughing, fever, numbness or weakness. The treatment provided moderate relief.    F/u of complains of overwhelming exhaustion and hypersomnia - better on Cymbalta Fatigue despite sleep - better +snoring most nights - denies OSA Myalgias and stiffness without swelling in joints - affects back and neck - better, but not by much (flexeril helped, had to take 2 Norco a day lately) Takes less klonopin 1-3x/day for tremor symptoms - no other med changes Denies overlap with chronic biploar symptoms C/o a boil R upper LE  Past Medical History  Diagnosis Date  . Blood type, Rh negative   . IBS (irritable bowel syndrome)   . Acid reflux   . Bipolar 1 disorder   . PIH (pregnancy induced hypertension), antepartum     icu after delivery and currently hypertensive  . B12 deficiency   . Spastic colon   . Hives   . Essential tremor   . Allergy   . Anemia   . Anxiety   . Depression   . Hyperlipidemia   . Neuromuscular disorder     fibromylagia    Review of Systems   Constitutional: Positive for fatigue. Negative for fever and unexpected weight change.  Respiratory: Negative for cough and shortness of breath.   Cardiovascular: Negative for chest pain and palpitations.  Musculoskeletal: Positive for myalgias and back pain. Negative for joint swelling. Arthralgias: better.  Neurological: Positive for headaches. Negative for weakness and numbness.       Objective:   Physical Exam  Constitutional: She appears well-developed. No distress.  HENT:  Head: Normocephalic.  Right Ear: External ear normal.  Left Ear: External ear normal.  Nose: Nose normal.  Mouth/Throat: Oropharynx is clear and moist.  Eyes: Conjunctivae normal are normal. Pupils are equal, round, and reactive to light. Right eye exhibits no discharge. Left eye exhibits no discharge.  Neck: Normal range of motion. Neck supple. No JVD present. No tracheal deviation present. No thyromegaly present.  Cardiovascular: Normal rate, regular rhythm and normal heart sounds.   Pulmonary/Chest: No stridor. No respiratory distress. She has no wheezes.  Abdominal: Soft. Bowel sounds are normal. She exhibits no distension and no mass. There is no tenderness. There is no rebound and no guarding.  Musculoskeletal: She exhibits tenderness. She exhibits no edema.  Lymphadenopathy:    She has no cervical adenopathy.  Neurological: She displays normal reflexes. No cranial nerve deficit. She exhibits normal muscle tone. Coordination normal.  Skin: No rash noted. No erythema.  Psychiatric: She has a normal mood and affect. Her behavior is normal. Judgment and thought content normal.  BP 130/68  Pulse 80  Temp 98.1 F (36.7 C) (Oral)  Resp 16  Wt 170 lb (77.111 kg) Wt Readings from Last 3 Encounters:  12/27/11 170 lb (77.111 kg)  10/25/11 166 lb (75.297 kg)  09/13/11 172 lb (78.019 kg)    6 mm boil R inner prox thigh  Lab Results  Component Value Date   WBC 6.4 09/01/2011   HGB 14.0 09/01/2011    HCT 39.9 09/01/2011   PLT 276 09/01/2011   GLUCOSE 93 03/24/2010   ALT 47* 10/15/2009   AST 20 10/15/2009   NA 140 03/24/2010   K 3.4* 03/24/2010   CL 99 03/24/2010   CREATININE 0.86 03/24/2010   BUN 14 03/24/2010   CO2 30 03/24/2010   TSH 0.513 09/01/2011       Assessment & Plan:

## 2011-12-27 NOTE — Assessment & Plan Note (Signed)
Clonazepam is helping prn

## 2011-12-27 NOTE — Assessment & Plan Note (Signed)
Clonazepam 

## 2011-12-27 NOTE — Assessment & Plan Note (Signed)
On Cymbalta Continue with current prescription therapy as reflected on the Med list.

## 2012-01-07 ENCOUNTER — Other Ambulatory Visit: Payer: Self-pay | Admitting: Internal Medicine

## 2012-02-13 ENCOUNTER — Telehealth: Payer: Self-pay | Admitting: *Deleted

## 2012-02-13 MED ORDER — NORETHIN ACE-ETH ESTRAD-FE 1-20 MG-MCG PO TABS: 1.0000 | ORAL_TABLET | Freq: Every day | ORAL | Status: DC

## 2012-02-13 NOTE — Telephone Encounter (Signed)
Pt has new mail order pharmacy [facility] and would like her birth control pill sent there stating she takes them continuously. Rx sent. Annual due in July this year.

## 2012-02-21 ENCOUNTER — Telehealth: Payer: Self-pay | Admitting: Internal Medicine

## 2012-02-21 NOTE — Telephone Encounter (Signed)
Patient changed insurance and now she needs her medications sent to [facility], she needs refills on her muscle relaxer, cymbalta, and omeprazole. These can be faxed in to [phone]

## 2012-02-23 MED ORDER — OMEPRAZOLE 20 MG PO CPDR: 40.0000 mg | DELAYED_RELEASE_CAPSULE | Freq: Every day | ORAL | Status: DC

## 2012-02-23 MED ORDER — DULOXETINE HCL 60 MG PO CPEP: 60.0000 mg | ORAL_CAPSULE | Freq: Every day | ORAL | Status: DC

## 2012-02-23 MED ORDER — CYCLOBENZAPRINE HCL 5 MG PO TABS: 5.0000 mg | ORAL_TABLET | Freq: Three times a day (TID) | ORAL | Status: DC | PRN

## 2012-02-23 NOTE — Telephone Encounter (Signed)
Maintenance meds sent. Please advise ok to Rf Flexeril?

## 2012-02-23 NOTE — Telephone Encounter (Signed)
Done

## 2012-02-23 NOTE — Telephone Encounter (Signed)
OK to fill  prescriptions with additional refills x3 Thank you!

## 2012-03-06 ENCOUNTER — Telehealth: Payer: Self-pay | Admitting: *Deleted

## 2012-03-06 MED ORDER — CLONAZEPAM 1 MG PO TBDP: 1.0000 mg | ORAL_TABLET | Freq: Three times a day (TID) | ORAL | Status: DC | PRN

## 2012-03-06 NOTE — Telephone Encounter (Signed)
OK to fill this prescription with additional refills x2 Sch OV Thank you!  

## 2012-03-06 NOTE — Telephone Encounter (Signed)
Rf req for Clonazepam 1 mg. Last dispensed 02/04/12.

## 2012-03-06 NOTE — Telephone Encounter (Signed)
Done

## 2012-04-03 ENCOUNTER — Telehealth: Payer: Self-pay | Admitting: Internal Medicine

## 2012-04-03 NOTE — Telephone Encounter (Signed)
Patients pharmacy, [facility], Received the refills for her klonopin but they are not for the dissolvable tab that she is used to, she is requesting that we call [facility] and let them know that it is OK for her to have the dissolvable tabs that she is used to

## 2012-04-04 NOTE — Telephone Encounter (Signed)
Ok Thx 

## 2012-04-08 NOTE — Telephone Encounter (Signed)
Pharmacy informed to dispense disintegrating tab.

## 2012-04-29 ENCOUNTER — Other Ambulatory Visit: Payer: Self-pay | Admitting: *Deleted

## 2012-04-29 ENCOUNTER — Other Ambulatory Visit: Payer: 59

## 2012-04-29 ENCOUNTER — Ambulatory Visit (INDEPENDENT_AMBULATORY_CARE_PROVIDER_SITE_OTHER): Payer: 59 | Admitting: Internal Medicine

## 2012-04-29 ENCOUNTER — Encounter: Payer: Self-pay | Admitting: Internal Medicine

## 2012-04-29 VITALS — BP 120/70 | HR 72 | Temp 98.1°F | Resp 16 | Wt 175.0 lb

## 2012-04-29 DIAGNOSIS — G253 Myoclonus: Secondary | ICD-10-CM

## 2012-04-29 DIAGNOSIS — E538 Deficiency of other specified B group vitamins: Secondary | ICD-10-CM

## 2012-04-29 DIAGNOSIS — M545 Low back pain, unspecified: Secondary | ICD-10-CM

## 2012-04-29 DIAGNOSIS — F319 Bipolar disorder, unspecified: Secondary | ICD-10-CM

## 2012-04-29 DIAGNOSIS — L509 Urticaria, unspecified: Secondary | ICD-10-CM

## 2012-04-29 DIAGNOSIS — M797 Fibromyalgia: Secondary | ICD-10-CM

## 2012-04-29 DIAGNOSIS — IMO0001 Reserved for inherently not codable concepts without codable children: Secondary | ICD-10-CM

## 2012-04-29 LAB — BASIC METABOLIC PANEL
Calcium: 9.1 mg/dL (ref 8.4–10.5)
Creat: 0.83 mg/dL (ref 0.50–1.10)

## 2012-04-29 LAB — HEPATIC FUNCTION PANEL
ALT: 16 U/L (ref 0–35)
AST: 17 U/L (ref 0–37)
Albumin: 4 g/dL (ref 3.5–5.2)
Alkaline Phosphatase: 46 U/L (ref 39–117)
Total Protein: 7.2 g/dL (ref 6.0–8.3)

## 2012-04-29 LAB — CBC WITH DIFFERENTIAL/PLATELET
Eosinophils Absolute: 0.2 10*3/uL (ref 0.0–0.7)
HCT: 40.3 % (ref 36.0–46.0)
Hemoglobin: 13.6 g/dL (ref 12.0–15.0)
Lymphs Abs: 2.6 10*3/uL (ref 0.7–4.0)
MCH: 29.8 pg (ref 26.0–34.0)
Monocytes Absolute: 0.4 10*3/uL (ref 0.1–1.0)
Monocytes Relative: 6 % (ref 3–12)
Neutrophils Relative %: 56 % (ref 43–77)
RBC: 4.56 MIL/uL (ref 3.87–5.11)

## 2012-04-29 LAB — TSH: TSH: 0.622 u[IU]/mL (ref 0.350–4.500)

## 2012-04-29 LAB — FOLLICLE STIMULATING HORMONE: FSH: 1 m[IU]/mL

## 2012-04-29 MED ORDER — HYDROCODONE-ACETAMINOPHEN 7.5-325 MG PO TABS: 1.0000 | ORAL_TABLET | Freq: Four times a day (QID) | ORAL | Status: DC | PRN

## 2012-04-29 MED ORDER — CLONAZEPAM 1 MG PO TBDP: 1.0000 mg | ORAL_TABLET | Freq: Three times a day (TID) | ORAL | Status: DC | PRN

## 2012-04-29 NOTE — Assessment & Plan Note (Signed)
Continue with current prescription therapy as reflected on the Med list.  

## 2012-04-29 NOTE — Progress Notes (Signed)
Subjective:   C/o sweats, esp night sweats - worse x 2 mo C/o increased appetite and wt gain x few wks  Back Pain This is a chronic problem. The current episode started more than 1 month ago. The problem occurs intermittently. The problem has been gradually improving since onset. The pain is present in the lumbar spine. The quality of the pain is described as aching. The pain is at a severity of 4/10. The pain is moderate. The symptoms are aggravated by bending. Stiffness is present in the morning. Associated symptoms include headaches. Pertinent negatives include no chest pain, fever, numbness or weakness. Treatments tried: flexeril helps a little; hydrocodone helps better. The treatment provided moderate relief.  Headache  This is a recurrent problem. The current episode started more than 1 year ago. The problem has been gradually improving. The pain is mild. Associated symptoms include back pain. Pertinent negatives include no coughing, fever, numbness or weakness. The treatment provided moderate relief.    F/u of complains of overwhelming exhaustion and hypersomnia - better on Cymbalta Fatigue despite sleep - better +snoring most nights - denies OSA Myalgias and stiffness without swelling in joints - affects back and neck - better, but not by much (flexeril helped, had to take 2 Norco a day lately) Takes less klonopin 1-3x/day for tremor symptoms - no other med changes Denies overlap with chronic biploar symptoms  Wt Readings from Last 3 Encounters:  04/29/12 175 lb (79.379 kg)  12/27/11 170 lb (77.111 kg)  10/25/11 166 lb (75.297 kg)   BP Readings from Last 3 Encounters:  04/29/12 120/70  12/27/11 130/68  10/25/11 130/90      Past Medical History  Diagnosis Date  . Blood type, Rh negative   . IBS (irritable bowel syndrome)   . Acid reflux   . Bipolar 1 disorder   . PIH (pregnancy induced hypertension), antepartum     icu after delivery and currently hypertensive  . B12  deficiency   . Spastic colon   . Hives   . Essential tremor   . Allergy   . Anemia   . Anxiety   . Depression   . Hyperlipidemia   . Neuromuscular disorder     fibromylagia    Review of Systems  Constitutional: Positive for fatigue. Negative for fever and unexpected weight change.  Respiratory: Negative for cough and shortness of breath.   Cardiovascular: Negative for chest pain and palpitations.  Musculoskeletal: Positive for myalgias and back pain. Negative for joint swelling. Arthralgias: better.  Neurological: Positive for headaches. Negative for weakness and numbness.       Objective:   Physical Exam  Constitutional: She appears well-developed. No distress.  HENT:  Head: Normocephalic.  Right Ear: External ear normal.  Left Ear: External ear normal.  Nose: Nose normal.  Mouth/Throat: Oropharynx is clear and moist.  Eyes: Conjunctivae are normal. Pupils are equal, round, and reactive to light. Right eye exhibits no discharge. Left eye exhibits no discharge.  Neck: Normal range of motion. Neck supple. No JVD present. No tracheal deviation present. No thyromegaly present.  Cardiovascular: Normal rate, regular rhythm and normal heart sounds.   Pulmonary/Chest: No stridor. No respiratory distress. She has no wheezes.  Abdominal: Soft. Bowel sounds are normal. She exhibits no distension and no mass. There is no tenderness. There is no rebound and no guarding.  Musculoskeletal: She exhibits tenderness. She exhibits no edema.  Lymphadenopathy:    She has no cervical adenopathy.  Neurological: She displays  normal reflexes. No cranial nerve deficit. She exhibits normal muscle tone. Coordination normal.  Skin: No rash noted. No erythema.  Psychiatric: She has a normal mood and affect. Her behavior is normal. Judgment and thought content normal.   BP 120/70  Pulse 72  Temp(Src) 98.1 F (36.7 C) (Oral)  Resp 16  Wt 175 lb (79.379 kg)  BMI 28.26 kg/m2     Lab Results   Component Value Date   WBC 6.4 09/01/2011   HGB 14.0 09/01/2011   HCT 39.9 09/01/2011   PLT 276 09/01/2011   GLUCOSE 93 03/24/2010   ALT 47* 10/15/2009   AST 20 10/15/2009   NA 140 03/24/2010   K 3.4* 03/24/2010   CL 99 03/24/2010   CREATININE 0.86 03/24/2010   BUN 14 03/24/2010   CO2 30 03/24/2010   TSH 0.513 09/01/2011       Assessment & Plan:

## 2012-04-29 NOTE — Assessment & Plan Note (Signed)
Better  

## 2012-04-30 ENCOUNTER — Other Ambulatory Visit: Payer: Self-pay | Admitting: Internal Medicine

## 2012-04-30 ENCOUNTER — Encounter: Payer: Self-pay | Admitting: Internal Medicine

## 2012-04-30 LAB — URINALYSIS, ROUTINE W REFLEX MICROSCOPIC
Bilirubin Urine: NEGATIVE
Glucose, UA: NEGATIVE mg/dL
Leukocytes, UA: NEGATIVE
pH: 6 (ref 5.0–8.0)

## 2012-04-30 MED ORDER — CYANOCOBALAMIN 1000 MCG SL SUBL: 1.0000 | SUBLINGUAL_TABLET | Freq: Every day | SUBLINGUAL | Status: DC

## 2012-05-03 ENCOUNTER — Telehealth: Payer: Self-pay | Admitting: Internal Medicine

## 2012-05-03 NOTE — Telephone Encounter (Signed)
Was told the nurse was going to call her back after the nurse spoke to dr about glucose levels, she not heard anything. Pt request call back

## 2012-05-06 NOTE — Telephone Encounter (Signed)
Pt informed

## 2012-05-06 NOTE — Telephone Encounter (Signed)
Pt sent an email stating she has been having dizzy spells and forgot to mention at last OV. I informed her of her recent lab results and she was asking if you thought her low blood sugar could be causing her dizziness. Last glucose was 58. Please advise.

## 2012-05-06 NOTE — Telephone Encounter (Signed)
It could be due to a number of reasons OV if not better Thx

## 2012-05-27 ENCOUNTER — Other Ambulatory Visit: Payer: Self-pay | Admitting: Internal Medicine

## 2012-05-29 ENCOUNTER — Other Ambulatory Visit: Payer: Self-pay | Admitting: Internal Medicine

## 2012-05-30 ENCOUNTER — Other Ambulatory Visit: Payer: Self-pay | Admitting: Internal Medicine

## 2012-05-30 DIAGNOSIS — G253 Myoclonus: Secondary | ICD-10-CM

## 2012-05-30 DIAGNOSIS — F319 Bipolar disorder, unspecified: Secondary | ICD-10-CM

## 2012-05-30 DIAGNOSIS — E538 Deficiency of other specified B group vitamins: Secondary | ICD-10-CM

## 2012-05-30 DIAGNOSIS — L509 Urticaria, unspecified: Secondary | ICD-10-CM

## 2012-05-30 NOTE — Telephone Encounter (Signed)
Ok to Rf Hydroco/APAP 7.5/325?

## 2012-05-30 NOTE — Telephone Encounter (Signed)
Pt called stating that she never got the hard copy for Hydrocodone on 04/29/12; pt also call her drug store and they do not have it. Please check because pt is out of this med and she stated that she has been trying to get this done for couple week now.

## 2012-05-30 NOTE — Telephone Encounter (Signed)
Pls use less Rx OV in 1 mo Thx

## 2012-05-31 MED ORDER — HYDROCODONE-ACETAMINOPHEN 7.5-325 MG PO TABS: 1.0000 | ORAL_TABLET | Freq: Four times a day (QID) | ORAL | Status: DC | PRN

## 2012-05-31 NOTE — Telephone Encounter (Signed)
Done-Pt informed of below. 

## 2012-06-04 ENCOUNTER — Ambulatory Visit (INDEPENDENT_AMBULATORY_CARE_PROVIDER_SITE_OTHER): Payer: 59 | Admitting: Family Medicine

## 2012-06-04 ENCOUNTER — Encounter: Payer: Self-pay | Admitting: Internal Medicine

## 2012-06-04 ENCOUNTER — Encounter: Payer: Self-pay | Admitting: Family Medicine

## 2012-06-04 VITALS — BP 110/74 | Temp 98.3°F | Wt 181.0 lb

## 2012-06-04 DIAGNOSIS — L509 Urticaria, unspecified: Secondary | ICD-10-CM

## 2012-06-04 MED ORDER — METHYLPREDNISOLONE ACETATE 80 MG/ML IJ SUSP
120.0000 mg | Freq: Once | INTRAMUSCULAR | Status: AC
  Administered 2012-06-04: 120 mg via INTRAMUSCULAR

## 2012-06-04 MED ORDER — HYDROXYZINE HCL 10 MG PO TABS: 10.0000 mg | ORAL_TABLET | Freq: Three times a day (TID) | ORAL | Status: AC | PRN

## 2012-06-04 NOTE — Progress Notes (Signed)
Chief Complaint  Patient presents with  . Urticaria    HPI:   Acute visit for hives: -hx recurrent, prior allergy workup neg, gets IM depo-medrol and used atarax in the past -usually stress induced, has to get steroid inj a few times per year - this works much better then the pills, has been about a year since last flare -recent flare -denies: SOB, mouth or tongue swelling, fevers, chills  ROS: See pertinent positives and negatives per HPI.  Past Medical History  Diagnosis Date  . Blood type, Rh negative   . IBS (irritable bowel syndrome)   . Acid reflux   . Bipolar 1 disorder   . PIH (pregnancy induced hypertension), antepartum     icu after delivery and currently hypertensive  . B12 deficiency   . Spastic colon   . Hives   . Essential tremor   . Allergy   . Anemia   . Anxiety   . Depression   . Hyperlipidemia   . Neuromuscular disorder     fibromylagia    Family History  Problem Relation Age of Onset  . Seizures Other   . Thyroid disease Mother   . Cancer Father 8    esoph ca  . Esophageal cancer Father   . Seizures Maternal Grandmother   . Thyroid disease Maternal Grandmother   . Hyperlipidemia Paternal Grandmother   . Diabetes Paternal Grandmother   . Breast cancer Paternal Grandmother   . Cancer Paternal Grandmother     Bladder and colon cancer  . Colon cancer Paternal Grandmother   . Rectal cancer Neg Hx   . Stomach cancer Neg Hx     History   Social History  . Marital Status: Married    Spouse Name: N/A    Number of Children: N/A  . Years of Education: N/A   Occupational History  . WORKS [employer]   Social History Main Topics  . Smoking status: Former Games developer  . Smokeless tobacco: Never Used  . Alcohol Use: Yes     Comment: Rare  . Drug Use: No  . Sexually Active: Not Currently    Birth Control/ Protection: Pill   Other Topics Concern  . None   Social History Narrative   Regular exercise- yes    Current  outpatient prescriptions:cholecalciferol (VITAMIN D) 1000 UNITS tablet, Take 1 tablet (1,000 Units total) by mouth daily., Disp: 100 tablet, Rfl: 3;  clonazePAM (KLONOPIN) 1 MG disintegrating tablet, Take 1 tablet (1 mg total) by mouth 3 (three) times daily as needed for anxiety., Disp: 90 tablet, Rfl: 2;  Cyanocobalamin 1000 MCG SUBL, Place 1 tablet (1,000 mcg total) under the tongue daily., Disp: 100 tablet, Rfl: 3 cyclobenzaprine (FLEXERIL) 5 MG tablet, Take 1 tablet (5 mg total) by mouth 3 (three) times daily as needed for muscle spasms., Disp: 270 tablet, Rfl: 2;  DULoxetine (CYMBALTA) 60 MG capsule, Take 1 capsule (60 mg total) by mouth daily., Disp: 90 capsule, Rfl: 3;  HYDROcodone-acetaminophen (NORCO) 7.5-325 MG per tablet, Take 1 tablet by mouth 4 (four) times daily as needed., Disp: 100 tablet, Rfl: 0 norethindrone-ethinyl estradiol (JUNEL FE,GILDESS FE,LOESTRIN FE) 1-20 MG-MCG tablet, Take 1 tablet by mouth daily., Disp: 3 Package, Rfl: 1;  omeprazole (PRILOSEC) 20 MG capsule, Take 2 capsules (40 mg total) by mouth daily., Disp: 180 capsule, Rfl: 3;  clidinium-chlordiazePOXIDE (LIBRAX) 2.5-5 MG per capsule, Take 1 capsule by mouth 3 (three) times daily as needed (as needed before meals).,  Disp: 100 capsule, Rfl: 6 hydrOXYzine (ATARAX/VISTARIL) 10 MG tablet, Take 1 tablet (10 mg total) by mouth 3 (three) times daily as needed for itching., Disp: 30 tablet, Rfl: 0  EXAM:  Filed Vitals:   06/04/12 1503  BP: 110/74  Temp: 98.3 F (36.8 C)    Body mass index is 29.23 kg/(m^2).  GENERAL: vitals reviewed and listed above, alert, oriented, appears well hydrated and in no acute distress  HEENT: atraumatic, conjunttiva clear, no obvious abnormalities on inspection of external nose and ears  NECK: no obvious masses on inspection  LUNGS: clear to auscultation bilaterally, no wheezes, rales or rhonchi, good air movement  CV: HRRR, no peripheral edema  MS: moves all extremities without  noticeable abnormality  SKIN: hives face, feet, trunk  PSYCH: pleasant and cooperative, no obvious depression or anxiety  ASSESSMENT AND PLAN:  Discussed the following assessment and plan:  Urticaria - Plan: hydrOXYzine (ATARAX/VISTARIL) 10 MG tablet  -hives, no breathing issues or oral swelling, recurrent, stress related -IM depomedrol after discussion risks -refilled atarax - risks discussed -follow up with PCP as needed -Patient advised to return or notify a doctor immediately if symptoms worsen or persist or new concerns arise.  There are no Patient Instructions on file for this visit.   Christine Basque R.

## 2012-06-04 NOTE — Addendum Note (Signed)
Addended by: Azucena Freed on: 06/04/2012 03:22 PM   Modules accepted: Orders

## 2012-07-24 ENCOUNTER — Telehealth: Payer: Self-pay | Admitting: *Deleted

## 2012-07-24 NOTE — Telephone Encounter (Signed)
Pt is currently taking loestrin 1/20 fe takes continuously, she has been bleeding x 12 days now. She has been taking her pills on time daily as directed. Has not done anything differently, this would be the first time this happened. Not a heavy flow, medium to light flow. Please advise

## 2012-07-24 NOTE — Telephone Encounter (Signed)
Pt informed with the below note. 

## 2012-07-24 NOTE — Telephone Encounter (Signed)
Recommend stopping pill for one week then restart a new pack. If irregular bleeding continues office visit for evaluation.

## 2012-08-01 ENCOUNTER — Ambulatory Visit (INDEPENDENT_AMBULATORY_CARE_PROVIDER_SITE_OTHER): Payer: 59 | Admitting: Internal Medicine

## 2012-08-01 ENCOUNTER — Encounter: Payer: Self-pay | Admitting: Internal Medicine

## 2012-08-01 VITALS — BP 124/82 | HR 93 | Temp 98.2°F

## 2012-08-01 DIAGNOSIS — F439 Reaction to severe stress, unspecified: Secondary | ICD-10-CM

## 2012-08-01 DIAGNOSIS — L509 Urticaria, unspecified: Secondary | ICD-10-CM

## 2012-08-01 DIAGNOSIS — Z733 Stress, not elsewhere classified: Secondary | ICD-10-CM

## 2012-08-01 MED ORDER — METHYLPREDNISOLONE ACETATE 80 MG/ML IJ SUSP
80.0000 mg | Freq: Once | INTRAMUSCULAR | Status: AC
  Administered 2012-08-01: 80 mg via INTRAMUSCULAR

## 2012-08-01 MED ORDER — METHYLPREDNISOLONE ACETATE 40 MG/ML IJ SUSP
40.0000 mg | Freq: Once | INTRAMUSCULAR | Status: AC
  Administered 2012-08-01: 40 mg via INTRAMUSCULAR

## 2012-08-01 NOTE — Addendum Note (Signed)
Addended by: Brenton Grills C on: 08/01/2012 10:55 AM   Modules accepted: Orders

## 2012-08-01 NOTE — Progress Notes (Signed)
Subjective:    Patient ID: Christine Duncan, female    DOB: [birth year], 32 y.o.   MRN: [id number]  HPI  Pt presents to the clinic today with c/o hives. This started yesterday. It is stress related. This is a chronic issue for her. This last occurred in 05/2012. She does take hydroxyzine which does calm it down a little bit. She usually has to get a steroid injection. Benadryl OTC does not help. This is not an allergic reaction.  Review of Systems  Past Medical History  Diagnosis Date  . Blood type, Rh negative   . IBS (irritable bowel syndrome)   . Acid reflux   . Bipolar 1 disorder   . PIH (pregnancy induced hypertension), antepartum     icu after delivery and currently hypertensive  . B12 deficiency   . Spastic colon   . Hives   . Essential tremor   . Allergy   . Anemia   . Anxiety   . Depression   . Hyperlipidemia   . Neuromuscular disorder     fibromylagia    Current Outpatient Prescriptions  Medication Sig Dispense Refill  . cholecalciferol (VITAMIN D) 1000 UNITS tablet Take 1 tablet (1,000 Units total) by mouth daily.  100 tablet  3  . clidinium-chlordiazePOXIDE (LIBRAX) 2.5-5 MG per capsule Take 1 capsule by mouth 3 (three) times daily as needed (as needed before meals).  100 capsule  6  . clonazePAM (KLONOPIN) 1 MG disintegrating tablet Take 1 tablet (1 mg total) by mouth 3 (three) times daily as needed for anxiety.  90 tablet  2  . Cyanocobalamin 1000 MCG SUBL Place 1 tablet (1,000 mcg total) under the tongue daily.  100 tablet  3  . cyclobenzaprine (FLEXERIL) 5 MG tablet Take 1 tablet (5 mg total) by mouth 3 (three) times daily as needed for muscle spasms.  270 tablet  2  . DULoxetine (CYMBALTA) 60 MG capsule Take 1 capsule (60 mg total) by mouth daily.  90 capsule  3  . HYDROcodone-acetaminophen (NORCO) 7.5-325 MG per tablet Take 1 tablet by mouth 4 (four) times daily as needed.  100 tablet  0  . norethindrone-ethinyl estradiol (JUNEL FE,GILDESS FE,LOESTRIN FE) 1-20  MG-MCG tablet Take 1 tablet by mouth daily.  3 Package  1  . omeprazole (PRILOSEC) 20 MG capsule Take 2 capsules (40 mg total) by mouth daily.  180 capsule  3   No current facility-administered medications for this visit.    Allergies  Allergen Reactions  . Carbidopa-Levodopa     REACTION: nausea  . Sertraline Hcl     REACTION: Mania    Family History  Problem Relation Age of Onset  . Seizures Other   . Thyroid disease Mother   . Cancer Father 26    esoph ca  . Esophageal cancer Father   . Seizures Maternal Grandmother   . Thyroid disease Maternal Grandmother   . Hyperlipidemia Paternal Grandmother   . Diabetes Paternal Grandmother   . Breast cancer Paternal Grandmother   . Cancer Paternal Grandmother     Bladder and colon cancer  . Colon cancer Paternal Grandmother   . Rectal cancer Neg Hx   . Stomach cancer Neg Hx     History   Social History  . Marital Status: Married    Spouse Name: N/A    Number of Children: N/A  . Years of Education: N/A   Occupational History  . WORKS [employer]     [employer]  Social History Main Topics  . Smoking status: Former Games developer  . Smokeless tobacco: Never Used  . Alcohol Use: Yes     Comment: Rare  . Drug Use: No  . Sexually Active: Not Currently    Birth Control/ Protection: Pill   Other Topics Concern  . Not on file   Social History Narrative   Regular exercise- yes     Constitutional: Denies fever, malaise, fatigue, headache or abrupt weight changes.  HEENT: Denies eye pain, eye redness, ear pain, ringing in the ears, wax buildup, runny nose, nasal congestion, bloody nose, or sore throat. Respiratory: Denies difficulty breathing, shortness of breath, cough or sputum production.   Skin: Pt report s hives. Denies redness, lesions or ulcercations.    No other specific complaints in a complete review of systems (except as listed in HPI above).     Objective:   Physical Exam  BP 124/82  Pulse 93  Temp(Src) 98.2  F (36.8 C) (Oral)  SpO2 97%  LMP 07/13/2012 Wt Readings from Last 3 Encounters:  06/04/12 181 lb (82.101 kg)  04/29/12 175 lb (79.379 kg)  12/27/11 170 lb (77.111 kg)    General: Appears her stated age, well developed, well nourished in NAD. Skin: Warm, dry and intact. No lesions or ulcerations noted. Hives noted on face and arms. HEENT: Head: normal shape and size; Eyes: sclera white, no icterus, conjunctiva pink, PERRLA and EOMs intact; Ears: Tm's gray and intact, normal light reflex; Nose: mucosa pink and moist, septum midline; Throat/Mouth: Teeth present, mucosa pink and moist, no exudate, lesions or ulcerations noted.  Cardiovascular: Normal rate and rhythm. S1,S2 noted.  No murmur, rubs or gallops noted. No JVD or BLE edema. No carotid bruits noted. Pulmonary/Chest: Normal effort and positive vesicular breath sounds. No respiratory distress. No wheezes, rales or ronchi noted.      BMET    Component Value Date/Time   NA 138 04/29/2012 1417   K 3.6 04/29/2012 1417   CL 102 04/29/2012 1417   CO2 20 04/29/2012 1417   GLUCOSE 58* 04/29/2012 1417   BUN 10 04/29/2012 1417   CREATININE 0.83 04/29/2012 1417   CREATININE 0.86 03/24/2010 1317   CALCIUM 9.1 04/29/2012 1417   GFRNONAA >60 10/15/2009 0512   GFRAA  Value: >60        The eGFR has been calculated using the MDRD equation. This calculation has not been validated in all clinical situations. eGFR's persistently <60 mL/min signify possible Chronic Kidney Disease. 10/15/2009 0512    Lipid Panel  No results found for this basename: chol, trig, hdl, cholhdl, vldl, ldlcalc    CBC    Component Value Date/Time   WBC 7.4 04/29/2012 1417   RBC 4.56 04/29/2012 1417   HGB 13.6 04/29/2012 1417   HCT 40.3 04/29/2012 1417   PLT 291 04/29/2012 1417   MCV 88.4 04/29/2012 1417   MCH 29.8 04/29/2012 1417   MCHC 33.7 04/29/2012 1417   RDW 12.9 04/29/2012 1417   LYMPHSABS 2.6 04/29/2012 1417   MONOABS 0.4 04/29/2012 1417   EOSABS 0.2 04/29/2012 1417    BASOSABS 0.1 04/29/2012 1417    Hgb A1C No results found for this basename: HGBA1C         Assessment & Plan:   Urticaria secondary to stress:  Continue hydroxyzine 120 mg Depo IM today  RTC as needed or if hives persist or worsen

## 2012-08-01 NOTE — Patient Instructions (Signed)

## 2012-08-23 ENCOUNTER — Encounter: Payer: Self-pay | Admitting: Internal Medicine

## 2012-08-23 ENCOUNTER — Ambulatory Visit (INDEPENDENT_AMBULATORY_CARE_PROVIDER_SITE_OTHER): Payer: 59 | Admitting: Internal Medicine

## 2012-08-23 VITALS — BP 130/82 | HR 80 | Temp 98.4°F | Resp 16 | Wt 179.0 lb

## 2012-08-23 DIAGNOSIS — E538 Deficiency of other specified B group vitamins: Secondary | ICD-10-CM

## 2012-08-23 DIAGNOSIS — L509 Urticaria, unspecified: Secondary | ICD-10-CM

## 2012-08-23 MED ORDER — CYCLOBENZAPRINE HCL 5 MG PO TABS: 5.0000 mg | ORAL_TABLET | Freq: Three times a day (TID) | ORAL | Status: DC | PRN

## 2012-08-23 MED ORDER — CLONAZEPAM 1 MG PO TBDP: 1.0000 mg | ORAL_TABLET | Freq: Three times a day (TID) | ORAL | Status: DC | PRN

## 2012-08-23 MED ORDER — LORATADINE 10 MG PO TABS: 10.0000 mg | ORAL_TABLET | Freq: Every day | ORAL | Status: DC

## 2012-08-23 MED ORDER — DULOXETINE HCL 60 MG PO CPEP: 60.0000 mg | ORAL_CAPSULE | Freq: Every day | ORAL | Status: DC

## 2012-08-23 MED ORDER — OMEPRAZOLE 20 MG PO CPDR: 40.0000 mg | DELAYED_RELEASE_CAPSULE | Freq: Every day | ORAL | Status: DC

## 2012-08-23 NOTE — Patient Instructions (Signed)
Gluten free trial (no wheat products) x4-6 weeks. OK to use Gluten-free bread and pasta. Milk free trial (no milk, ice cream and yogurt) x4 weeks. OK to use almond or soy milk. 

## 2012-08-23 NOTE — Progress Notes (Signed)
Subjective:  F/u hives - worse C/o FMS - better C/o voice being lower or would cut out F/u sweats, esp night sweats - worse x 2 mo F/u increased appetite and wt gain x few wks  Back Pain This is a chronic problem. The current episode started more than 1 month ago. The problem occurs intermittently. The problem has been gradually improving since onset. The pain is present in the lumbar spine. The quality of the pain is described as aching. The pain is at a severity of 4/10. The pain is moderate. The symptoms are aggravated by bending. Stiffness is present in the morning. Associated symptoms include headaches. Pertinent negatives include no chest pain, fever, numbness or weakness. Treatments tried: flexeril helps a little; hydrocodone helps better. The treatment provided moderate relief.  Headache  This is a recurrent problem. The current episode started more than 1 year ago. The problem has been gradually improving. The pain is mild. Associated symptoms include back pain. Pertinent negatives include no coughing, fever, numbness or weakness. The treatment provided moderate relief.    F/u of complains of overwhelming exhaustion and hypersomnia - better on Cymbalta Fatigue despite sleep - better +snoring most nights - denies OSA Myalgias and stiffness without swelling in joints - affects back and neck - better, but not by much (flexeril helped, had to take 2 Norco a day lately) Takes less klonopin 1-3x/day for tremor symptoms - no other med changes, pain meds - not every day Denies overlap with chronic biploar symptoms  Wt Readings from Last 3 Encounters:  08/23/12 179 lb (81.194 kg)  06/04/12 181 lb (82.101 kg)  04/29/12 175 lb (79.379 kg)   BP Readings from Last 3 Encounters:  08/23/12 130/82  08/01/12 124/82  06/04/12 110/74      Past Medical History  Diagnosis Date  . Blood type, Rh negative   . IBS (irritable bowel syndrome)   . Acid reflux   . Bipolar 1 disorder   . PIH  (pregnancy induced hypertension), antepartum     icu after delivery and currently hypertensive  . B12 deficiency   . Spastic colon   . Hives   . Essential tremor   . Allergy   . Anemia   . Anxiety   . Depression   . Hyperlipidemia   . Neuromuscular disorder     fibromylagia    Review of Systems  Constitutional: Positive for fatigue. Negative for fever and unexpected weight change.  Respiratory: Negative for cough and shortness of breath.   Cardiovascular: Negative for chest pain and palpitations.  Musculoskeletal: Positive for myalgias and back pain. Negative for joint swelling. Arthralgias: better.  Neurological: Positive for headaches. Negative for weakness and numbness.       Objective:   Physical Exam  Constitutional: She appears well-developed. No distress.  HENT:  Head: Normocephalic.  Right Ear: External ear normal.  Left Ear: External ear normal.  Nose: Nose normal.  Mouth/Throat: Oropharynx is clear and moist.  Eyes: Conjunctivae are normal. Pupils are equal, round, and reactive to light. Right eye exhibits no discharge. Left eye exhibits no discharge.  Neck: Normal range of motion. Neck supple. No JVD present. No tracheal deviation present. No thyromegaly present.  Cardiovascular: Normal rate, regular rhythm and normal heart sounds.   Pulmonary/Chest: No stridor. No respiratory distress. She has no wheezes.  Abdominal: Soft. Bowel sounds are normal. She exhibits no distension and no mass. There is no tenderness. There is no rebound and no guarding.  Musculoskeletal:  She exhibits tenderness. She exhibits no edema.  Lymphadenopathy:    She has no cervical adenopathy.  Neurological: She displays normal reflexes. No cranial nerve deficit. She exhibits normal muscle tone. Coordination normal.  Skin: No rash noted. No erythema.  Psychiatric: She has a normal mood and affect. Her behavior is normal. Judgment and thought content normal.   BP 130/82  Pulse 80   Temp(Src) 98.4 F (36.9 C) (Oral)  Resp 16  Wt 179 lb (81.194 kg)  BMI 28.91 kg/m2  LMP 07/13/2012     Lab Results  Component Value Date   WBC 7.4 04/29/2012   HGB 13.6 04/29/2012   HCT 40.3 04/29/2012   PLT 291 04/29/2012   GLUCOSE 58* 04/29/2012   ALT 16 04/29/2012   AST 17 04/29/2012   NA 138 04/29/2012   K 3.6 04/29/2012   CL 102 04/29/2012   CREATININE 0.83 04/29/2012   BUN 10 04/29/2012   CO2 20 04/29/2012   TSH 0.622 04/29/2012       Assessment & Plan:

## 2012-08-25 NOTE — Assessment & Plan Note (Signed)
Better  

## 2012-08-25 NOTE — Assessment & Plan Note (Signed)
Continue with current prescription therapy as reflected on the Med list.  

## 2012-09-20 ENCOUNTER — Ambulatory Visit (INDEPENDENT_AMBULATORY_CARE_PROVIDER_SITE_OTHER): Payer: 59 | Admitting: Gynecology

## 2012-09-20 ENCOUNTER — Encounter: Payer: Self-pay | Admitting: Gynecology

## 2012-09-20 ENCOUNTER — Other Ambulatory Visit: Payer: Self-pay | Admitting: Gynecology

## 2012-09-20 VITALS — BP 120/78 | Ht 68.0 in | Wt 173.0 lb

## 2012-09-20 DIAGNOSIS — Z13220 Encounter for screening for lipoid disorders: Secondary | ICD-10-CM

## 2012-09-20 DIAGNOSIS — Z01419 Encounter for gynecological examination (general) (routine) without abnormal findings: Secondary | ICD-10-CM

## 2012-09-20 LAB — COMPREHENSIVE METABOLIC PANEL
Albumin: 4.1 g/dL (ref 3.5–5.2)
Alkaline Phosphatase: 51 U/L (ref 39–117)
BUN: 7 mg/dL (ref 6–23)
Calcium: 9 mg/dL (ref 8.4–10.5)
Chloride: 102 mEq/L (ref 96–112)
Glucose, Bld: 79 mg/dL (ref 70–99)
Potassium: 4 mEq/L (ref 3.5–5.3)

## 2012-09-20 LAB — CBC WITH DIFFERENTIAL/PLATELET
Basophils Relative: 0 % (ref 0–1)
HCT: 39.9 % (ref 36.0–46.0)
Hemoglobin: 13.7 g/dL (ref 12.0–15.0)
MCHC: 34.3 g/dL (ref 30.0–36.0)
Monocytes Absolute: 0.5 10*3/uL (ref 0.1–1.0)
Monocytes Relative: 8 % (ref 3–12)
Neutro Abs: 4.1 10*3/uL (ref 1.7–7.7)

## 2012-09-20 LAB — LIPID PANEL
Cholesterol: 253 mg/dL — ABNORMAL HIGH (ref 0–200)
HDL: 33 mg/dL — ABNORMAL LOW (ref >39)
Triglycerides: 187 mg/dL — ABNORMAL HIGH (ref <150)

## 2012-09-20 MED ORDER — NORETHIN ACE-ETH ESTRAD-FE 1-20 MG-MCG PO TABS: 1.0000 | ORAL_TABLET | Freq: Every day | ORAL | Status: DC

## 2012-09-20 NOTE — Patient Instructions (Signed)
Followup for IUD if you choose. Followup for annual exam in one year.

## 2012-09-20 NOTE — Addendum Note (Signed)
Addended by: Dayna Barker on: 09/20/2012 11:23 AM   Modules accepted: Orders

## 2012-09-20 NOTE — Progress Notes (Signed)
Christine Duncan 04/06/1980 [id number]        32 y.o.  [id number] for annual exam.  Doing well without complaints.  Past medical history,surgical history, medications, allergies, family history and social history were all reviewed and documented in the EPIC chart.  ROS:  Performed and pertinent positives and negatives are included in the history, assessment and plan .  Exam: [name] assistant Filed Vitals:   09/20/12 1037  BP: 120/78  Height: 5\' 8"  (1.727 m)  Weight: 173 lb (78.472 kg)   General appearance  Normal Skin grossly normal Head/Neck normal with no cervical or supraclavicular adenopathy thyroid normal Lungs  clear Cardiac RR, without RMG Abdominal  soft, nontender, without masses, organomegaly or hernia Breasts  examined lying and sitting without masses, retractions, discharge or axillary adenopathy. Pelvic  Ext/BUS/vagina  normal  Cervix  normal Pap/HPV  Uterus  anteverted, normal size, shape and contour, midline and mobile nontender   Adnexa  Without masses or tenderness    Anus and perineum  normal   Rectovaginal  normal sphincter tone without palpated masses or tenderness.    Assessment/Plan:  32 y.o. [id number] female for annual exam.   1. Contraception. Patient using low-dose oral contraceptives continuously with no menses and no irregular bleeding. I again discussed alternatives to include Mirena IUD. Risks of pills reviewed to include stroke heart attack DVT. Had smoked previously but stopped a number of years ago. Possible increased risk associated with this also discussed. Patient is going to think about the IUD and literature was given. I refilled her oral contraceptives times a year. 2. Pap smear 2013. 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 Followup Pap smear x2 showed ASCUS with negative high-risk HPV. Pap/HPV done today. If any abnormality then plan colposcopy. If normal then plan repeat in 1 year. 3. Breast health. SBE monthly reviewed. 4. Health  maintenance. Baseline CBC comprehensive metabolic panel lipid profile urinalysis ordered. Followup with Pap smear results, otherwise annually. Sooner if she wants to proceed with IUD.  Note: This document was prepared with digital dictation and possible smart phrase technology. Any transcriptional errors that result from this process are unintentional.   Dara Lords MD, 11:06 AM 09/20/2012

## 2012-09-24 ENCOUNTER — Other Ambulatory Visit: Payer: Self-pay | Admitting: Gynecology

## 2012-09-24 ENCOUNTER — Telehealth: Payer: Self-pay

## 2012-09-24 DIAGNOSIS — Z8349 Family history of other endocrine, nutritional and metabolic diseases: Secondary | ICD-10-CM

## 2012-09-24 DIAGNOSIS — R7401 Elevation of levels of liver transaminase levels: Secondary | ICD-10-CM

## 2012-09-24 DIAGNOSIS — E7800 Pure hypercholesterolemia, unspecified: Secondary | ICD-10-CM

## 2012-09-24 LAB — PAP IG AND HPV HIGH-RISK

## 2012-09-24 NOTE — Telephone Encounter (Signed)
Message copied by Keenan Bachelor on Tue Sep 24, 2012  3:43 PM ------      Message from: Dara Lords      Created: Sat Sep 21, 2012  6:28 PM       Tell patient:      #1 One liver enzyme elevated.  Recommend repeating liver panel      #2  Cholesterol, LDL, Trig all elevated.  Recheck fasting lipid profile.  If remains elevated will need to see primary MD to treat. ------

## 2012-09-24 NOTE — Telephone Encounter (Signed)
Order added.

## 2012-09-24 NOTE — Telephone Encounter (Signed)
Ok for TSH with other blood work

## 2012-09-24 NOTE — Telephone Encounter (Signed)
Patient asked if when she comes back from below if you could "check her thyroid".  She said it runs in her family and her mom is always asking her if she had it checked.

## 2012-09-30 DIAGNOSIS — Z0279 Encounter for issue of other medical certificate: Secondary | ICD-10-CM

## 2012-11-07 ENCOUNTER — Ambulatory Visit (INDEPENDENT_AMBULATORY_CARE_PROVIDER_SITE_OTHER): Payer: 59 | Admitting: Internal Medicine

## 2012-11-07 ENCOUNTER — Encounter: Payer: Self-pay | Admitting: Internal Medicine

## 2012-11-07 ENCOUNTER — Other Ambulatory Visit: Payer: 59

## 2012-11-07 VITALS — BP 130/80 | HR 72 | Temp 98.8°F | Resp 16 | Wt 165.0 lb

## 2012-11-07 DIAGNOSIS — R7989 Other specified abnormal findings of blood chemistry: Secondary | ICD-10-CM

## 2012-11-07 DIAGNOSIS — J069 Acute upper respiratory infection, unspecified: Secondary | ICD-10-CM

## 2012-11-07 DIAGNOSIS — E538 Deficiency of other specified B group vitamins: Secondary | ICD-10-CM

## 2012-11-07 DIAGNOSIS — L509 Urticaria, unspecified: Secondary | ICD-10-CM

## 2012-11-07 DIAGNOSIS — E785 Hyperlipidemia, unspecified: Secondary | ICD-10-CM

## 2012-11-07 MED ORDER — AZITHROMYCIN 250 MG PO TABS: ORAL_TABLET | ORAL | Status: DC

## 2012-11-07 MED ORDER — FLUCONAZOLE 150 MG PO TABS: 150.0000 mg | ORAL_TABLET | Freq: Once | ORAL | Status: DC

## 2012-11-07 MED ORDER — HYDROXYZINE HCL 25 MG PO TABS: 25.0000 mg | ORAL_TABLET | Freq: Three times a day (TID) | ORAL | Status: DC | PRN

## 2012-11-07 NOTE — Progress Notes (Signed)
Subjective:  F/u hives - not better C/o FMS - better C/o voice being lower or would cut out F/u sweats, esp night sweats - worse x 2 mo F/u increased appetite and wt gain x few wks  Abd pain is better on gluten free C/o URI sx's   Back Pain This is a chronic problem. The current episode started more than 1 month ago. The problem occurs intermittently. The problem has been gradually improving since onset. The pain is present in the lumbar spine. The quality of the pain is described as aching. The pain is at a severity of 4/10. The pain is moderate. The symptoms are aggravated by bending. Stiffness is present in the morning. Associated symptoms include headaches. Pertinent negatives include no chest pain, fever, numbness or weakness. Treatments tried: flexeril helps a little; hydrocodone helps better. The treatment provided moderate relief.  Headache  This is a recurrent problem. The current episode started more than 1 year ago. The problem has been gradually improving. The pain is mild. Associated symptoms include back pain. Pertinent negatives include no coughing, fever, numbness or weakness. The treatment provided moderate relief.    F/u of complains of overwhelming exhaustion and hypersomnia - better on Cymbalta Fatigue despite sleep - better +snoring most nights - denies OSA Myalgias and stiffness without swelling in joints - affects back and neck - better, but not by much (flexeril helped, had to take 2 Norco a day lately) Takes less klonopin 1-3x/day for tremor symptoms - no other med changes, pain meds - not every day Denies overlap with chronic biploar symptoms  Wt Readings from Last 3 Encounters:  11/07/12 165 lb (74.844 kg)  09/20/12 173 lb (78.472 kg)  08/23/12 179 lb (81.194 kg)   BP Readings from Last 3 Encounters:  11/07/12 130/80  09/20/12 120/78  08/23/12 130/82      Past Medical History  Diagnosis Date  . Blood type, Rh negative   . IBS (irritable bowel  syndrome)   . Acid reflux   . Bipolar 1 disorder   . PIH (pregnancy induced hypertension), antepartum     icu after delivery and currently hypertensive  . B12 deficiency   . Spastic colon   . Hives   . Essential tremor   . Allergy   . Anemia   . Anxiety   . Depression   . Hyperlipidemia   . Neuromuscular disorder     fibromylagia  . Fibromyalgia   . Cervical dysplasia     Review of Systems  Constitutional: Positive for fatigue. Negative for fever and unexpected weight change.  Respiratory: Negative for cough and shortness of breath.   Cardiovascular: Negative for chest pain and palpitations.  Musculoskeletal: Positive for back pain and myalgias. Negative for joint swelling. Arthralgias: better.  Neurological: Positive for headaches. Negative for weakness and numbness.       Objective:   Physical Exam  Constitutional: She appears well-developed. No distress.  HENT:  Head: Normocephalic.  Right Ear: External ear normal.  Left Ear: External ear normal.  Nose: Nose normal.  Mouth/Throat: Oropharynx is clear and moist.  Eyes: Conjunctivae are normal. Pupils are equal, round, and reactive to light. Right eye exhibits no discharge. Left eye exhibits no discharge.  Neck: Normal range of motion. Neck supple. No JVD present. No tracheal deviation present. No thyromegaly present.  Cardiovascular: Normal rate, regular rhythm and normal heart sounds.   Pulmonary/Chest: No stridor. No respiratory distress. She has no wheezes.  Abdominal: Soft. Bowel sounds  are normal. She exhibits no distension and no mass. There is no tenderness. There is no rebound and no guarding.  Musculoskeletal: She exhibits tenderness. She exhibits no edema.  Lymphadenopathy:    She has no cervical adenopathy.  Neurological: She displays normal reflexes. No cranial nerve deficit. She exhibits normal muscle tone. Coordination normal.  Skin: No rash noted. No erythema.  Psychiatric: She has a normal mood and  affect. Her behavior is normal. Judgment and thought content normal.  eryth throat  BP 130/80  Pulse 72  Temp(Src) 98.8 F (37.1 C) (Oral)  Resp 16  Wt 165 lb (74.844 kg)  BMI 25.09 kg/m2     Lab Results  Component Value Date   WBC 7.1 09/20/2012   HGB 13.7 09/20/2012   HCT 39.9 09/20/2012   PLT 332 09/20/2012   GLUCOSE 79 09/20/2012   CHOL 253* 09/20/2012   TRIG 187* 09/20/2012   HDL 33* 09/20/2012   LDLCALC 183* 09/20/2012   ALT 83* 09/20/2012   AST 32 09/20/2012   NA 137 09/20/2012   K 4.0 09/20/2012   CL 102 09/20/2012   CREATININE 0.76 09/20/2012   BUN 7 09/20/2012   CO2 27 09/20/2012   TSH 0.622 04/29/2012       Assessment & Plan:

## 2012-11-07 NOTE — Assessment & Plan Note (Signed)
z pac 

## 2012-11-07 NOTE — Assessment & Plan Note (Signed)
Continue with current prescription therapy as reflected on the Med list.  

## 2012-11-07 NOTE — Patient Instructions (Signed)

## 2012-11-07 NOTE — Assessment & Plan Note (Signed)
Try milk free

## 2012-11-08 LAB — HEPATIC FUNCTION PANEL
ALT: 38 U/L — ABNORMAL HIGH (ref 0–35)
Bilirubin, Direct: 0.1 mg/dL (ref 0.0–0.3)
Indirect Bilirubin: 0.3 mg/dL (ref 0.0–0.9)
Total Protein: 7.7 g/dL (ref 6.0–8.3)

## 2012-11-08 LAB — LIPID PANEL
Cholesterol: 281 mg/dL — ABNORMAL HIGH (ref 0–200)
LDL Cholesterol: 194 mg/dL — ABNORMAL HIGH (ref 0–99)
VLDL: 53 mg/dL — ABNORMAL HIGH (ref 0–40)

## 2012-11-14 ENCOUNTER — Encounter: Payer: Self-pay | Admitting: Internal Medicine

## 2012-11-20 ENCOUNTER — Encounter: Payer: Self-pay | Admitting: Internal Medicine

## 2012-11-21 ENCOUNTER — Other Ambulatory Visit: Payer: Self-pay

## 2012-11-26 ENCOUNTER — Ambulatory Visit (HOSPITAL_COMMUNITY)
Admission: RE | Admit: 2012-11-26 | Discharge: 2012-11-26 | Disposition: A | Discharge status: Home / Self Care | Payer: 59 | Attending: Psychiatry | Admitting: Psychiatry

## 2012-11-26 NOTE — BH Assessment (Signed)
Assessment Note  Christine Duncan is an 32 y.o. female. Pt presents voluntarily as walk in to [facility] accompanied by friend, Candise Bowens. Pt reports she began having manic symptoms 11/25/12. Pt sts last manic episode was 5 yrs ago when she was pt of Dr. Evelene Croon. Pt denies SI and HI. She denies [facility] and no delusions noted. Pt is cooperative and insightful. She states she was dx with bipolar d/o several years ago. Pt has 34 yo and 34yo and she lives w/ her two children and husband. Pt sts current meds are prescribed by her PCP Plotnikov and pt says she takes her meds as directed. Pt sts her typical pattern of mania occurs for two to three days followed by approx. one week of severe depression in which she can't get out of bed. Pt works full-time and is under stress. Pt reports she has essential tremors which seem to have worsened over past few days. Pt has visible slight tremor of her head. Pt returned two weeks ago from trip to [facility] w/ her family and her father who has advanced stage cancer. Pt sts she has experienced racing thoughts since yesterday am. She also says since yesterday am she has begun several cleaning projects in her house but says she doesn't complete one task before going on to the next one. Pt describes her house as "atrocious" b/c of her unfinished cleaning and organizational tasks in the house. She says that she also spent $150 on extraneous items when she really couldn't afford to go shopping. Pt sts her overspending is a symptom of her previous manic episodes. Pt sts poor concentration and impaired memory since 11/25/12. She reports dx of fibromyalgia. Pt sts she was able to sleep last night b/c she took 3 Klonopin so she could go to sleep. Pt has no hx of suicide attempts and no hx of self harm. Pt doesn't want inpatient treatment as she has full time job, two children, and husband not able to drive at this time. Discussed patient with Verne Spurr PA-C who is in agreement with writer that pt  doesn't need inpatient treatment as pt doesn't meet inpatient criteria. Writer called Dr. Carie Caddy office but Raynelle Fanning informed writer that Dr. Carie Caddy earliest available appointment is mid February. Writer called [facility] and scheduled appt  with Dr. Daleen Bo on 12/9 at 11 am and pt is to arrive at 10:30 am for paperwork. Verne Spurr PA-C called pt's PCP Plotnikov. Pt has appt with Plotnikov at 4:15 pm tomorrow 11/27/12. Pt declined MSE and signed MSE decline form.   Axis I: Bipolar I Disorder Axis II: Deferred Axis III:  Past Medical History  Diagnosis Date  . Blood type, Rh negative   . IBS (irritable bowel syndrome)   . Acid reflux   . Bipolar 1 disorder   . PIH (pregnancy induced hypertension), antepartum     icu after delivery and currently hypertensive  . B12 deficiency   . Spastic colon   . Hives   . Essential tremor   . Allergy   . Anemia   . Anxiety   . Depression   . Hyperlipidemia   . Neuromuscular disorder     fibromylagia  . Fibromyalgia   . Cervical dysplasia    Axis IV: occupational problems Axis V: 41-50 serious symptoms  Past Medical History:  Past Medical History  Diagnosis Date  . Blood type, Rh negative   . IBS (irritable bowel syndrome)   . Acid reflux   .  Bipolar 1 disorder   . PIH (pregnancy induced hypertension), antepartum     icu after delivery and currently hypertensive  . B12 deficiency   . Spastic colon   . Hives   . Essential tremor   . Allergy   . Anemia   . Anxiety   . Depression   . Hyperlipidemia   . Neuromuscular disorder     fibromylagia  . Fibromyalgia   . Cervical dysplasia     Past Surgical History  Procedure Laterality Date  . Dilation and curettage of uterus  2005  . Cesarean section  2006  . Cesarean section  2011  . 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 Colposcopy      Family History:  Family History  Problem Relation Age of Onset  . Thyroid disease Mother   . Cancer Father 69    esoph ca  .  Esophageal cancer Father   . Seizures Maternal Grandmother   . Thyroid disease Maternal Grandmother   . Hyperlipidemia Paternal Grandmother   . Diabetes Paternal Grandmother   . Breast cancer Paternal Grandmother 56  . Cancer Paternal Grandmother     Bladder and colon cancer  . Colon cancer Paternal Grandmother   . Rectal cancer Neg Hx   . Stomach cancer Neg Hx     Social History:  reports that she has quit smoking. She has never used smokeless tobacco. She reports that she drinks alcohol. She reports that she does not use illicit drugs.  Additional Social History:  Alcohol / Drug Use Pain Medications: see PTA meds list - pt denies abuse Prescriptions: see PTA meds list - pt denies abuse Over the Counter: see PTA meds list - pt denies abuse History of alcohol / drug use?: No history of alcohol / drug abuse  CIWA:   COWS:    Allergies:  Allergies  Allergen Reactions  . Carbidopa-Levodopa     REACTION: nausea  . Sertraline Hcl     REACTION: Mania    Home Medications:  (Not in a hospital admission)  OB/GYN Status:  No LMP recorded. Patient is not currently having periods (Reason: Oral contraceptives).  General Assessment Data Location of Assessment: [facility] Is this a Tele or Face-to-Face Assessment?: Face-to-Face Is this an Initial Assessment or a Re-assessment for this encounter?: Initial Assessment Living Arrangements: Spouse/significant other;Children Can pt return to current living arrangement?: Yes Admission Status: Voluntary Is patient capable of signing voluntary admission?: Yes Transfer from: Home Referral Source: Self/Family/Friend  Medical Screening Exam [facility] Walk-in ONLY) Medical Exam completed: No Reason for MSE not completed: Patient Refused (pt signed declined MSE form)  [facility] Crisis Care Plan Living Arrangements: Spouse/significant other;Children  Education Status Is patient currently in school?: No Highest grade of school patient  has completed: 12  Risk to self Suicidal Ideation: No Suicidal Intent: No Is patient at risk for suicide?: No Suicidal Plan?: No Access to Means: No What has been your use of drugs/alcohol within the last 12 months?: none Previous Attempts/Gestures: No How many times?: 0 Other Self Harm Risks: none Triggers for Past Attempts:  (n/a) Intentional Self Injurious Behavior: None Family Suicide History: No (there is fam hx of MI and substance abuse) Recent stressful life event(s): Other (Comment) (small children, husband has DUI so can't drive) Persecutory voices/beliefs?: No Depression: No Depression Symptoms: Isolating Substance abuse history and/or treatment for substance abuse?: No Suicide prevention information given to non-admitted patients: Not applicable  Risk to  Others Homicidal Ideation: No Thoughts of Harm to Others: No Current Homicidal Intent: No Current Homicidal Plan: No Access to Homicidal Means: No Identified Victim: none History of harm to others?: No Assessment of Violence: None Noted Violent Behavior Description: pt calm and cooperative Does patient have access to weapons?: No Criminal Charges Pending?: No Does patient have a court date: No  Psychosis Hallucinations: None noted Delusions: Erotomanic  Mental Status Report Appear/Hygiene: Other (Comment) (appropriate) Eye Contact: Good Motor Activity: Freedom of movement;Tremors Speech: Logical/coherent Level of Consciousness: Alert Mood: Anxious Affect: Appropriate to circumstance;Anxious Anxiety Level: Moderate Thought Processes: Relevant;Coherent Judgement: Unimpaired Orientation: Person;Place;Time;Situation Obsessive Compulsive Thoughts/Behaviors: None  Cognitive Functioning Concentration: Decreased Memory: Remote Intact;Recent Impaired IQ: Average Insight: Good Impulse Control: Good Appetite: Fair Sleep: No Change Total Hours of Sleep: 6 Vegetative Symptoms: None  ADLScreening [facility]  Assessment Services) Patient's cognitive ability adequate to safely complete daily activities?: Yes Patient able to express need for assistance with ADLs?: Yes Independently performs ADLs?: Yes (appropriate for developmental age)  Prior Inpatient Therapy Prior Inpatient Therapy: No Prior Therapy Dates: na Prior Therapy Facilty/Provider(s): na Reason for Treatment: na  Prior Outpatient Therapy Prior Outpatient Therapy: Yes Prior Therapy Dates: five years ago Prior Therapy Facilty/Provider(s): Dr. Evelene Croon Reason for Treatment: bipolar disorder, med management  ADL Screening (condition at time of admission) Patient's cognitive ability adequate to safely complete daily activities?: Yes Is the patient deaf or have difficulty hearing?: No Does the patient have difficulty seeing, even when wearing glasses/contacts?: No Does the patient have difficulty concentrating, remembering, or making decisions?: No Patient able to express need for assistance with ADLs?: Yes Does the patient have difficulty dressing or bathing?: No Independently performs ADLs?: Yes (appropriate for developmental age) Does the patient have difficulty walking or climbing stairs?: No Weakness of Legs: None Weakness of Arms/Hands: None       Abuse/Neglect Assessment (Assessment to be complete while patient is alone) Physical Abuse: Denies Verbal Abuse: Yes, past (Comment) Sexual Abuse: Denies Exploitation of patient/patient's resources: Denies Self-Neglect: Denies     Merchant navy officer (For Healthcare) Advance Directive: Patient does not have advance directive;Patient would not like information    Additional Information 1:1 In Past 12 Months?: No CIRT Risk: No Elopement Risk: No Does patient have medical clearance?: No     Disposition:  Disposition Initial Assessment Completed for this Encounter: Yes  On Site Evaluation by:   Reviewed with Physician:    Donnamarie Rossetti P 11/26/2012 2:53 PM

## 2012-11-27 ENCOUNTER — Ambulatory Visit (INDEPENDENT_AMBULATORY_CARE_PROVIDER_SITE_OTHER): Payer: 59 | Admitting: Internal Medicine

## 2012-11-27 ENCOUNTER — Encounter: Payer: Self-pay | Admitting: Internal Medicine

## 2012-11-27 VITALS — BP 130/88 | HR 76 | Temp 98.1°F | Resp 16 | Wt 161.0 lb

## 2012-11-27 DIAGNOSIS — G4700 Insomnia, unspecified: Secondary | ICD-10-CM

## 2012-11-27 DIAGNOSIS — F319 Bipolar disorder, unspecified: Secondary | ICD-10-CM

## 2012-11-27 DIAGNOSIS — R259 Unspecified abnormal involuntary movements: Secondary | ICD-10-CM

## 2012-11-27 DIAGNOSIS — E538 Deficiency of other specified B group vitamins: Secondary | ICD-10-CM

## 2012-11-27 DIAGNOSIS — F3110 Bipolar disorder, current episode manic without psychotic features, unspecified: Secondary | ICD-10-CM

## 2012-11-27 DIAGNOSIS — L509 Urticaria, unspecified: Secondary | ICD-10-CM

## 2012-11-27 MED ORDER — CLONAZEPAM 1 MG PO TBDP: 1.0000 mg | ORAL_TABLET | Freq: Three times a day (TID) | ORAL | Status: DC | PRN

## 2012-11-27 MED ORDER — LITHIUM CARBONATE 300 MG PO TABS: 300.0000 mg | ORAL_TABLET | Freq: Two times a day (BID) | ORAL | Status: DC

## 2012-11-27 MED ORDER — DIVALPROEX SODIUM 250 MG PO DR TAB: 250.0000 mg | DELAYED_RELEASE_TABLET | Freq: Two times a day (BID) | ORAL | Status: DC

## 2012-11-27 MED ORDER — DEPLIN 15 15-90.314 MG PO CAPS: 1.0000 | ORAL_CAPSULE | Freq: Every morning | ORAL | Status: DC

## 2012-11-27 NOTE — Assessment & Plan Note (Signed)
Continue with current prn prescription therapy as reflected on the Med list. Exclusion diet discussed

## 2012-11-27 NOTE — Assessment & Plan Note (Signed)
Continue with current prescription therapy as reflected on the Med list.  

## 2012-11-27 NOTE — Patient Instructions (Signed)
Pt was taken out of work starting 11/26/12

## 2012-11-27 NOTE — Assessment & Plan Note (Signed)
Start Deplin Cont Cymbalta for now

## 2012-11-27 NOTE — Progress Notes (Signed)
Subjective:  C/o going into a manic stage 2-3 days ago. She has been hyperactive, sleepless, taking up on different projects etc.  F/u hives -  better F/u FMS - better on Cymbalta  F/u sweats, esp night sweats   Abd pain is better on gluten free    HPI  F/u of complains of overwhelming exhaustion and hypersomnia - better on Cymbalta Fatigue despite sleep - better +snoring most nights - denies OSA Myalgias and stiffness without swelling in joints - affects back and neck - better, but not by much (flexeril helped, had to take 2 Norco a day lately) Takes less klonopin 1-3x/day for tremor symptoms - no other med changes, pain meds - not every day Denies overlap with chronic biploar symptoms  Wt Readings from Last 3 Encounters:  11/27/12 161 lb (73.029 kg)  11/07/12 165 lb (74.844 kg)  09/20/12 173 lb (78.472 kg)   BP Readings from Last 3 Encounters:  11/27/12 130/88  11/07/12 130/80  09/20/12 120/78      Past Medical History  Diagnosis Date  . Blood type, Rh negative   . IBS (irritable bowel syndrome)   . Acid reflux   . Bipolar 1 disorder   . PIH (pregnancy induced hypertension), antepartum     icu after delivery and currently hypertensive  . B12 deficiency   . Spastic colon   . Hives   . Essential tremor   . Allergy   . Anemia   . Anxiety   . Depression   . Hyperlipidemia   . Neuromuscular disorder     fibromylagia  . Fibromyalgia   . Cervical dysplasia     Review of Systems  Constitutional: Positive for fatigue. Negative for unexpected weight change.  HENT: Negative for congestion, dental problem, hearing loss and sore throat.   Respiratory: Negative for shortness of breath.   Cardiovascular: Negative for palpitations and leg swelling.  Gastrointestinal: Negative for nausea, abdominal pain and diarrhea.  Musculoskeletal: Positive for myalgias. Negative for joint swelling. Arthralgias: better.  Neurological: Positive for tremors and speech  difficulty. Negative for facial asymmetry, weakness, light-headedness and headaches.  Hematological: Negative for adenopathy.  Psychiatric/Behavioral: Positive for sleep disturbance and agitation. Negative for suicidal ideas, hallucinations, confusion, dysphoric mood and decreased concentration. The patient is nervous/anxious and is hyperactive.        Objective:   Physical Exam  Constitutional: She is oriented to person, place, and time. She appears well-developed. No distress.  HENT:  Head: Normocephalic.  Right Ear: External ear normal.  Left Ear: External ear normal.  Nose: Nose normal.  Mouth/Throat: Oropharynx is clear and moist.  Eyes: Conjunctivae are normal. Pupils are equal, round, and reactive to light. Right eye exhibits no discharge. Left eye exhibits no discharge.  Neck: Normal range of motion. Neck supple. No JVD present. No tracheal deviation present. No thyromegaly present.  Cardiovascular: Normal rate, regular rhythm and normal heart sounds.   Pulmonary/Chest: No stridor. No respiratory distress. She has no wheezes.  Abdominal: Soft. Bowel sounds are normal. She exhibits no distension and no mass. There is no tenderness. There is no rebound and no guarding.  Musculoskeletal: She exhibits no edema and no tenderness.  Lymphadenopathy:    She has no cervical adenopathy.  Neurological: She is alert and oriented to person, place, and time. She displays tremor. She displays normal reflexes. No cranial nerve deficit. She exhibits normal muscle tone. Coordination abnormal.  Skin: No rash noted. No erythema.  Psychiatric: Her speech is normal.  Judgment normal. Her mood appears anxious. She is agitated and hyperactive. Cognition and memory are normal. She expresses no suicidal plans and no homicidal plans.  eryth throat  BP 130/88  Pulse 76  Temp(Src) 98.1 F (36.7 C) (Oral)  Resp 16  Wt 161 lb (73.029 kg)     Lab Results  Component Value Date   WBC 7.1 09/20/2012   HGB  13.7 09/20/2012   HCT 39.9 09/20/2012   PLT 332 09/20/2012   GLUCOSE 79 09/20/2012   CHOL 281* 11/07/2012   TRIG 263* 11/07/2012   HDL 34* 11/07/2012   LDLCALC 194* 11/07/2012   ALT 38* 11/07/2012   AST 25 11/07/2012   NA 137 09/20/2012   K 4.0 09/20/2012   CL 102 09/20/2012   CREATININE 0.76 09/20/2012   BUN 7 09/20/2012   CO2 27 09/20/2012   TSH 0.622 04/29/2012   A complex case    Assessment & Plan:

## 2012-11-27 NOTE — Assessment & Plan Note (Signed)
Hving tremors only now Clonazepam prn

## 2012-11-27 NOTE — Assessment & Plan Note (Addendum)
11/14 - started on 11/24/12 recullrent Dr Evelene Croon - appt Feb  Start Depakote and Lithium RTC 1 wk  Potential benefits of a long term lithium/depakote  use as well as potential risks  and complications were explained to the patient and were aknowledged.

## 2012-11-27 NOTE — Assessment & Plan Note (Signed)
See meds 

## 2012-12-04 ENCOUNTER — Encounter: Payer: Self-pay | Admitting: Internal Medicine

## 2012-12-04 ENCOUNTER — Ambulatory Visit (INDEPENDENT_AMBULATORY_CARE_PROVIDER_SITE_OTHER): Payer: 59 | Admitting: Internal Medicine

## 2012-12-04 VITALS — BP 114/70 | HR 68 | Temp 98.2°F | Resp 16 | Wt 162.2 lb

## 2012-12-04 DIAGNOSIS — F319 Bipolar disorder, unspecified: Secondary | ICD-10-CM

## 2012-12-04 DIAGNOSIS — Z23 Encounter for immunization: Secondary | ICD-10-CM

## 2012-12-04 DIAGNOSIS — Z0279 Encounter for issue of other medical certificate: Secondary | ICD-10-CM

## 2012-12-04 DIAGNOSIS — G4700 Insomnia, unspecified: Secondary | ICD-10-CM

## 2012-12-04 NOTE — Progress Notes (Signed)
Subjective:  C/o going into a manic stage 8-10 days ago. She was hyperactive, sleepless, taking up on different projects etc. Better now  F/u hives -  better F/u FMS - better on Cymbalta  F/u sweats, esp night sweats   Abd pain is better on gluten free    HPI  F/u of complains of overwhelming exhaustion and hypersomnia - better on Cymbalta Fatigue despite sleep - better +snoring most nights - denies OSA Myalgias and stiffness without swelling in joints - affects back and neck - better, but not by much (flexeril helped, had to take 2 Norco a day lately) Takes less klonopin 1-3x/day for tremor symptoms - no other med changes, pain meds - not every day Denies overlap with chronic biploar symptoms  Wt Readings from Last 3 Encounters:  12/04/12 162 lb 4 oz (73.596 kg)  11/27/12 161 lb (73.029 kg)  11/07/12 165 lb (74.844 kg)   BP Readings from Last 3 Encounters:  12/04/12 114/70  11/27/12 130/88  11/07/12 130/80      Past Medical History  Diagnosis Date  . Blood type, Rh negative   . IBS (irritable bowel syndrome)   . Acid reflux   . Bipolar 1 disorder   . PIH (pregnancy induced hypertension), antepartum     icu after delivery and currently hypertensive  . B12 deficiency   . Spastic colon   . Hives   . Essential tremor   . Allergy   . Anemia   . Anxiety   . Depression   . Hyperlipidemia   . Neuromuscular disorder     fibromylagia  . Fibromyalgia   . Cervical dysplasia     Review of Systems  Constitutional: Positive for fatigue. Negative for unexpected weight change.  HENT: Negative for congestion, dental problem, hearing loss and sore throat.   Respiratory: Negative for shortness of breath.   Cardiovascular: Negative for palpitations and leg swelling.  Gastrointestinal: Negative for nausea, abdominal pain and diarrhea.  Musculoskeletal: Negative for joint swelling and myalgias. Arthralgias: better.  Neurological: Negative for tremors, facial asymmetry,  speech difficulty, weakness, light-headedness and headaches.  Hematological: Negative for adenopathy.  Psychiatric/Behavioral: Negative for suicidal ideas, hallucinations, confusion, sleep disturbance, dysphoric mood, decreased concentration and agitation. The patient is nervous/anxious. The patient is not hyperactive.        Objective:   Physical Exam  Constitutional: She is oriented to person, place, and time. She appears well-developed. No distress.  HENT:  Head: Normocephalic.  Right Ear: External ear normal.  Left Ear: External ear normal.  Nose: Nose normal.  Mouth/Throat: Oropharynx is clear and moist.  Eyes: Conjunctivae are normal. Pupils are equal, round, and reactive to light. Right eye exhibits no discharge. Left eye exhibits no discharge.  Neck: Normal range of motion. Neck supple. No JVD present. No tracheal deviation present. No thyromegaly present.  Cardiovascular: Normal rate, regular rhythm and normal heart sounds.   Pulmonary/Chest: No stridor. No respiratory distress. She has no wheezes.  Abdominal: Soft. Bowel sounds are normal. She exhibits no distension and no mass. There is no tenderness. There is no rebound and no guarding.  Musculoskeletal: She exhibits no edema and no tenderness.  Lymphadenopathy:    She has no cervical adenopathy.  Neurological: She is alert and oriented to person, place, and time. She displays no tremor and normal reflexes. No cranial nerve deficit. She exhibits normal muscle tone. Coordination normal.  Skin: No rash noted. No erythema.  Psychiatric: Her speech is normal. Judgment normal.  Her mood appears anxious. She is agitated and hyperactive. Cognition and memory are normal. She expresses no suicidal plans and no homicidal plans.    BP 114/70  Pulse 68  Temp(Src) 98.2 F (36.8 C) (Oral)  Resp 16  Wt 162 lb 4 oz (73.596 kg)     Lab Results  Component Value Date   WBC 7.1 09/20/2012   HGB 13.7 09/20/2012   HCT 39.9 09/20/2012   PLT  332 09/20/2012   GLUCOSE 79 09/20/2012   CHOL 281* 11/07/2012   TRIG 263* 11/07/2012   HDL 34* 11/07/2012   LDLCALC 194* 11/07/2012   ALT 38* 11/07/2012   AST 25 11/07/2012   NA 137 09/20/2012   K 4.0 09/20/2012   CL 102 09/20/2012   CREATININE 0.76 09/20/2012   BUN 7 09/20/2012   CO2 27 09/20/2012   TSH 0.622 04/29/2012       Assessment & Plan:

## 2012-12-04 NOTE — Assessment & Plan Note (Signed)
Continue with current prescription therapy as reflected on the Med list. Labs Psych consult pending Dec 9th Out of work 11/26/12 - 01/12/13 to work 01/13/13 if ok

## 2012-12-04 NOTE — Progress Notes (Signed)
Pre visit review using our clinic review tool, if applicable. No additional management support is needed unless otherwise documented below in the visit note. 

## 2012-12-04 NOTE — Assessment & Plan Note (Signed)
Better on rx

## 2012-12-17 ENCOUNTER — Other Ambulatory Visit: Payer: Self-pay | Admitting: *Deleted

## 2012-12-17 MED ORDER — DULOXETINE HCL 60 MG PO CPEP: 60.0000 mg | ORAL_CAPSULE | Freq: Every day | ORAL | Status: DC

## 2012-12-18 ENCOUNTER — Other Ambulatory Visit: Payer: Self-pay | Admitting: Internal Medicine

## 2012-12-19 ENCOUNTER — Other Ambulatory Visit: Payer: Self-pay | Admitting: Internal Medicine

## 2012-12-24 ENCOUNTER — Ambulatory Visit (INDEPENDENT_AMBULATORY_CARE_PROVIDER_SITE_OTHER): Payer: 59 | Admitting: Psychiatry

## 2012-12-24 DIAGNOSIS — F3160 Bipolar disorder, current episode mixed, unspecified: Secondary | ICD-10-CM

## 2012-12-24 DIAGNOSIS — F3130 Bipolar disorder, current episode depressed, mild or moderate severity, unspecified: Secondary | ICD-10-CM
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
## 2012-12-26 ENCOUNTER — Encounter: Payer: Self-pay | Admitting: Internal Medicine

## 2012-12-31 ENCOUNTER — Telehealth: Payer: Self-pay | Admitting: Internal Medicine

## 2012-12-31 ENCOUNTER — Encounter: Payer: Self-pay | Admitting: Internal Medicine

## 2012-12-31 NOTE — Telephone Encounter (Signed)
I called to reschedule the patients appt for this Friday.  She told me she has sent a message through my chart to Dr. Posey Rea.  She wants the return to work date on her disability paperwork changed to Dec. 29th instead of yesterday.  The e-mail is in her chart.  Is this something you can fix?

## 2013-01-01 ENCOUNTER — Encounter: Payer: Self-pay | Admitting: Internal Medicine

## 2013-01-01 ENCOUNTER — Telehealth (HOSPITAL_COMMUNITY): Payer: Self-pay | Admitting: *Deleted

## 2013-01-01 NOTE — Telephone Encounter (Signed)
Pt left WU:JWJXBJYN with Lithium.Needs to change medication,not tolerating this.Still hungry and eating all the time.Weight increased 13 lbs.Also causing acne breakout on face/neck/chest/back.

## 2013-01-01 NOTE — Telephone Encounter (Signed)
I called [employer] to advise of return to work date of 01/13/13 per MD. I left a detailed message for Tresa Endo to return my call.

## 2013-01-02 ENCOUNTER — Telehealth (HOSPITAL_COMMUNITY): Payer: Self-pay | Admitting: *Deleted

## 2013-01-02 ENCOUNTER — Other Ambulatory Visit: Payer: Self-pay | Admitting: Internal Medicine

## 2013-01-02 DIAGNOSIS — F3130 Bipolar disorder, current episode depressed, mild or moderate severity, unspecified: Secondary | ICD-10-CM

## 2013-01-02 LAB — HEPATIC FUNCTION PANEL
ALT: 15 U/L (ref 0–35)
AST: 16 U/L (ref 0–37)
Alkaline Phosphatase: 51 U/L (ref 39–117)
Bilirubin, Direct: <0.1 mg/dL (ref 0.0–0.3)
Total Protein: 6.7 g/dL (ref 6.0–8.3)

## 2013-01-02 LAB — BASIC METABOLIC PANEL
BUN: 9 mg/dL (ref 6–23)
CO2: 24 mEq/L (ref 19–32)
Chloride: 104 mEq/L (ref 96–112)
Creat: 0.66 mg/dL (ref 0.50–1.10)
Glucose, Bld: 83 mg/dL (ref 70–99)
Potassium: 4.2 mEq/L (ref 3.5–5.3)
Sodium: 138 mEq/L (ref 135–145)

## 2013-01-02 LAB — TSH: TSH: 1.399 u[IU]/mL (ref 0.350–4.500)

## 2013-01-02 MED ORDER — LITHIUM CARBONATE 300 MG PO TABS: 300.0000 mg | ORAL_TABLET | Freq: Every day | ORAL | Status: DC

## 2013-01-02 MED ORDER — QUETIAPINE FUMARATE 50 MG PO TABS: 50.0000 mg | ORAL_TABLET | Freq: Two times a day (BID) | ORAL | Status: DC

## 2013-01-02 NOTE — Telephone Encounter (Signed)
Called [facility] @ [address] gave our updated fax/phone. I am faxing letter dated 01/01/13 to [phone]. Christine Duncan is faxing a form for MD to complete explaining why she is to return when she is.

## 2013-01-02 NOTE — Telephone Encounter (Signed)
Dr. Daleen Bo called, left VM, to give orders for patient: Decrease Lithium to 300 mg ONCE daily Start Seroquel 50 mg BID - unless pt has taken in past and had problems.  Contacted pt to give new medication orders left by MD: Pt verbalized understanding of Lithium dose change.Stated she had never taken Seroquel to her knowledge and would try.

## 2013-01-03 ENCOUNTER — Ambulatory Visit: Payer: Self-pay | Admitting: Internal Medicine

## 2013-01-03 LAB — VALPROIC ACID LEVEL: Valproic Acid Lvl: <1 ug/mL — ABNORMAL LOW (ref 50.0–100.0)

## 2013-01-07 ENCOUNTER — Ambulatory Visit (INDEPENDENT_AMBULATORY_CARE_PROVIDER_SITE_OTHER): Payer: 59 | Admitting: Psychiatry

## 2013-01-07 ENCOUNTER — Telehealth (HOSPITAL_COMMUNITY): Payer: Self-pay | Admitting: *Deleted

## 2013-01-07 ENCOUNTER — Encounter: Payer: Self-pay | Admitting: Internal Medicine

## 2013-01-07 ENCOUNTER — Ambulatory Visit (INDEPENDENT_AMBULATORY_CARE_PROVIDER_SITE_OTHER): Payer: 59 | Admitting: Internal Medicine

## 2013-01-07 VITALS — BP 120/72 | HR 92 | Temp 98.7°F | Resp 16 | Wt 176.0 lb

## 2013-01-07 VITALS — BP 121/77 | HR 109 | Wt 173.0 lb

## 2013-01-07 DIAGNOSIS — F3130 Bipolar disorder, current episode depressed, mild or moderate severity, unspecified: Secondary | ICD-10-CM

## 2013-01-07 DIAGNOSIS — F319 Bipolar disorder, unspecified: Secondary | ICD-10-CM

## 2013-01-07 MED ORDER — DULOXETINE HCL 30 MG PO CPEP: 90.0000 mg | ORAL_CAPSULE | Freq: Every day | ORAL | Status: DC

## 2013-01-07 MED ORDER — QUETIAPINE FUMARATE 50 MG PO TABS: ORAL_TABLET | ORAL | Status: DC

## 2013-01-07 MED ORDER — LITHIUM CARBONATE 300 MG PO TABS: ORAL_TABLET | ORAL | Status: DC

## 2013-01-07 MED ORDER — QUETIAPINE FUMARATE 25 MG PO TABS: ORAL_TABLET | ORAL | Status: DC

## 2013-01-07 NOTE — Progress Notes (Signed)
[facility] [id number] Progress Note  LERIN JECH [id number] 32 y.o.  01/07/2013 11:53 AM  Chief Complaint: "feeling tired, gaining weight"  History of Present Illness: Patient is a 32 year old Caucasian female with bipolar disorder. Patient was seen for an initial appointment last visit and she was tapered off Depakote. Patient reports tolerating this taper well. In the meanwhile between her appointments patient called stating the lithium was causing her to have acne and increased weight gain. She was recommended to taper off the lithium to 300 mg and to start Seroquel at 25 mg twice daily. Patient reports today that the Seroquel has not been helpful. She reports her mood has gotten better and she is not crying as often. She is able to get dressed and has more energy. However she denies that the Seroquel has anything to do with the improvement. She is very concerned about her continuing weight gain. sHe reports having headaches, exhaustion, night sweats, increased pain from her fibromyalgia and issues with her body image. She denies any manic symptoms. She denies any suicidal thoughts.  She is looking to go back to work next Monday. She is endorsing some difficulty with concentration.   Suicidal Ideation: No Plan Formed: No Patient has means to carry out plan: No  Homicidal Ideation: No Plan Formed: No Patient has means to carry out plan: No  Review of Systems: Psychiatric: Agitation: No Hallucination: No Depressed Mood: Yes Insomnia: No Hypersomnia: no Altered Concentration: Yes Feels Worthless: No Grandiose Ideas: No Belief In Special Powers: No New/Increased Substance Abuse: No Compulsions: No  Neurologic: Headache: Yes Seizure: No Paresthesias: No  Past Medical Family, Social History: Multiple medical issues, family history of Bipolar disorder.  Outpatient Encounter Prescriptions as of 01/07/2013  Medication Sig  . Cholecalciferol (VITAMIN D PO) Take by  mouth.  . clonazePAM (KLONOPIN) 1 MG disintegrating tablet Take 1 tablet (1 mg total) by mouth 3 (three) times daily as needed.  . Cyanocobalamin 1000 MCG SUBL Place 1 tablet (1,000 mcg total) under the tongue daily.  . cyclobenzaprine (FLEXERIL) 5 MG tablet Take 1 tablet (5 mg total) by mouth 3 (three) times daily as needed for muscle spasms.  . CYMBALTA 30 MG capsule Take one capsule by mouth one time daily  . DULoxetine (CYMBALTA) 60 MG capsule Take 1 capsule (60 mg total) by mouth daily.  . fluconazole (DIFLUCAN) 150 MG tablet Take 1 tablet (150 mg total) by mouth once.  Marland Kitchen HYDROcodone-acetaminophen (NORCO) 7.5-325 MG per tablet Take 1 tablet by mouth 4 (four) times daily as needed.  . hydrOXYzine (ATARAX/VISTARIL) 25 MG tablet Take 1 tablet (25 mg total) by mouth every 8 (eight) hours as needed for itching.  Marland Kitchen L-Methylfolate-Algae (DEPLIN 15) 15-90.314 MG CAPS Take 1 capsule by mouth every morning.  . lithium 300 MG tablet Take 1 tablet (300 mg total) by mouth daily.  Marland Kitchen loratadine (CLARITIN) 10 MG tablet Take 1 tablet (10 mg total) by mouth daily.  . norethindrone-ethinyl estradiol (JUNEL FE,GILDESS FE,LOESTRIN FE) 1-20 MG-MCG tablet Take 1 tablet by mouth daily.  Marland Kitchen omeprazole (PRILOSEC) 20 MG capsule Take 2 capsules (40 mg total) by mouth daily.  . QUEtiapine (SEROQUEL) 50 MG tablet Take 1 tablet (50 mg total) by mouth 2 (two) times daily.    Past Psychiatric History/Hospitalization(s): Anxiety: Yes Bipolar Disorder: Yes Depression: Yes Mania: No Psychosis: No Schizophrenia: No Personality Disorder: No Hospitalization for psychiatric illness: No History of Electroconvulsive Shock Therapy: No Prior Suicide Attempts: No  Physical Exam: Constitutional:  BP 121/77  Pulse 109  Wt 173 lb (78.472 kg)  General Appearance: alert, oriented, no acute distress  Musculoskeletal: Strength & Muscle Tone: within normal limits Gait & Station: normal Patient leans:  N/A  Psychiatric: Speech (describe rate, volume, coherence, spontaneity, and abnormalities if any): soft, normal rate, spontaneous speech present  Thought Process (describe rate, content, abstract reasoning, and computation): normal  Associations: Coherent  Thoughts: normal  Mental Status: Orientation: oriented to person, place, time/date and situation Mood & Affect: flat affect Attention Span & Concentration: decreased from before  Medical Decision Making (Choose Three): Established Problem, Stable/Improving (1), Review of Psycho-Social Stressors (1), Review of Medication Regimen & Side Effects (2) and Review of New Medication or Change in Dosage (2)  Assessment: Axis I: Bipolar disorder, depressed  Axis II: deferred  Axis III: Fibromyalgia  Axis IV: marital stressors  Axis V: 75   Plan:  A recommend tapering the Seroquel to 25 mg once daily for 4 days and then stop  stop the lithium after one week at 300 mg once daily. Increase Cymbalta to 90 mg once daily Counseled patient that the Seroquel was probably helping with her mood symptoms and to take Seroquel at 25 mg if she has increasing anxiety. Counseled patient to exercise daily. Return to clinic in 2-3 weeks time a call before if needed. Time spent in appointment was about 25 minutes, of moderate complexity.  Patrick North, MD 01/07/2013

## 2013-01-07 NOTE — Assessment & Plan Note (Signed)
Chronic  Dr Evelene Croon

## 2013-01-07 NOTE — Progress Notes (Signed)
Pre visit review using our clinic review tool, if applicable. No additional management support is needed unless otherwise documented below in the visit note. 

## 2013-01-07 NOTE — Telephone Encounter (Signed)
WU:JWJXBJYN called to request clarification of Cymbalta order [30mg  3/day,#30] and Seroquel order [50 mg,take for 4 days and stop,#60]  Per Dr. Daleen Bo:  Cymbalta is 30 mg, take 3/day, #90  Seroquel is 25 mg, Take for 4 days and then stop, #4  Both Rx called to pharmacy

## 2013-01-15 ENCOUNTER — Telehealth (HOSPITAL_COMMUNITY): Payer: Self-pay | Admitting: *Deleted

## 2013-01-15 NOTE — Telephone Encounter (Signed)
Informed pt per Dr. Daleen Bo, that Lamictal not started at last appt.other medication changes were made, and MD did not want to make any more until next appt.Pt verbalized understanding.

## 2013-02-03 ENCOUNTER — Ambulatory Visit (INDEPENDENT_AMBULATORY_CARE_PROVIDER_SITE_OTHER): Payer: 59 | Admitting: Psychiatry

## 2013-02-03 VITALS — BP 120/76 | HR 88 | Ht 68.0 in | Wt 170.2 lb

## 2013-02-03 DIAGNOSIS — F3110 Bipolar disorder, current episode manic without psychotic features, unspecified: Secondary | ICD-10-CM

## 2013-02-03 NOTE — Progress Notes (Signed)
Patient ID: Christine Duncan, female   DOB: 16-Jun-1980, 33 y.o.   MRN: [id number]  [facility] [id number] Progress Note  CHARDAY CAPETILLO [id number] 33 y.o.  02/03/2013 12:52 PM  Chief Complaint: "concentration"  History of Present Illness: Patient is a 33 year old Caucasian female with bipolar disorder. Was able to taper off the Lithium and seroquel without problems. She lost 10.00 lbs. Continues to have difficulty with concentration, feels she is distracted easily at work. At home too she is unable to complete her chores. Patient frustrated about her inability to do her work without interruptions . Improved in detail she does report that she's been catching up on about 800 e-mail prior to leaving work  3 months ago when she became sick. She does endorse being overwhelmed at work.  Fair sleep and appetite. She denies any mood symptoms .  She denies any manic symptoms. She denies any suicidal thoughts.      Suicidal Ideation: No Plan Formed: No Patient has means to carry out plan: No  Homicidal Ideation: No Plan Formed: No Patient has means to carry out plan: No  Review of Systems: Psychiatric: Agitation: No Hallucination: No Depressed Mood: no Insomnia: No Hypersomnia: no Altered Concentration: Yes Feels Worthless: No Grandiose Ideas: No Belief In Special Powers: No New/Increased Substance Abuse: No Compulsions: No  Neurologic: Headache: Yes Seizure: No Paresthesias: No  Past Medical Family, Social History: Multiple medical issues, family history of Bipolar disorder.  Outpatient Encounter Prescriptions as of 02/03/2013  Medication Sig  . Cholecalciferol (VITAMIN D PO) Take by mouth.  . clonazePAM (KLONOPIN) 1 MG disintegrating tablet Take 1 tablet (1 mg total) by mouth 3 (three) times daily as needed.  . Cyanocobalamin 1000 MCG SUBL Place 1 tablet (1,000 mcg total) under the tongue daily.  . cyclobenzaprine (FLEXERIL) 5 MG tablet Take 1 tablet (5 mg total) by mouth 3  (three) times daily as needed for muscle spasms.  . DULoxetine (CYMBALTA) 30 MG capsule Take 3 capsules (90 mg total) by mouth daily.  . fluconazole (DIFLUCAN) 150 MG tablet Take 1 tablet (150 mg total) by mouth once.  Marland Kitchen HYDROcodone-acetaminophen (NORCO) 7.5-325 MG per tablet Take 1 tablet by mouth 4 (four) times daily as needed.  . hydrOXYzine (ATARAX/VISTARIL) 25 MG tablet Take 1 tablet (25 mg total) by mouth every 8 (eight) hours as needed for itching.  Marland Kitchen L-Methylfolate-Algae (DEPLIN 15) 15-90.314 MG CAPS Take 1 capsule by mouth every morning.  . lithium 300 MG tablet Take 1 tablet daily, stop after 1 week  . loratadine (CLARITIN) 10 MG tablet Take 1 tablet (10 mg total) by mouth daily.  . norethindrone-ethinyl estradiol (JUNEL FE,GILDESS FE,LOESTRIN FE) 1-20 MG-MCG tablet Take 1 tablet by mouth daily.  Marland Kitchen omeprazole (PRILOSEC) 20 MG capsule Take 2 capsules (40 mg total) by mouth daily.  . QUEtiapine (SEROQUEL) 25 MG tablet Take 1 tablet for 4 days then stop.    Past Psychiatric History/Hospitalization(s): Anxiety: Yes Bipolar Disorder: Yes Depression: Yes Mania: No Psychosis: No Schizophrenia: No Personality Disorder: No Hospitalization for psychiatric illness: No History of Electroconvulsive Shock Therapy: No Prior Suicide Attempts: No  Physical Exam: Constitutional:  There were no vitals taken for this visit.  General Appearance: alert, oriented, no acute distress  Musculoskeletal: Strength & Muscle Tone: within normal limits Gait & Station: normal Patient leans: N/A  Psychiatric: Speech (describe rate, volume, coherence, spontaneity, and abnormalities if any): soft, normal rate, spontaneous speech present  Thought Process (describe rate, content, abstract reasoning, and  computation): normal  Associations: Coherent  Thoughts: normal  Mental Status: Orientation: oriented to person, place, time/date and situation Mood & Affect: flat affect Attention Span &  Concentration: decreased from before  Medical Decision Making (Choose Three): Established Problem, Stable/Improving (1), Review of Psycho-Social Stressors (1), Review of Medication Regimen & Side Effects (2) and Review of New Medication or Change in Dosage (2)  Assessment: Axis I: Bipolar disorder, depressed  Axis II: deferred  Axis III: Fibromyalgia  Axis IV: marital stressors, work stress  Axis V: 70   Plan:  Continue Cymbalta at 90 mg once daily Counseled patient to exercise daily. Patient to start therapy to help with her work stress Return to clinic in 2-3 months time or call before if needed. Time spent in appointment was about 25 minutes, of moderate complexity. Counseled on strategies to cope with her work stress  Elvin So, MD 02/03/2013

## 2013-02-27 ENCOUNTER — Other Ambulatory Visit: Payer: Self-pay

## 2013-02-27 ENCOUNTER — Other Ambulatory Visit: Payer: Self-pay | Admitting: Gynecology

## 2013-02-27 ENCOUNTER — Other Ambulatory Visit: Payer: Self-pay | Admitting: Internal Medicine

## 2013-02-27 MED ORDER — OMEPRAZOLE 20 MG PO CPDR: DELAYED_RELEASE_CAPSULE | ORAL | Status: DC

## 2013-03-03 ENCOUNTER — Encounter: Payer: Self-pay | Admitting: Internal Medicine

## 2013-03-05 ENCOUNTER — Ambulatory Visit (INDEPENDENT_AMBULATORY_CARE_PROVIDER_SITE_OTHER): Payer: 59 | Admitting: Psychology

## 2013-03-05 ENCOUNTER — Encounter (HOSPITAL_COMMUNITY): Payer: Self-pay | Admitting: Psychology

## 2013-03-05 DIAGNOSIS — F319 Bipolar disorder, unspecified: Secondary | ICD-10-CM

## 2013-03-05 NOTE — Progress Notes (Signed)
Patient:   Christine Duncan   DOB:   06/05/1980  MR Number:  [id number]  Location:  [facility] [address] [id number] [place] [address] Dept: [phone]           Date of Service:   03/05/13  Start Time:   12.30pm End Time:   1.30pm  Provider/Observer:  Jan Fireman [facility]       Billing Code/Service: [phone]  Chief Complaint:     Chief Complaint  Patient presents with  . Stress    Reason for Service:  Pt has been referred for counseling by Dr. Einar Grad.  She reported dx w/ Bipolar D/O at age 73y/o and tx by psychiatrist.  Pt reported manic episode prior to beginning tx w/ Dr. Einar Grad in December 2014 and out on short term disability for 8 weeks at that time.  Pt reports stressors are at home. Pt reports husband has a drinking problem that resulted in losing license 5 years ago and since then she has been transporting him to work and as only driving parent responsible for all children's transportation.  She also reports limited assistance from husband in parenting and household responsibilities.  She reports husband doesn't admit to problem and has been unable to "cut back' as stated in past.  Pt repors they have separated several times in the past and has consider divorce recently.  Pt reports support from her family but feels like asking too much from them.   Current Status:  Pt reports easily frustrated, snapping at kids for "little things.  Pt reports poor concentration and feels tired a lot.  Pt reports sleep is good- usually in bed around 9pm on weekdays.  Pt reports that she doesn't have any "down time' for self.  Pt reports feeling depressed feelings- but feels situational given stressors in marriage.    Reliability of Information: Pt provided information.  Dr. Einar Grad records reviewed.   Behavioral Observation: Christine Duncan  presents as a 33 y.o.-year-old Caucasian Female who appeared her stated age. her dress  was Appropriate and she was Well Groomed and her manners were Appropriate to the situation.  There were not any physical disabilities noted.  she displayed an appropriate level of cooperation and motivation.    Interactions:    Active   Attention:   normal  Memory:   normal  Visuo-spatial:   not examined  Speech (Volume):  normal  Speech:   normal pitch and normal volume  Thought Process:  Coherent and Relevant  Though Content:  WNL  Orientation:   person, place, time/date and situation  Judgment:   Good  Planning:   Good  Affect:    Depressed  Mood:    Depressed and Irritable  Insight:   Good  Intelligence:   normal  Marital Status/Living: Pt is married 15 years to husband Lennette Bihari.  They have 2 children 8y/o daughter Wells Guiles and 3y/o son Linton Rump.  Pt reports significant marital stressors.  Pt reports emotional support and caregiver support from her family.    Current Employment: Pt works for [employer] as Scientist, forensic for past 5 years.  Working for [employer] for 8 years.  Substance Use:  No concerns of substance abuse are reported.    Education:   HS Graduate  Medical History:   Past Medical History  Diagnosis Date  . Blood type, Rh negative   . IBS (irritable bowel syndrome)   . Acid reflux   .  Bipolar 1 disorder   . PIH (pregnancy induced hypertension), antepartum     icu after delivery and currently hypertensive  . B12 deficiency   . Spastic colon   . Hives   . Essential tremor   . Allergy   . Anemia   . Anxiety   . Depression   . Hyperlipidemia   . Neuromuscular disorder     fibromylagia  . Fibromyalgia   . Cervical dysplasia         Outpatient Encounter Prescriptions as of 03/05/2013  Medication Sig  . DULoxetine (CYMBALTA) 30 MG capsule Take 3 capsules (90 mg total) by mouth daily.  . Cholecalciferol (VITAMIN D PO) Take by mouth.  . clonazePAM (KLONOPIN) 1 MG disintegrating tablet Take 1 tablet (1 mg total) by mouth 3 (three) times daily as  needed.  . Cyanocobalamin 1000 MCG SUBL Place 1 tablet (1,000 mcg total) under the tongue daily.  . cyclobenzaprine (FLEXERIL) 5 MG tablet Take 1 tablet (5 mg total) by mouth 3 (three) times daily as needed for muscle spasms.  . fluconazole (DIFLUCAN) 150 MG tablet Take 1 tablet (150 mg total) by mouth once.  Marland Kitchen HYDROcodone-acetaminophen (NORCO) 7.5-325 MG per tablet Take 1 tablet by mouth 4 (four) times daily as needed.  . hydrOXYzine (ATARAX/VISTARIL) 25 MG tablet Take 1 tablet (25 mg total) by mouth every 8 (eight) hours as needed for itching.  Lenda Kelp FE 1/20 1-20 MG-MCG tablet Take one tablet by mouth one time daily  . L-Methylfolate-Algae (DEPLIN 15) 15-90.314 MG CAPS Take 1 capsule by mouth every morning.  . loratadine (CLARITIN) 10 MG tablet Take 1 tablet (10 mg total) by mouth daily.  Marland Kitchen omeprazole (PRILOSEC) 20 MG capsule Take 2 capsules by mouth daily. Please disregard last RX sent over signed by Dr Linda Hedges          Sexual History:   History  Sexual Activity  . Sexual Activity: Yes  . Birth Control/ Protection: Pill    Abuse/Trauma History: None reported.  Psychiatric History:  No counseling hx.   Family Med/Psych History:  Family History  Problem Relation Age of Onset  . Thyroid disease Mother   . Depression Mother   . Cancer Father 36    esoph ca  . Esophageal cancer Father   . Seizures Maternal Grandmother   . Thyroid disease Maternal Grandmother   . Depression Maternal Grandmother   . Hyperlipidemia Paternal Grandmother   . Diabetes Paternal Grandmother   . Breast cancer Paternal Grandmother 28  . Cancer Paternal Grandmother     Bladder and colon cancer  . Colon cancer Paternal Grandmother   . Bipolar disorder Paternal Grandmother   . Rectal cancer Neg Hx   . Stomach cancer Neg Hx   . Bipolar disorder Maternal Grandfather   . Alcohol abuse Paternal Grandfather     sober  . Drug abuse Paternal Grandfather     sober    Risk of Suicide/Violence: virtually  non-existent No SI/HI  Impression/DX:  Pt is a 33y/o female who presents for counseling to assist in coping w/ stressors and Bipolar D/O .  Pt reports majority of stressors are marital stressors and seeking counseling to assist in coping.  Pt reports some depressive symptoms current and most recent manic episode Nov 2014.  Pt no hx of counseling and seems motivated for tx.   Disposition/Plan:  F/u in 2 weeks for counseling coming w/ goals for counseling to develop tx plan.   Diagnosis:  DSORD BIPOLAR I, UNSPC, MOST RECENT EPSD

## 2013-04-04 ENCOUNTER — Ambulatory Visit (HOSPITAL_COMMUNITY): Payer: Self-pay | Admitting: Psychiatry

## 2013-04-08 ENCOUNTER — Ambulatory Visit (HOSPITAL_COMMUNITY): Payer: Self-pay | Admitting: Psychology

## 2013-04-10 ENCOUNTER — Encounter: Payer: Self-pay | Admitting: Internal Medicine

## 2013-04-10 ENCOUNTER — Ambulatory Visit (INDEPENDENT_AMBULATORY_CARE_PROVIDER_SITE_OTHER): Payer: 59 | Admitting: Internal Medicine

## 2013-04-10 VITALS — BP 120/88 | HR 88 | Temp 98.7°F | Resp 16

## 2013-04-10 DIAGNOSIS — M797 Fibromyalgia: Secondary | ICD-10-CM

## 2013-04-10 DIAGNOSIS — L509 Urticaria, unspecified: Secondary | ICD-10-CM

## 2013-04-10 DIAGNOSIS — IMO0001 Reserved for inherently not codable concepts without codable children: Secondary | ICD-10-CM

## 2013-04-10 DIAGNOSIS — I10 Essential (primary) hypertension: Secondary | ICD-10-CM

## 2013-04-10 DIAGNOSIS — J069 Acute upper respiratory infection, unspecified: Secondary | ICD-10-CM

## 2013-04-10 MED ORDER — METHYLPREDNISOLONE ACETATE 80 MG/ML IJ SUSP
80.0000 mg | Freq: Once | INTRAMUSCULAR | Status: AC
  Administered 2013-04-10: 80 mg via INTRAMUSCULAR

## 2013-04-10 MED ORDER — PROMETHAZINE-CODEINE 6.25-10 MG/5ML PO SYRP: 5.0000 mL | ORAL_SOLUTION | ORAL | Status: DC | PRN

## 2013-04-10 MED ORDER — RANITIDINE HCL 150 MG PO TABS: 150.0000 mg | ORAL_TABLET | Freq: Two times a day (BID) | ORAL | Status: DC

## 2013-04-10 NOTE — Progress Notes (Deleted)
Pre visit review using our clinic review tool, if applicable. No additional management support is needed unless otherwise documented below in the visit note. 

## 2013-04-10 NOTE — Progress Notes (Signed)
Subjective:    F/u hives -  better F/u FMS - better on Cymbalta  F/u sweats, esp night sweats   Abd pain is better on gluten free    URI  This is a new problem. The current episode started in the past 7 days. The problem has been gradually worsening. Associated symptoms include joint pain and a rash (hives). Pertinent negatives include no abdominal pain, congestion, diarrhea, headaches, nausea or sore throat. She has tried antihistamine and acetaminophen for the symptoms. The treatment provided no relief.    F/u of complains of overwhelming exhaustion and hypersomnia - better on Cymbalta Fatigue despite sleep - better +snoring most nights - denies OSA Myalgias and stiffness without swelling in joints - affects back and neck - better, but not by much (flexeril helped, had to take 2 Norco a day lately) Takes less klonopin 1-3x/day for tremor symptoms - no other med changes, pain meds - not every day Denies overlap with chronic biploar symptoms  Wt Readings from Last 3 Encounters:  02/03/13 170 lb 3.2 oz (77.202 kg)  01/07/13 173 lb (78.472 kg)  01/07/13 176 lb (79.833 kg)   BP Readings from Last 3 Encounters:  04/10/13 120/88  02/03/13 120/76  01/07/13 121/77      Past Medical History  Diagnosis Date  . Blood type, Rh negative   . IBS (irritable bowel syndrome)   . Acid reflux   . Bipolar 1 disorder   . PIH (pregnancy induced hypertension), antepartum     icu after delivery and currently hypertensive  . B12 deficiency   . Spastic colon   . Hives   . Essential tremor   . Allergy   . Anemia   . Anxiety   . Depression   . Hyperlipidemia   . Neuromuscular disorder     fibromylagia  . Fibromyalgia   . Cervical dysplasia     Review of Systems  Constitutional: Positive for fatigue. Negative for unexpected weight change.  HENT: Negative for congestion, dental problem, hearing loss and sore throat.   Respiratory: Negative for shortness of breath.    Cardiovascular: Negative for palpitations and leg swelling.  Gastrointestinal: Negative for nausea, abdominal pain and diarrhea.  Musculoskeletal: Positive for joint pain. Negative for joint swelling and myalgias. Arthralgias: better.  Skin: Positive for rash (hives).  Neurological: Negative for tremors, facial asymmetry, speech difficulty, weakness, light-headedness and headaches.  Hematological: Negative for adenopathy.  Psychiatric/Behavioral: Negative for suicidal ideas, hallucinations, confusion, sleep disturbance, dysphoric mood, decreased concentration and agitation. The patient is nervous/anxious. The patient is not hyperactive.        Objective:   Physical Exam  Constitutional: She is oriented to person, place, and time. She appears well-developed. No distress.  HENT:  Head: Normocephalic.  Right Ear: External ear normal.  Left Ear: External ear normal.  Nose: Nose normal.  Mouth/Throat: Oropharynx is clear and moist.  Eyes: Conjunctivae are normal. Pupils are equal, round, and reactive to light. Right eye exhibits no discharge. Left eye exhibits no discharge.  Neck: Normal range of motion. Neck supple. No JVD present. No tracheal deviation present. No thyromegaly present.  Cardiovascular: Normal rate, regular rhythm and normal heart sounds.   Pulmonary/Chest: No stridor. No respiratory distress. She has no wheezes.  Abdominal: Soft. Bowel sounds are normal. She exhibits no distension and no mass. There is no tenderness. There is no rebound and no guarding.  Musculoskeletal: She exhibits no edema and no tenderness.  Lymphadenopathy:    She  has no cervical adenopathy.  Neurological: She is alert and oriented to person, place, and time. She displays no tremor and normal reflexes. No cranial nerve deficit. She exhibits normal muscle tone. Coordination normal.  Skin: No rash noted. No erythema.  Psychiatric: Her speech is normal. Judgment normal. Her mood appears anxious. She is  agitated and hyperactive. Cognition and memory are normal. She expresses no suicidal plans and no homicidal plans.    BP 120/88  Pulse 88  Temp(Src) 98.7 F (37.1 C) (Oral)  Resp 16     Lab Results  Component Value Date   WBC 7.1 09/20/2012   HGB 13.7 09/20/2012   HCT 39.9 09/20/2012   PLT 332 09/20/2012   GLUCOSE 83 01/02/2013   CHOL 281* 11/07/2012   TRIG 263* 11/07/2012   HDL 34* 11/07/2012   LDLCALC 194* 11/07/2012   ALT 15 01/02/2013   AST 16 01/02/2013   NA 138 01/02/2013   K 4.2 01/02/2013   CL 104 01/02/2013   CREATININE 0.66 01/02/2013   BUN 9 01/02/2013   CO2 24 01/02/2013   TSH 1.399 01/02/2013       Assessment & Plan:

## 2013-04-10 NOTE — Assessment & Plan Note (Signed)
Prom-cod syr prn Rest

## 2013-04-10 NOTE — Assessment & Plan Note (Signed)
Continue with current prescription therapy as reflected on the Med list.  

## 2013-04-10 NOTE — Patient Instructions (Signed)
Use over-the-counter  "cold" medicines  such as  "Afrin" nasal spray for nasal congestion as directed instead. Use" Delsym" or" Robitussin" cough syrup varietis for cough.  You can use plain "Tylenol" or "Advi"l for fever, chills and achyness.   "Common cold" symptoms are usually triggered by a virus.  The antibiotics are usually not necessary. On average, a" viral cold" illness would take 4-7 days to resolve. Please, make an appointment if you are not better or if you're worse.  

## 2013-04-10 NOTE — Assessment & Plan Note (Signed)
Depomedrol 80 mg Continue with current prescription therapy as reflected on the Med list.

## 2013-04-11 ENCOUNTER — Telehealth: Payer: Self-pay | Admitting: Internal Medicine

## 2013-04-11 NOTE — Telephone Encounter (Signed)
Relevant patient education assigned to patient using Emmi. ° °

## 2013-04-23 ENCOUNTER — Encounter: Payer: Self-pay | Admitting: Internal Medicine

## 2013-04-23 DIAGNOSIS — F319 Bipolar disorder, unspecified: Secondary | ICD-10-CM

## 2013-04-23 DIAGNOSIS — E538 Deficiency of other specified B group vitamins: Secondary | ICD-10-CM

## 2013-04-23 DIAGNOSIS — G253 Myoclonus: Secondary | ICD-10-CM

## 2013-04-23 DIAGNOSIS — L509 Urticaria, unspecified: Secondary | ICD-10-CM

## 2013-04-25 ENCOUNTER — Other Ambulatory Visit: Payer: Self-pay | Admitting: Internal Medicine

## 2013-04-25 ENCOUNTER — Telehealth: Payer: Self-pay | Admitting: Internal Medicine

## 2013-04-25 DIAGNOSIS — F3130 Bipolar disorder, current episode depressed, mild or moderate severity, unspecified: Secondary | ICD-10-CM

## 2013-04-25 MED ORDER — DULOXETINE HCL 60 MG PO CPEP: 60.0000 mg | ORAL_CAPSULE | Freq: Every day | ORAL | Status: DC

## 2013-04-25 NOTE — Telephone Encounter (Signed)
Pt sent my chart messages to have refills.  She needs cymbalta and hydrocodone.  She now uses [facility] on [address].  She is out of Cymbalta since yesterday.

## 2013-04-25 NOTE — Telephone Encounter (Signed)
Patient notified that symbalta 60mg  will be called into pharmacy.  Instructed patient to schedule an appointment with Dr. Alain Marion regarding her request for hydrocodone refill.

## 2013-04-25 NOTE — Telephone Encounter (Signed)
Ok 60 mg/d - emailed Thx

## 2013-04-25 NOTE — Telephone Encounter (Signed)
Patient stopped by office requesting cymbalta 60 mg. Pt is persistent about having rx before the weekend. Explained to pt that I called pharmacy who stated that pt has 11 refills for her 30 mg at [facility]. Please advise?

## 2013-04-25 NOTE — Telephone Encounter (Signed)
Patient would like a call back in regards to an employee interaction and picking up a script.

## 2013-04-29 MED ORDER — HYDROCODONE-ACETAMINOPHEN 7.5-325 MG PO TABS: 1.0000 | ORAL_TABLET | Freq: Four times a day (QID) | ORAL | Status: DC | PRN

## 2013-04-29 NOTE — Telephone Encounter (Signed)
Done. Hydrocodone rx upfront for p/u. Pt informed via MyChart message.

## 2013-05-19 DIAGNOSIS — Z0279 Encounter for issue of other medical certificate: Secondary | ICD-10-CM

## 2013-05-26 ENCOUNTER — Encounter: Payer: Self-pay | Admitting: Internal Medicine

## 2013-05-28 NOTE — Telephone Encounter (Signed)
Attempted to reach patient. Mailbox is full, unable to leave VM.  Called to inform her that her forms were sent to scan on 05/20/13. As soon it is scanned in her chart we will print out and review the portion that she says was not completed. To expedite this process she may have the form we sent, re-faxed back to [place].

## 2013-05-29 NOTE — Telephone Encounter (Signed)
Called pt's case worker at [facility] [employer]) and gave her the last OV date for the patient. She will add this info to the pt's claim. Pt notified.

## 2013-07-07 ENCOUNTER — Telehealth: Payer: Self-pay | Admitting: *Deleted

## 2013-07-07 MED ORDER — NORETHIN ACE-ETH ESTRAD-FE 1-20 MG-MCG PO TABS: ORAL_TABLET | ORAL | Status: DC

## 2013-07-07 NOTE — Telephone Encounter (Signed)
Pt has new pharmacy would like refill for birth control pills sent there. rx sent with refill until annual in sept. 2015

## 2013-07-08 ENCOUNTER — Telehealth: Payer: Self-pay

## 2013-07-08 MED ORDER — NORETHIN ACE-ETH ESTRAD-FE 1-20 MG-MCG PO TABS: ORAL_TABLET | ORAL | Status: DC

## 2013-07-08 NOTE — Telephone Encounter (Signed)
Pharmacy sent note that ins will only cover a 90 day supply at retail pharmacy.

## 2013-08-27 ENCOUNTER — Ambulatory Visit (INDEPENDENT_AMBULATORY_CARE_PROVIDER_SITE_OTHER): Payer: 59 | Admitting: Internal Medicine

## 2013-08-27 ENCOUNTER — Encounter: Payer: Self-pay | Admitting: Internal Medicine

## 2013-08-27 ENCOUNTER — Ambulatory Visit (INDEPENDENT_AMBULATORY_CARE_PROVIDER_SITE_OTHER)
Admission: RE | Admit: 2013-08-27 | Discharge: 2013-08-27 | Disposition: A | Discharge status: Home / Self Care | Payer: 59 | Source: Ambulatory Visit | Attending: Internal Medicine | Admitting: Internal Medicine

## 2013-08-27 VITALS — BP 127/88 | HR 88 | Temp 98.9°F | Wt 183.4 lb

## 2013-08-27 DIAGNOSIS — M25569 Pain in unspecified knee: Secondary | ICD-10-CM

## 2013-08-27 DIAGNOSIS — M25562 Pain in left knee: Secondary | ICD-10-CM

## 2013-08-27 MED ORDER — HYDROCODONE-ACETAMINOPHEN 7.5-325 MG PO TABS: 1.0000 | ORAL_TABLET | Freq: Four times a day (QID) | ORAL | Status: DC | PRN

## 2013-08-27 NOTE — Progress Notes (Signed)
   Subjective:    Patient ID: Christine Duncan, female    DOB: 1980-04-03, 33 y.o.   MRN: [id number]  HPI Her symptoms began 08/23/13 as she was performing walking/running cycles. She completed 7 or 8 of these but had to "push through the pain". She did not hear or feel a pop specifically but noted acute pain while performing this exercise  The pain has persisted as dull to sharp up to a level III-5.  It is worse with position change. It also increases if the leg is immobilized. This causes an aching pain.  Ice and ibuprofen have been only of partial benefit  There has been some localized numbness.      Review of Systems  She has history of fibromyalgia. She's had exacerbation of back pain as well. She is on a narcotic pain medication.  There's been no change in temperature or color of the skin.  She denies any fever, chills, or sweats.  She has no associated myalgias.  There is no tingling in the lower extremity.  She's had no loss of control of bladder or bowels.      Objective:   Physical Exam Positive pertinent findings include: There is a small amount of effusion at the left knee. There is tenderness to palpation over the medial tibial head. There's also pain with lateral rotation of the lower leg. She is unable to complete heel or toe walking due to pain in the knee.   General appearance :adequately nourished; in no distress. Eyes: No conjunctival inflammation or scleral icterus is present. Heart:  Normal rate and regular rhythm. S1 and S2 normal without gallop, murmur, click, rub or other extra sounds   Lungs:Chest clear to auscultation; no wheezes, rhonchi,rales ,or rubs present.No increased work of breathing.  Skin:Warm & dry.  Intact without suspicious lesions or rashes ; no jaundice or tenting Lymphatic: No lymphadenopathy is noted about the head, neck, axilla            Assessment & Plan:  Plan: See orders and recommendations  #1 left knee pain over  the medial tibial head

## 2013-08-27 NOTE — Progress Notes (Signed)
Pre visit review using our clinic review tool, if applicable. No additional management support is needed unless otherwise documented below in the visit note. 

## 2013-08-27 NOTE — Patient Instructions (Signed)
Use an anti-inflammatory cream such as Aspercreme or Zostrix cream twice a day to the affected area as needed. In lieu of this warm moist compresses or  hot water bottle can be used. Do not apply ice .Please call if there is a significant change in symptoms  or progression of severity of symptoms . I recommend  Dr Charlann Boxer, Sports Medicine specialist., phone # [phone] if symptoms persist.

## 2013-09-02 ENCOUNTER — Ambulatory Visit (INDEPENDENT_AMBULATORY_CARE_PROVIDER_SITE_OTHER): Payer: 59 | Admitting: Family Medicine

## 2013-09-02 ENCOUNTER — Other Ambulatory Visit (INDEPENDENT_AMBULATORY_CARE_PROVIDER_SITE_OTHER): Payer: 59

## 2013-09-02 ENCOUNTER — Encounter: Payer: Self-pay | Admitting: Family Medicine

## 2013-09-02 VITALS — BP 132/82 | HR 97 | Ht 68.5 in | Wt 185.0 lb

## 2013-09-02 DIAGNOSIS — M25562 Pain in left knee: Secondary | ICD-10-CM

## 2013-09-02 DIAGNOSIS — M2142 Flat foot [pes planus] (acquired), left foot: Secondary | ICD-10-CM

## 2013-09-02 DIAGNOSIS — M25569 Pain in unspecified knee: Secondary | ICD-10-CM

## 2013-09-02 DIAGNOSIS — M7050 Other bursitis of knee, unspecified knee: Secondary | ICD-10-CM

## 2013-09-02 DIAGNOSIS — S83412A Sprain of medial collateral ligament of left knee, initial encounter: Secondary | ICD-10-CM

## 2013-09-02 DIAGNOSIS — M2141 Flat foot [pes planus] (acquired), right foot: Secondary | ICD-10-CM

## 2013-09-02 DIAGNOSIS — M2140 Flat foot [pes planus] (acquired), unspecified foot: Secondary | ICD-10-CM | POA: Insufficient documentation

## 2013-09-02 DIAGNOSIS — S83419A Sprain of medial collateral ligament of unspecified knee, initial encounter: Secondary | ICD-10-CM

## 2013-09-02 DIAGNOSIS — S83207A Unspecified tear of unspecified meniscus, current injury, left knee, initial encounter: Secondary | ICD-10-CM | POA: Insufficient documentation

## 2013-09-02 DIAGNOSIS — IMO0002 Reserved for concepts with insufficient information to code with codable children: Secondary | ICD-10-CM

## 2013-09-02 NOTE — Patient Instructions (Signed)
Very nice to meet you Ice 20 minutes 2 times daily. Usually after activity and before bed. Exercises 3 times a week. Alternate which one  Vitamin D 2000 IU daily. This can help with your aches and pain.  Pennsaid twice daily until I see you again Stop if it hurts your stomah. Consider the brace with activity.  Spenco orthotics online look for "total Support" Consider Eugenie Birks and New balance greater then 700. Come back in 2 weeks

## 2013-09-02 NOTE — Assessment & Plan Note (Signed)
The patient's biggest injury is likely more to the MCL tear. We discussed the possibility of wearing a knee immobilizer which patient declined. Patient was given a brace to for stabilization. We discussed an icing regimen as well as topical anti-inflammatories. Home exercise program was given. Patient try these interventions and come back again in 2 weeks for further evaluation. Patient continues to have discomfort I would like to ultrasound again as well as likely get x-rays.

## 2013-09-02 NOTE — Progress Notes (Signed)
[facility] [place], [place] [address] Phone: [phone] Subjective:    I'm seeing this patient by the request  of:  Walker Kehr, MD Christine Duncan M.D.  CC: Left knee pain  Christine Duncan is a 33 y.o. female coming in with complaint of left knee pain. Patient states that she has been exercising more frequently. Patient states 10 days ago she was doing a walking and running cycle and status she started having a significant amount pain on the medial aspect of her knee. Patient states there was a sharp pain that persisted as a dull ache through the night. Patient states if she tries to change position she has a severe sharp pain. Otherwise with sitting she is a dull aching pain. This is apparently a 5/10. Has been taking ice and ibuprofen with minimal benefit. Patient was given hydrocodone as well. Patient does have a past medical history significant for bipolar disease and fibromyalgia. Denies any nighttime awakening.     Past medical history, social, surgical and family history all reviewed in electronic medical record.   Review of Systems: No headache, visual changes, nausea, vomiting, diarrhea, constipation, dizziness, abdominal pain, skin rash, fevers, chills, night sweats, weight loss, swollen lymph nodes, body aches, joint swelling, muscle aches, chest pain, shortness of breath, mood changes.   Objective Blood pressure 132/82, pulse 97, height 5' 8.5" (1.74 m), weight 185 lb (83.915 kg), SpO2 99.00%.  General: No apparent distress alert and oriented x3 mood and affect normal, dressed appropriately.  HEENT: Pupils equal, extraocular movements intact  Respiratory: Patient's speak in full sentences and does not appear short of breath  Cardiovascular: No lower extremity edema, non tender, no erythema  Skin: Warm dry intact with no signs of infection or rash on extremities or on axial skeleton.  Abdomen: Soft nontender  Neuro: Cranial  nerves II through XII are intact, neurovascularly intact in all extremities with 2+ DTRs and 2+ pulses.  Lymph: No lymphadenopathy of posterior or anterior cervical chain or axillae bilaterally.  Gait normal with good balance and coordination.  MSK:  Non tender with full range of motion and good stability and symmetric strength and tone of shoulders, elbows, wrist, hip, and ankles bilaterally.  Knee: Left Severely tender to palpation over the medial joint line as well as the pes anserine area Inspection show some mild valgus deformity compared to the contralateral knee ROM full in flexion and extension and lower leg rotation. The patient does have gapping of the MCL compared to the contralateral side Positive Mcmurray's, Apley's, and Thessalonian tests. Non painful patellar compression. Patellar glide with minimal crepitus. Patellar and quadriceps tendons unremarkable. Hamstring and quadriceps strength is normal.  Contralateral knee has mild discomfort over the medial joint line as well.  MSK US performed of: Left knee This study was ordered, performed, and interpreted by Charlann Boxer D.O.  Knee: All structures visualized. Positive pain than what appears to be a very small meniscal tear noted of the posterior medial meniscus. There is no significant displacement. Moderate hypoechoic changes in the surrounding area. Anteromedial, anterolateral, and posterolateral menisci unremarkable without tearing, fraying, effusion, or displacement. Patellar Tendon unremarkable on long and transverse views without effusion. No abnormality of prepatellar bursa. Symptoms he does have what appears to be a tear at its insertion. There is increasing Doppler flow. Patient also has some hypoechoic changes around the pes anserine Patient also has pes planus severely bilaterally.  IMPRESSION:  Mild degenerative changes of  the meniscus, MCL tear, and mild pes anserine bursitis  Procedure: Real-time Ultrasound  Guided Injection of left knee Device: GE Logiq E  Ultrasound guided injection is preferred based studies that show increased duration, increased effect, greater accuracy, decreased procedural pain, increased response rate, and decreased cost with ultrasound guided versus blind injection.  Verbal informed consent obtained.  Time-out conducted.  Noted no overlying erythema, induration, or other signs of local infection.  Skin prepped in a sterile fashion.  Local anesthesia: Topical Ethyl chloride.  With sterile technique and under real time ultrasound guidance: With a 22-gauge 2 inch needle patient was injected with 4 cc of 0.5% Marcaine and 1 cc of Kenalog 40 mg/dL. This was from a superior lateral approach.  Completed without difficulty  Pain immediately resolved suggesting accurate placement of the medication.  Advised to call if fevers/chills, erythema, induration, drainage, or persistent bleeding.  Images permanently stored and available for review in the ultrasound unit.  Impression: Technically successful ultrasound guided injection.    Impression and Recommendations:     This case required medical decision making of moderate complexity.

## 2013-09-10 ENCOUNTER — Other Ambulatory Visit: Payer: Self-pay

## 2013-09-10 MED ORDER — NORETHIN ACE-ETH ESTRAD-FE 1-20 MG-MCG PO TABS
ORAL_TABLET | ORAL | Status: DC
Start: 2013-09-10 — End: 2013-11-12

## 2013-09-10 NOTE — Telephone Encounter (Signed)
Pharmacy requested 3 mos supply as ins only covers 3 mos at a time. They asked me to send #[phone].

## 2013-09-17 ENCOUNTER — Ambulatory Visit (INDEPENDENT_AMBULATORY_CARE_PROVIDER_SITE_OTHER): Payer: 59 | Admitting: Family Medicine

## 2013-09-17 ENCOUNTER — Other Ambulatory Visit (INDEPENDENT_AMBULATORY_CARE_PROVIDER_SITE_OTHER): Payer: 59

## 2013-09-17 ENCOUNTER — Encounter: Payer: Self-pay | Admitting: Family Medicine

## 2013-09-17 VITALS — BP 112/80 | HR 94 | Ht 68.5 in | Wt 183.0 lb

## 2013-09-17 DIAGNOSIS — M25562 Pain in left knee: Secondary | ICD-10-CM

## 2013-09-17 DIAGNOSIS — IMO0002 Reserved for concepts with insufficient information to code with codable children: Secondary | ICD-10-CM

## 2013-09-17 DIAGNOSIS — M2140 Flat foot [pes planus] (acquired), unspecified foot: Secondary | ICD-10-CM

## 2013-09-17 DIAGNOSIS — S83207D Unspecified tear of unspecified meniscus, current injury, left knee, subsequent encounter: Secondary | ICD-10-CM

## 2013-09-17 DIAGNOSIS — M2142 Flat foot [pes planus] (acquired), left foot: Secondary | ICD-10-CM

## 2013-09-17 DIAGNOSIS — S83419A Sprain of medial collateral ligament of unspecified knee, initial encounter: Secondary | ICD-10-CM

## 2013-09-17 DIAGNOSIS — M25569 Pain in unspecified knee: Secondary | ICD-10-CM

## 2013-09-17 DIAGNOSIS — M7050 Other bursitis of knee, unspecified knee: Secondary | ICD-10-CM

## 2013-09-17 DIAGNOSIS — S83412D Sprain of medial collateral ligament of left knee, subsequent encounter: Secondary | ICD-10-CM

## 2013-09-17 DIAGNOSIS — Z5189 Encounter for other specified aftercare: Secondary | ICD-10-CM

## 2013-09-17 DIAGNOSIS — M2141 Flat foot [pes planus] (acquired), right foot: Secondary | ICD-10-CM

## 2013-09-17 NOTE — Assessment & Plan Note (Signed)
Appears healed on ultrasound

## 2013-09-17 NOTE — Progress Notes (Signed)
[facility] [address], [place] [address] Phone: [phone] Subjective:     CC: Left knee pain, follow up  EHM:[id number] Christine Duncan is a 33 y.o. female coming in with complaint of left knee pain. Patient was seen previously and did have significant pathology to her knee. Patient did have an MCL sprain as well as the pes anserine bursitis. In addition patient also had a mild meniscal tear present. Patient elected on conservative therapy including bracing, icing, home exercises as well as a corticosteroid injection. Patient has been doing these interventions for the last 3 weeks. Patient states she is an illness in her family and she has not been able to do the exercises on her regular basis. Patient states though she is still 95% better. Still has a dull aching pain if she stands on it for too long or if she walks. Denies any nighttime awakening. Denies any swelling or any radiation of pain down the leg.     Past medical history, social, surgical and family history all reviewed in electronic medical record.   Review of Systems: No headache, visual changes, nausea, vomiting, diarrhea, constipation, dizziness, abdominal pain, skin rash, fevers, chills, night sweats, weight loss, swollen lymph nodes, body aches, joint swelling, muscle aches, chest pain, shortness of breath, mood changes.   Objective Blood pressure 112/80, pulse 94, height 5' 8.5" (1.74 m), weight 183 lb (83.008 kg), SpO2 98.00%.  General: No apparent distress alert and oriented x3 mood and affect normal, dressed appropriately.  HEENT: Pupils equal, extraocular movements intact  Respiratory: Patient's speak in full sentences and does not appear short of breath  Cardiovascular: No lower extremity edema, non tender, no erythema  Skin: Warm dry intact with no signs of infection or rash on extremities or on axial skeleton.  Abdomen: Soft nontender  Neuro: Cranial nerves II through XII  are intact, neurovascularly intact in all extremities with 2+ DTRs and 2+ pulses.  Lymph: No lymphadenopathy of posterior or anterior cervical chain or axillae bilaterally.  Gait normal with good balance and coordination.  MSK:  Non tender with full range of motion and good stability and symmetric strength and tone of shoulders, elbows, wrist, hip, and ankles bilaterally.  Knee: Left Mildly tender over the medial joint line no longer tender over the pes anserine. Inspection show some mild valgus deformity compared to the contralateral knee ROM full in flexion and extension and lower leg rotation. The patient does have gapping of the MCL compared to the contralateral side but does have endpoint Positive Mcmurray's, Apley's, and Thessalonian tests. Non painful patellar compression. Patellar glide with minimal crepitus. Patellar and quadriceps tendons unremarkable. Hamstring and quadriceps strength is normal.  Contralateral knee has mild discomfort over the medial joint line as well.  MSK US performed of: Left knee This study was ordered, performed, and interpreted by Charlann Boxer D.O.  Knee: All structures visualized. Posterior medial meniscal tear seen previously appears to be healed. There is no significant displacement.  Anteromedial, anterolateral, and posterolateral menisci unremarkable without tearing, fraying, effusion, or displacement. Patellar Tendon unremarkable on long and transverse views without effusion. No abnormality of prepatellar bursa. MCL tendon approximately 70% healed from prior exam. Still has hypoechoic changes surrounding the area but good Doppler flow.   IMPRESSION:  Healed meniscal tear with continued healing of MCL tear    Impression and Recommendations:     This case required medical decision making of moderate complexity.

## 2013-09-17 NOTE — Assessment & Plan Note (Signed)
Patient is improving as expected and. Patient though has not been doing exercises as much of a regular basis. We discussed the possibility of formal physical therapy which patient declined. Patient will start home exercises at least 3 times a week. We discussed increasing quadricep strengthening especially with either biking or elliptical that could be beneficial. We discussed continuing the icing protocol and wearing the brace at night. Patient does take pain medication secondary to her fibromyalgia. Patient will continue to try to increase her activity as tolerated over the course of the next 3 weeks but it continued to avoid any twisting or squatting motions. Patient and will followup in 3-4 weeks for further evaluation and treatment.  Spent greater than 25 minutes with patient face-to-face and had greater than 50% of counseling including as described above in assessment and plan.

## 2013-09-17 NOTE — Patient Instructions (Addendum)
It is good to see you.  Continue the brace with activity.  Ice is your friend.  Try exercises at least 3 times a week in handout.  This will likely take another 3-5 weeks to heal fully.  Avoid any twisting and squatting motions for another 2 weeks Ok to bike and even do elliptical.  Consider swimming. Upper body lifting is fine as well.  Come back in 3 weeks.

## 2013-09-17 NOTE — Assessment & Plan Note (Signed)
Continue to monitor

## 2013-09-17 NOTE — Assessment & Plan Note (Signed)
Healed with conservative therapy

## 2013-10-08 ENCOUNTER — Ambulatory Visit (INDEPENDENT_AMBULATORY_CARE_PROVIDER_SITE_OTHER): Payer: 59 | Admitting: Family Medicine

## 2013-10-08 ENCOUNTER — Encounter: Payer: Self-pay | Admitting: Family Medicine

## 2013-10-08 VITALS — BP 132/82 | HR 100 | Ht 68.5 in | Wt 187.0 lb

## 2013-10-08 DIAGNOSIS — S83412D Sprain of medial collateral ligament of left knee, subsequent encounter: Secondary | ICD-10-CM

## 2013-10-08 DIAGNOSIS — S83419A Sprain of medial collateral ligament of unspecified knee, initial encounter: Secondary | ICD-10-CM

## 2013-10-08 DIAGNOSIS — Z5189 Encounter for other specified aftercare: Secondary | ICD-10-CM

## 2013-10-08 NOTE — Progress Notes (Signed)
  [facility] [address] [place], [place] [address] Phone: [phone] Subjective:     CC: Left knee pain, follow up  GGY:IRSWNIOEVO Christine Duncan is a 33 y.o. female coming in with complaint of left knee pain. Patient was seen previously and did have significant pathology to her knee. Patient did have an MCL sprain. Patient states that overall she is doing significantly better. Actually has not increased her activity too much at this time. Denies any significant new symptoms. Denies any swelling. Overall feels better. Patient is to concern increasing her activity. Patient's goal is to start running.     Past medical history, social, surgical and family history all reviewed in electronic medical record.   Review of Systems: No headache, visual changes, nausea, vomiting, diarrhea, constipation, dizziness, abdominal pain, skin rash, fevers, chills, night sweats, weight loss, swollen lymph nodes, body aches, joint swelling, muscle aches, chest pain, shortness of breath, mood changes.   Objective Blood pressure 132/82, pulse 100, height 5' 8.5" (1.74 m), weight 187 lb (84.823 kg), SpO2 98.00%.  General: No apparent distress alert and oriented x3 mood and affect normal, dressed appropriately.  HEENT: Pupils equal, extraocular movements intact  Respiratory: Patient's speak in full sentences and does not appear short of breath  Cardiovascular: No lower extremity edema, non tender, no erythema  Skin: Warm dry intact with no signs of infection or rash on extremities or on axial skeleton.  Abdomen: Soft nontender  Neuro: Cranial nerves II through XII are intact, neurovascularly intact in all extremities with 2+ DTRs and 2+ pulses.  Lymph: No lymphadenopathy of posterior or anterior cervical chain or axillae bilaterally.  Gait normal with good balance and coordination.  MSK:  Non tender with full range of motion and good stability and symmetric strength and tone of  shoulders, elbows, wrist, hip, and ankles bilaterally.  Knee: Left Non- tender on exam Inspection show some mild valgus deformity compared to the contralateral knee ROM full in flexion and extension and lower leg rotation. The MCL is now solid with no gapping. Positive Mcmurray's, Apley's, and Thessalonian tests. Non painful patellar compression. Patellar glide with minimal crepitus. Patellar and quadriceps tendons unremarkable. Hamstring and quadriceps strength is normal.  Contralateral knee has mild discomfort over the medial joint line as well.  MSK US performed of: Left knee This study was ordered, performed, and interpreted by Charlann Boxer D.O.  Knee: All structures visualized. Posterior medial meniscal tear seen previously appears to be healed. There is no significant displacement.  Anteromedial, anterolateral, and posterolateral menisci unremarkable without tearing, fraying, effusion, or displacement. Patellar Tendon unremarkable on long and transverse views without effusion. No abnormality of prepatellar bursa. MCL has some mild hypoechoic changes at its origin but otherwise unremarkable.   IMPRESSION:  Healed meniscal tear as well as healed MCL tear.    Impression and Recommendations:     This case required medical decision making of moderate complexity.

## 2013-10-08 NOTE — Patient Instructions (Signed)
Good to see you.  Ice after activity New exercises 3 times a week.  Start running 2 times a week.  Lifting weights 2 times a week.  Start a walk-run progression: - Initially start one minute walking than one minute running for 20 mins in the first week,   then 25 mins during the second week, then 30 mins afterwards.  Once you have reached 30 mins: - Run 2 mins, then walk 1 min. -Then run 3 mins, and walk 1 min. -Then run 4 mins, and walk 1 min. -Then run 5 mins, and walk 1 min. -Slowly build up weekly to running 30 mins nonstop.  If painful at any of the steps, back up one step.  Call me in 2 weeks if not feeling like making headway We would get MRI Otherwise I would like to check in in 6 weeks.

## 2013-10-08 NOTE — Assessment & Plan Note (Signed)
Patient appears to be completely healed at this time. Patient encouraged to start increasing her activity and patient was given a progression for running. We discussed continuing icing and bracing as needed. Patient is also given a new and out of exercises to do for strengthening of the quadriceps. Patient's will start this. If any worsening pain patient will come back early and we'll consider further imaging such as an MRI. Patient is doing relatively well though I would like patient to come back again in 6 weeks to make sure that no worsening pain in that she's into the running progression safely.  Spent greater than 25 minutes with patient face-to-face and had greater than 50% of counseling including as described above in assessment and plan.

## 2013-10-30 ENCOUNTER — Encounter (HOSPITAL_COMMUNITY): Payer: Self-pay | Admitting: Psychology

## 2013-10-30 DIAGNOSIS — F319 Bipolar disorder, unspecified: Secondary | ICD-10-CM

## 2013-10-30 NOTE — Progress Notes (Signed)
Christine Duncan is a 34 y.o. female patient being discharge from counseling as didn't return for services.  Outpatient Therapist Discharge Summary  Christine Duncan    08-06-1980   Admission Date: 03/05/13   Discharge Date:  10/30/13  Reason for Discharge:  Didn't return for services Diagnosis:   DSORD BIPOLAR I, UNSPC, MOST RECENT EPSD    Comments:  Pt cancelled her f/u appointment and didn't reschedule.   Delle Reining, LPC

## 2013-11-06 DIAGNOSIS — Z0279 Encounter for issue of other medical certificate: Secondary | ICD-10-CM

## 2013-11-11 ENCOUNTER — Ambulatory Visit (INDEPENDENT_AMBULATORY_CARE_PROVIDER_SITE_OTHER): Payer: 59 | Admitting: Gynecology

## 2013-11-11 ENCOUNTER — Other Ambulatory Visit (HOSPITAL_COMMUNITY)
Admission: RE | Admit: 2013-11-11 | Discharge: 2013-11-11 | Disposition: A | Discharge status: Home / Self Care | Payer: 59 | Source: Ambulatory Visit | Attending: Gynecology | Admitting: Gynecology

## 2013-11-11 ENCOUNTER — Encounter: Payer: Self-pay | Admitting: Gynecology

## 2013-11-11 VITALS — BP 116/70 | Ht 69.0 in | Wt 189.0 lb

## 2013-11-11 DIAGNOSIS — Z01419 Encounter for gynecological examination (general) (routine) without abnormal findings: Secondary | ICD-10-CM

## 2013-11-11 DIAGNOSIS — Z3009 Encounter for other general counseling and advice on contraception: Secondary | ICD-10-CM

## 2013-11-11 DIAGNOSIS — Z01411 Encounter for gynecological examination (general) (routine) with abnormal findings: Secondary | ICD-10-CM | POA: Insufficient documentation

## 2013-11-11 DIAGNOSIS — N941 Dyspareunia: Secondary | ICD-10-CM

## 2013-11-11 DIAGNOSIS — Z23 Encounter for immunization: Secondary | ICD-10-CM

## 2013-11-11 DIAGNOSIS — IMO0002 Reserved for concepts with insufficient information to code with codable children: Secondary | ICD-10-CM

## 2013-11-11 LAB — WET PREP FOR TRICH, YEAST, CLUE
Clue Cells Wet Prep HPF POC: NONE SEEN
Trich, Wet Prep: NONE SEEN

## 2013-11-11 NOTE — Addendum Note (Signed)
Addended by: Nelva Nay on: 11/11/2013 10:34 AM   Modules accepted: Orders

## 2013-11-11 NOTE — Progress Notes (Signed)
Christine Duncan 1980/04/11 [id number]        33 y.o.  [id number] for annual exam.  Several issues noted below.  Past medical history,surgical history, problem list, medications, allergies, family history and social history were all reviewed and documented as reviewed in the EPIC chart.  ROS:  12 system ROS performed with pertinent positives and negatives included in the history, assessment and plan.   Additional significant findings :  none   Exam: [name] Counsellor Vitals:   11/11/13 0940  BP: 116/70  Height: 5\' 9"  (1.753 m)  Weight: 189 lb (85.73 kg)   General appearance:  Normal affect, orientation and appearance. Skin: Grossly normal HEENT: Without gross lesions.  No cervical or supraclavicular adenopathy. Thyroid normal.  Lungs:  Clear without wheezing, rales or rhonchi Cardiac: RR, without RMG Abdominal:  Soft, nontender, without masses, guarding, rebound, organomegaly or hernia Breasts:  Examined lying and sitting without masses, retractions, discharge or axillary adenopathy. Pelvic:  Ext/BUS/vagina normal. Slight white discharge noted  Cervix normal.  Uterus anteverted, normal size, shape and contour, midline and mobile nontender   Adnexa  Without masses or tenderness    Anus and perineum  Normal   Rectovaginal  Normal sphincter tone without palpated masses or tenderness.    Assessment/Plan:  33 y.o. [id number] female for annual exam no menses, continuous oral contraceptives..   1. Contraceptive management. Patient on low-dose oral contraceptives. Doing well using continuously. Does smoke and now has reached the age of 64. I reviewed the increased thrombosis risk associated with estrogen-containing birth control and cigarette smoking in the mid 11s. Recommended that she consider alternative birth control. Reviewed progesterone only like Depo-Provera and Nexplanon as well as IUDs such as [employer] account [employer]. Sterilization was also reviewed. Patient wants to think of her options.  I did refill her 1 year to give her time to decide, the patient understanding her risk of thrombosis and accepts this. 2. Dyspareunia. Patient notes over the last year she's been having dyspareunia. Initially it was with initial insertion which got better over time of intercourse but now she has discomfort throughout the entire course. On exam she does have a vaginismus type response to digital vaginal exam with tightening. Slight white discharge was noted with rare yeast on wet prep. Will cover with Diflucan 150 mg 1. Recommended lubricants to include vegetable oil and self vaginal dilatation. She will follow up if continues to be an issue. 3. Pap/HPV negative 2014. History of LEEP for CIN-2 in 2012. Clear margins noted. Follow up Pap smear 2 showed ASCUS with negative high-risk HPV. Pap smear done today. 4. Breast health. Screening mammographic recommendations between 73 and 40 reviewed. Patient has no strong family history noting a postmenopausal grandmother and no other members and at this point is waiting closer to 52. SBE monthly reviewed. 5. Health maintenance. No routine blood work done and she reports this done at her primary physician's office. Follow up with contraceptive decision otherwise 1 year.    Anastasio Auerbach MD, 10:22 AM 11/11/2013

## 2013-11-11 NOTE — Addendum Note (Signed)
Addended by: Nelva Nay on: 11/11/2013 12:30 PM   Modules accepted: Orders

## 2013-11-11 NOTE — Patient Instructions (Signed)
Follow up with your contraceptive decision.  You may obtain a copy of any labs that were done today by logging onto MyChart as outlined in the instructions provided with your AVS (after visit summary). The office will not call with normal lab results but certainly if there are any significant abnormalities then we will contact you.   Health Maintenance, Female A healthy lifestyle and preventative care can promote health and wellness.  Maintain regular health, dental, and eye exams.  Eat a healthy diet. Foods like vegetables, fruits, whole grains, low-fat dairy products, and lean protein foods contain the nutrients you need without too many calories. Decrease your intake of foods high in solid fats, added sugars, and salt. Get information about a proper diet from your caregiver, if necessary.  Regular physical exercise is one of the most important things you can do for your health. Most adults should get at least 150 minutes of moderate-intensity exercise (any activity that increases your heart rate and causes you to sweat) each week. In addition, most adults need muscle-strengthening exercises on 2 or more days a week.   Maintain a healthy weight. The body mass index (BMI) is a screening tool to identify possible weight problems. It provides an estimate of body fat based on height and weight. Your caregiver can help determine your BMI, and can help you achieve or maintain a healthy weight. For adults 20 years and older:  A BMI below 18.5 is considered underweight.  A BMI of 18.5 to 24.9 is normal.  A BMI of 25 to 29.9 is considered overweight.  A BMI of 30 and above is considered obese.  Maintain normal blood lipids and cholesterol by exercising and minimizing your intake of saturated fat. Eat a balanced diet with plenty of fruits and vegetables. Blood tests for lipids and cholesterol should begin at age 7 and be repeated every 5 years. If your lipid or cholesterol levels are high, you are  over 50, or you are a high risk for heart disease, you may need your cholesterol levels checked more frequently.Ongoing high lipid and cholesterol levels should be treated with medicines if diet and exercise are not effective.  If you smoke, find out from your caregiver how to quit. If you do not use tobacco, do not start.  Lung cancer screening is recommended for adults aged 1 80 years who are at high risk for developing lung cancer because of a history of smoking. Yearly low-dose computed tomography (CT) is recommended for people who have at least a 30-pack-year history of smoking and are a current smoker or have quit within the past 15 years. A pack year of smoking is smoking an average of 1 pack of cigarettes a day for 1 year (for example: 1 pack a day for 30 years or 2 packs a day for 15 years). Yearly screening should continue until the smoker has stopped smoking for at least 15 years. Yearly screening should also be stopped for people who develop a health problem that would prevent them from having lung cancer treatment.  If you are pregnant, do not drink alcohol. If you are breastfeeding, be very cautious about drinking alcohol. If you are not pregnant and choose to drink alcohol, do not exceed 1 drink per day. One drink is considered to be 12 ounces (355 mL) of beer, 5 ounces (148 mL) of wine, or 1.5 ounces (44 mL) of liquor.  Avoid use of street drugs. Do not share needles with anyone. Ask for help if  you need support or instructions about stopping the use of drugs.  High blood pressure causes heart disease and increases the risk of stroke. Blood pressure should be checked at least every 1 to 2 years. Ongoing high blood pressure should be treated with medicines, if weight loss and exercise are not effective.  If you are 46 to 35 years old, ask your caregiver if you should take aspirin to prevent strokes.  Diabetes screening involves taking a blood sample to check your fasting blood sugar  level. This should be done once every 3 years, after age 65, if you are within normal weight and without risk factors for diabetes. Testing should be considered at a younger age or be carried out more frequently if you are overweight and have at least 1 risk factor for diabetes.  Breast cancer screening is essential preventative care for women. You should practice "breast self-awareness." This means understanding the normal appearance and feel of your breasts and may include breast self-examination. Any changes detected, no matter how small, should be reported to a caregiver. Women in their 33s and 30s should have a clinical breast exam (CBE) by a caregiver as part of a regular health exam every 1 to 3 years. After age 43, women should have a CBE every year. Starting at age 45, women should consider having a mammogram (breast X-ray) every year. Women who have a family history of breast cancer should talk to their caregiver about genetic screening. Women at a high risk of breast cancer should talk to their caregiver about having an MRI and a mammogram every year.  Breast cancer gene (BRCA)-related cancer risk assessment is recommended for women who have family members with BRCA-related cancers. BRCA-related cancers include breast, ovarian, tubal, and peritoneal cancers. Having family members with these cancers may be associated with an increased risk for harmful changes (mutations) in the breast cancer genes BRCA1 and BRCA2. Results of the assessment will determine the need for genetic counseling and BRCA1 and BRCA2 testing.  The Pap test is a screening test for cervical cancer. Women should have a Pap test starting at age 68. Between ages 59 and 86, Pap tests should be repeated every 2 years. Beginning at age 28, you should have a Pap test every 3 years as long as the past 3 Pap tests have been normal. If you had a hysterectomy for a problem that was not cancer or a condition that could lead to cancer, then  you no longer need Pap tests. If you are between ages 2 and 25, and you have had normal Pap tests going back 10 years, you no longer need Pap tests. If you have had past treatment for cervical cancer or a condition that could lead to cancer, you need Pap tests and screening for cancer for at least 20 years after your treatment. If Pap tests have been discontinued, risk factors (such as a new sexual partner) need to be reassessed to determine if screening should be resumed. Some women have medical problems that increase the chance of getting cervical cancer. In these cases, your caregiver may recommend more frequent screening and Pap tests.  The human papillomavirus (HPV) test is an additional test that may be used for cervical cancer screening. The HPV test looks for the virus that can cause the cell changes on the cervix. The cells collected during the Pap test can be tested for HPV. The HPV test could be used to screen women aged 20 years and older, and should  be used in women of any age who have unclear Pap test results. After the age of 17, women should have HPV testing at the same frequency as a Pap test.  Colorectal cancer can be detected and often prevented. Most routine colorectal cancer screening begins at the age of 43 and continues through age 15. However, your caregiver may recommend screening at an earlier age if you have risk factors for colon cancer. On a yearly basis, your caregiver may provide home test kits to check for hidden blood in the stool. Use of a small camera at the end of a tube, to directly examine the colon (sigmoidoscopy or colonoscopy), can detect the earliest forms of colorectal cancer. Talk to your caregiver about this at age 16, when routine screening begins. Direct examination of the colon should be repeated every 5 to 10 years through age 31, unless early forms of pre-cancerous polyps or small growths are found.  Hepatitis C blood testing is recommended for all people born  from [birth year] through 1967 and any individual with known risks for hepatitis C.  Practice safe sex. Use condoms and avoid high-risk sexual practices to reduce the spread of sexually transmitted infections (STIs). Sexually active women aged 11 and younger should be checked for Chlamydia, which is a common sexually transmitted infection. Older women with new or multiple partners should also be tested for Chlamydia. Testing for other STIs is recommended if you are sexually active and at increased risk.  Osteoporosis is a disease in which the bones lose minerals and strength with aging. This can result in serious bone fractures. The risk of osteoporosis can be identified using a bone density scan. Women ages 19 and over and women at risk for fractures or osteoporosis should discuss screening with their caregivers. Ask your caregiver whether you should be taking a calcium supplement or vitamin D to reduce the rate of osteoporosis.  Menopause can be associated with physical symptoms and risks. Hormone replacement therapy is available to decrease symptoms and risks. You should talk to your caregiver about whether hormone replacement therapy is right for you.  Use sunscreen. Apply sunscreen liberally and repeatedly throughout the day. You should seek shade when your shadow is shorter than you. Protect yourself by wearing long sleeves, pants, a wide-brimmed hat, and sunglasses year round, whenever you are outdoors.  Notify your caregiver of new moles or changes in moles, especially if there is a change in shape or color. Also notify your caregiver if a mole is larger than the size of a pencil eraser.  Stay current with your immunizations. Document Released: 07/18/2010 Document Revised: 04/29/2012 Document Reviewed: 07/18/2010 [facility] Patient Information 2014 [place].

## 2013-11-12 ENCOUNTER — Telehealth: Payer: Self-pay | Admitting: *Deleted

## 2013-11-12 LAB — CYTOLOGY - PAP

## 2013-11-12 MED ORDER — NORETHIN ACE-ETH ESTRAD-FE 1-20 MG-MCG PO TABS: ORAL_TABLET | ORAL | Status: DC

## 2013-11-12 MED ORDER — FLUCONAZOLE 150 MG PO TABS: 150.0000 mg | ORAL_TABLET | Freq: Once | ORAL | Status: DC

## 2013-11-12 NOTE — Telephone Encounter (Signed)
Pt was seen for annual on 11/11/13  Rx for BCP and diflucan was never sent to pharmacy, Rx will be sent.

## 2013-11-17 ENCOUNTER — Encounter: Payer: Self-pay | Admitting: Gynecology

## 2013-12-04 ENCOUNTER — Encounter: Payer: Self-pay | Admitting: Internal Medicine

## 2013-12-04 ENCOUNTER — Ambulatory Visit (INDEPENDENT_AMBULATORY_CARE_PROVIDER_SITE_OTHER): Payer: 59 | Admitting: Internal Medicine

## 2013-12-04 VITALS — BP 120/84 | HR 76 | Temp 98.5°F | Wt 189.0 lb

## 2013-12-04 DIAGNOSIS — R490 Dysphonia: Secondary | ICD-10-CM | POA: Insufficient documentation

## 2013-12-04 DIAGNOSIS — E538 Deficiency of other specified B group vitamins: Secondary | ICD-10-CM

## 2013-12-04 DIAGNOSIS — M5440 Lumbago with sciatica, unspecified side: Secondary | ICD-10-CM

## 2013-12-04 MED ORDER — CYCLOBENZAPRINE HCL 5 MG PO TABS: 5.0000 mg | ORAL_TABLET | Freq: Three times a day (TID) | ORAL | Status: DC | PRN

## 2013-12-04 NOTE — Assessment & Plan Note (Signed)
2014-15 ?etiology ENT ref

## 2013-12-04 NOTE — Progress Notes (Signed)
Pre visit review using our clinic review tool, if applicable. No additional management support is needed unless otherwise documented below in the visit note. 

## 2013-12-04 NOTE — Assessment & Plan Note (Signed)
Continue with current prescription therapy as reflected on the Med list.  

## 2013-12-04 NOTE — Assessment & Plan Note (Signed)
Flexeril prn  

## 2013-12-04 NOTE — Progress Notes (Signed)
Subjective:  C/o stress - her dad is terminal w/esoph ca F/u bipolar depression  F/u hives -  better F/u FMS - better on Cymbalta  F/u sweats, esp night sweats   Abd pain is better on gluten free    HPI  F/u of complains of overwhelming exhaustion and hypersomnia - better on Cymbalta Fatigue despite sleep - better +snoring most nights - denies OSA Myalgias and stiffness without swelling in joints - affects back and neck - better, but not by much (flexeril helped, had to take 2 Norco a day lately) Takes less klonopin 1-3x/day for tremor symptoms - no other med changes, pain meds - not every day Denies overlap with chronic biploar symptoms  Wt Readings from Last 3 Encounters:  12/04/13 189 lb (85.73 kg)  11/11/13 189 lb (85.73 kg)  10/08/13 187 lb (84.823 kg)   BP Readings from Last 3 Encounters:  12/04/13 120/84  11/11/13 116/70  10/08/13 132/82      Past Medical History  Diagnosis Date  . Blood type, Rh negative   . IBS (irritable bowel syndrome)   . Acid reflux   . Bipolar 1 disorder   . B12 deficiency   . Spastic colon   . Hives   . Essential tremor   . Allergy   . Anemia   . Anxiety   . Depression   . Neuromuscular disorder     fibromylagia  . Fibromyalgia   . Cervical dysplasia     Review of Systems  Constitutional: Positive for fatigue. Negative for unexpected weight change.  HENT: Negative for congestion, dental problem, hearing loss and sore throat.   Respiratory: Negative for shortness of breath.   Cardiovascular: Negative for palpitations and leg swelling.  Gastrointestinal: Negative for nausea, abdominal pain and diarrhea.  Musculoskeletal: Negative for myalgias and joint swelling. Arthralgias: better.  Neurological: Negative for tremors, facial asymmetry, speech difficulty, weakness, light-headedness and headaches.  Hematological: Negative for adenopathy.  Psychiatric/Behavioral: Negative for suicidal ideas, hallucinations, confusion,  sleep disturbance, dysphoric mood, decreased concentration and agitation. The patient is nervous/anxious. The patient is not hyperactive.        Objective:   Physical Exam  Constitutional: She appears well-developed. No distress.  HENT:  Head: Normocephalic.  Right Ear: External ear normal.  Left Ear: External ear normal.  Nose: Nose normal.  Mouth/Throat: Oropharynx is clear and moist.  Eyes: Conjunctivae are normal. Pupils are equal, round, and reactive to light. Right eye exhibits no discharge. Left eye exhibits no discharge.  Neck: Normal range of motion. Neck supple. No JVD present. No tracheal deviation present. No thyromegaly present.  Cardiovascular: Normal rate, regular rhythm and normal heart sounds.   Pulmonary/Chest: No stridor. No respiratory distress. She has no wheezes.  Abdominal: Soft. Bowel sounds are normal. She exhibits no distension and no mass. There is no tenderness. There is no rebound and no guarding.  Musculoskeletal: She exhibits no edema or tenderness.  Lymphadenopathy:    She has no cervical adenopathy.  Neurological: She displays normal reflexes. No cranial nerve deficit. She exhibits normal muscle tone. Coordination normal.  Skin: No rash noted. No erythema.  Psychiatric: She has a normal mood and affect. Her behavior is normal. Judgment and thought content normal.  Sad  BP 120/84 mmHg  Pulse 76  Temp(Src) 98.5 F (36.9 C) (Oral)  Wt 189 lb (85.73 kg)     Lab Results  Component Value Date   WBC 7.1 09/20/2012   HGB 13.7 09/20/2012  HCT 39.9 09/20/2012   PLT 332 09/20/2012   GLUCOSE 83 01/02/2013   CHOL 281* 11/07/2012   TRIG 263* 11/07/2012   HDL 34* 11/07/2012   LDLCALC 194* 11/07/2012   ALT 15 01/02/2013   AST 16 01/02/2013   NA 138 01/02/2013   K 4.2 01/02/2013   CL 104 01/02/2013   CREATININE 0.66 01/02/2013   BUN 9 01/02/2013   CO2 24 01/02/2013   TSH 1.399 01/02/2013       Assessment & Plan:

## 2013-12-05 ENCOUNTER — Telehealth: Payer: Self-pay | Admitting: Internal Medicine

## 2013-12-05 NOTE — Telephone Encounter (Signed)
emmi emailed °

## 2013-12-10 ENCOUNTER — Encounter: Payer: Self-pay | Admitting: Internal Medicine

## 2013-12-10 ENCOUNTER — Ambulatory Visit: Payer: Self-pay | Admitting: Internal Medicine

## 2013-12-10 ENCOUNTER — Ambulatory Visit (INDEPENDENT_AMBULATORY_CARE_PROVIDER_SITE_OTHER): Payer: 59 | Admitting: Internal Medicine

## 2013-12-10 VITALS — BP 140/88 | HR 101 | Temp 98.8°F | Ht 69.0 in | Wt 188.8 lb

## 2013-12-10 DIAGNOSIS — B029 Zoster without complications: Secondary | ICD-10-CM | POA: Insufficient documentation

## 2013-12-10 DIAGNOSIS — I10 Essential (primary) hypertension: Secondary | ICD-10-CM

## 2013-12-10 MED ORDER — VALACYCLOVIR HCL 1 G PO TABS: 1000.0000 mg | ORAL_TABLET | Freq: Three times a day (TID) | ORAL | Status: DC

## 2013-12-10 NOTE — Progress Notes (Signed)
Subjective:    Patient ID: Christine Duncan, female    DOB: 1980/04/09, 33 y.o.   MRN: [id number]  HPI  Here with 2 days onset severe left flank pain and rash, after pain in the area for 2 days prior to that.  No fever or drainage, has hx of chickenpox as child, no overt recent stressors such as infection or other. Pt denies chest pain, increased sob or doe, wheezing, orthopnea, PND, increased LE swelling, palpitations, dizziness or syncope.  Pt denies new neurological symptoms such as new headache, or facial or extremity weakness or numbness   Pt denies polydipsia, polyuria, Past Medical History  Diagnosis Date  . Blood type, Rh negative   . IBS (irritable bowel syndrome)   . Acid reflux   . Bipolar 1 disorder   . B12 deficiency   . Spastic colon   . Hives   . Essential tremor   . Allergy   . Anemia   . Anxiety   . Depression   . Neuromuscular disorder     fibromylagia  . Fibromyalgia   . Cervical dysplasia    Past Surgical History  Procedure Laterality Date  . Dilation and curettage of uterus  2005  . Cesarean section  2006  . Cesarean section  2011  . 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 Colposcopy      reports that she has been smoking Cigarettes.  She has been smoking about 0.75 packs per day. She has never used smokeless tobacco. She reports that she drinks alcohol. She reports that she does not use illicit drugs. family history includes Alcohol abuse in her maternal grandfather; Bipolar disorder in her maternal grandfather and paternal grandmother; Breast cancer (age of onset: 74) in her paternal grandmother; Cancer in her paternal grandmother; Cancer (age of onset: 2) in her father; Colon cancer in her paternal grandmother; Depression in her maternal grandmother and mother; Diabetes in her paternal grandmother; Drug abuse in her maternal grandfather; Esophageal cancer in her father; Hyperlipidemia in her paternal grandmother; Seizures in her maternal grandmother; Thyroid  disease in her maternal grandmother and mother. There is no history of Rectal cancer or Stomach cancer. Allergies  Allergen Reactions  . Carbidopa-Levodopa     REACTION: nausea  . Sertraline Hcl     REACTION: Mania   Current Outpatient Prescriptions on File Prior to Visit  Medication Sig Dispense Refill  . Cholecalciferol (VITAMIN D PO) Take by mouth.    . clonazePAM (KLONOPIN) 1 MG disintegrating tablet Take 1 tablet (1 mg total) by mouth 3 (three) times daily as needed. 90 tablet 3  . Cyanocobalamin 1000 MCG SUBL Place 1 tablet (1,000 mcg total) under the tongue daily. 100 tablet 3  . cyclobenzaprine (FLEXERIL) 5 MG tablet Take 1 tablet (5 mg total) by mouth 3 (three) times daily as needed for muscle spasms. 270 tablet 2  . gabapentin (NEURONTIN) 600 MG tablet Take 600 mg by mouth 3 (three) times daily.    Marland Kitchen HYDROcodone-acetaminophen (NORCO) 7.5-325 MG per tablet Take 1 tablet by mouth 4 (four) times daily as needed. 60 tablet 0  . hydrOXYzine (ATARAX/VISTARIL) 25 MG tablet Take 1 tablet (25 mg total) by mouth every 8 (eight) hours as needed for itching. 60 tablet 3  . L-Methylfolate-Algae (DEPLIN 15) 15-90.314 MG CAPS Take 1 capsule by mouth every morning. 30 capsule 3  . loratadine (CLARITIN) 10 MG tablet Take 1 tablet (10 mg total) by mouth daily. 100  tablet 3  . norethindrone-ethinyl estradiol (JUNEL FE 1/20) 1-20 MG-MCG tablet Take one tablet by mouth one time daily 84 Package 4  . omeprazole (PRILOSEC) 20 MG capsule Take 2 capsules by mouth daily. Please disregard last RX sent over signed by Dr Linda Hedges 180 capsule 3  . ranitidine (ZANTAC) 150 MG tablet Take 1 tablet (150 mg total) by mouth 2 (two) times daily. 60 tablet 11   No current facility-administered medications on file prior to visit.    Review of Systems All otherwise neg per pt     Objective:   Physical Exam BP 140/88 mmHg  Pulse 101  Temp(Src) 98.8 F (37.1 C) (Oral)  Ht 5\' 9"  (1.753 m)  Wt 188 lb 12 oz (85.616  kg)  BMI 27.86 kg/m2  SpO2 98% VS noted,  Constitutional: Pt appears well-developed, well-nourished.  HENT: Head: NCAT.  Right Ear: External ear normal.  Left Ear: External ear normal.  Eyes: . Pupils are equal, round, and reactive to light. Conjunctivae and EOM are normal Neck: Normal range of motion. Neck supple.  Cardiovascular: Normal rate and regular rhythm.   Pulmonary/Chest: Effort normal and breath sounds normal.  Abd:  Soft, NT, ND, + BS Neurological: Pt is alert. Not confused , motor grossly intact Skin: Skin is warm. Left flank with 3 cm area tender grouped vesicles on erythem base Psychiatric: Pt behavior is normal. No agitation.      Assessment & Plan:

## 2013-12-10 NOTE — Assessment & Plan Note (Signed)
Mild rash but subjective severe pain; for vicodin prn limited rx, and valtrex course, avoid younger children or adults without hx of chickenpox or varicella vaccination

## 2013-12-10 NOTE — Assessment & Plan Note (Signed)
stable overall by history and exam, recent data reviewed with pt, and pt to continue medical treatment as before,  to f/u any worsening symptoms or concerns BP Readings from Last 3 Encounters:  12/10/13 140/88  12/04/13 120/84  11/11/13 116/70

## 2013-12-10 NOTE — Patient Instructions (Signed)
Please take all new medication as prescribed  Please continue all other medications as before, and refills have been done if requested.  Please have the pharmacy call with any other refills you may need.  Please keep your appointments with your specialists as you may have planned     

## 2013-12-10 NOTE — Progress Notes (Signed)
Pre visit review using our clinic review tool, if applicable. No additional management support is needed unless otherwise documented below in the visit note. 

## 2014-01-07 ENCOUNTER — Ambulatory Visit: Payer: Self-pay | Admitting: Internal Medicine

## 2014-01-08 ENCOUNTER — Ambulatory Visit: Payer: Self-pay | Admitting: Internal Medicine

## 2014-04-16 ENCOUNTER — Encounter: Payer: Self-pay | Admitting: Internal Medicine

## 2014-04-23 ENCOUNTER — Encounter: Payer: Self-pay | Admitting: Internal Medicine

## 2014-05-06 DIAGNOSIS — Z0279 Encounter for issue of other medical certificate: Secondary | ICD-10-CM

## 2014-07-26 ENCOUNTER — Emergency Department (HOSPITAL_BASED_OUTPATIENT_CLINIC_OR_DEPARTMENT_OTHER)
Admission: EM | Admit: 2014-07-26 | Discharge: 2014-07-27 | Disposition: A | Discharge status: Home / Self Care | Payer: 59 | Attending: Emergency Medicine | Admitting: Emergency Medicine

## 2014-07-26 ENCOUNTER — Encounter (HOSPITAL_BASED_OUTPATIENT_CLINIC_OR_DEPARTMENT_OTHER): Payer: Self-pay | Admitting: Emergency Medicine

## 2014-07-26 ENCOUNTER — Emergency Department (HOSPITAL_BASED_OUTPATIENT_CLINIC_OR_DEPARTMENT_OTHER): Payer: 59

## 2014-07-26 DIAGNOSIS — Z793 Long term (current) use of hormonal contraceptives: Secondary | ICD-10-CM | POA: Diagnosis not present

## 2014-07-26 DIAGNOSIS — K115 Sialolithiasis: Secondary | ICD-10-CM | POA: Insufficient documentation

## 2014-07-26 DIAGNOSIS — Z872 Personal history of diseases of the skin and subcutaneous tissue: Secondary | ICD-10-CM | POA: Diagnosis not present

## 2014-07-26 DIAGNOSIS — F319 Bipolar disorder, unspecified: Secondary | ICD-10-CM | POA: Insufficient documentation

## 2014-07-26 DIAGNOSIS — K219 Gastro-esophageal reflux disease without esophagitis: Secondary | ICD-10-CM | POA: Diagnosis not present

## 2014-07-26 DIAGNOSIS — E876 Hypokalemia: Secondary | ICD-10-CM

## 2014-07-26 DIAGNOSIS — Z3202 Encounter for pregnancy test, result negative: Secondary | ICD-10-CM | POA: Insufficient documentation

## 2014-07-26 DIAGNOSIS — F419 Anxiety disorder, unspecified: Secondary | ICD-10-CM | POA: Diagnosis not present

## 2014-07-26 DIAGNOSIS — R52 Pain, unspecified: Secondary | ICD-10-CM

## 2014-07-26 DIAGNOSIS — M797 Fibromyalgia: Secondary | ICD-10-CM | POA: Insufficient documentation

## 2014-07-26 DIAGNOSIS — Z720 Tobacco use: Secondary | ICD-10-CM | POA: Diagnosis not present

## 2014-07-26 DIAGNOSIS — Z8742 Personal history of other diseases of the female genital tract: Secondary | ICD-10-CM | POA: Insufficient documentation

## 2014-07-26 DIAGNOSIS — Z79899 Other long term (current) drug therapy: Secondary | ICD-10-CM | POA: Diagnosis not present

## 2014-07-26 DIAGNOSIS — Z862 Personal history of diseases of the blood and blood-forming organs and certain disorders involving the immune mechanism: Secondary | ICD-10-CM | POA: Diagnosis not present

## 2014-07-26 DIAGNOSIS — R6884 Jaw pain: Secondary | ICD-10-CM | POA: Diagnosis present

## 2014-07-26 DIAGNOSIS — G709 Myoneural disorder, unspecified: Secondary | ICD-10-CM | POA: Diagnosis not present

## 2014-07-26 LAB — CBC WITH DIFFERENTIAL/PLATELET
BASOS ABS: 0 10*3/uL (ref 0.0–0.1)
BASOS PCT: 0 % (ref 0–1)
EOS ABS: 0.1 10*3/uL (ref 0.0–0.7)
EOS PCT: 1 % (ref 0–5)
HEMATOCRIT: 37.1 % (ref 36.0–46.0)
Hemoglobin: 12.9 g/dL (ref 12.0–15.0)
Lymphocytes Relative: 52 % — ABNORMAL HIGH (ref 12–46)
Lymphs Abs: 3.6 10*3/uL (ref 0.7–4.0)
MCH: 29.6 pg (ref 26.0–34.0)
MCHC: 34.8 g/dL (ref 30.0–36.0)
MCV: 85.1 fL (ref 78.0–100.0)
MONO ABS: 0.5 10*3/uL (ref 0.1–1.0)
Monocytes Relative: 7 % (ref 3–12)
Neutro Abs: 2.9 10*3/uL (ref 1.7–7.7)
Neutrophils Relative %: 40 % — ABNORMAL LOW (ref 43–77)
PLATELETS: 329 10*3/uL (ref 150–400)
RBC: 4.36 MIL/uL (ref 3.87–5.11)
RDW: 12.9 % (ref 11.5–15.5)
WBC: 7.1 10*3/uL (ref 4.0–10.5)

## 2014-07-26 LAB — PREGNANCY, URINE: PREG TEST UR: NEGATIVE

## 2014-07-26 LAB — BASIC METABOLIC PANEL
Anion gap: 10 (ref 5–15)
BUN: 7 mg/dL (ref 6–20)
CO2: 19 mmol/L — ABNORMAL LOW (ref 22–32)
CREATININE: 0.93 mg/dL (ref 0.44–1.00)
Calcium: 8.8 mg/dL — ABNORMAL LOW (ref 8.9–10.3)
Chloride: 107 mmol/L (ref 101–111)
Glucose, Bld: 105 mg/dL — ABNORMAL HIGH (ref 65–99)
Potassium: 2.6 mmol/L — CL (ref 3.5–5.1)
Sodium: 136 mmol/L (ref 135–145)

## 2014-07-26 MED ORDER — IBUPROFEN 400 MG PO TABS
400.0000 mg | ORAL_TABLET | Freq: Once | ORAL | Status: AC
  Administered 2014-07-26: 400 mg via ORAL
  Filled 2014-07-26: qty 1

## 2014-07-26 MED ORDER — POTASSIUM CHLORIDE 20 MEQ/15ML (10%) PO SOLN
40.0000 meq | Freq: Once | ORAL | Status: AC
  Administered 2014-07-26: 40 meq via ORAL
  Filled 2014-07-26: qty 30

## 2014-07-26 MED ORDER — ACETAMINOPHEN 325 MG PO TABS
650.0000 mg | ORAL_TABLET | Freq: Once | ORAL | Status: AC
  Administered 2014-07-26: 650 mg via ORAL
  Filled 2014-07-26: qty 2

## 2014-07-26 MED ORDER — IBUPROFEN 800 MG PO TABS: 800.0000 mg | ORAL_TABLET | Freq: Three times a day (TID) | ORAL | Status: DC

## 2014-07-26 MED ORDER — SODIUM CHLORIDE 0.9 % IV BOLUS (SEPSIS)
1000.0000 mL | Freq: Once | INTRAVENOUS | Status: AC
  Administered 2014-07-26: 1000 mL via INTRAVENOUS

## 2014-07-26 MED ORDER — IOHEXOL 300 MG/ML  SOLN
75.0000 mL | Freq: Once | INTRAMUSCULAR | Status: AC | PRN
  Administered 2014-07-26: 75 mL via INTRAVENOUS

## 2014-07-26 MED ORDER — POTASSIUM CHLORIDE CRYS ER 20 MEQ PO TBCR
40.0000 meq | EXTENDED_RELEASE_TABLET | Freq: Once | ORAL | Status: DC
  Filled 2014-07-26: qty 2

## 2014-07-26 MED ORDER — POTASSIUM CHLORIDE 10 MEQ/100ML IV SOLN
10.0000 meq | Freq: Once | INTRAVENOUS | Status: AC
  Administered 2014-07-26: 10 meq via INTRAVENOUS
  Filled 2014-07-26: qty 100

## 2014-07-26 MED ORDER — POTASSIUM CHLORIDE CRYS ER 20 MEQ PO TBCR: 20.0000 meq | EXTENDED_RELEASE_TABLET | Freq: Every day | ORAL | Status: DC

## 2014-07-26 NOTE — ED Notes (Signed)
Urine sample collected by EMT Elonda Husky

## 2014-07-26 NOTE — ED Notes (Signed)
Pt in c/o R lower jaw pain. States worse when chewing food, feels like her R neck under jaw line in swelling. No abnormality noted in mouth or tongue, slight asymmetry noted to neck.

## 2014-07-26 NOTE — Discharge Instructions (Signed)
Use sour candies like lemon drops frequently. Massage the affected swollen area to help express the stone. Follow with the Ear nose and throat doctor if you have any persistent symptoms. If anything changes or worsens return back to the emergency room,  call your primary care doctor let them know they were seen at the emergency room. They will have to obtain records for further outpatient management.

## 2014-07-26 NOTE — ED Provider Notes (Signed)
CSN: [id number]     Arrival date & time 07/26/14  2009 History   First MD Initiated Contact with Patient 07/26/14 2019     Chief Complaint  Patient presents with  . Jaw Pain     (Consider location/radiation/quality/duration/timing/severity/associated sxs/prior Treatment) HPI   Blood pressure 127/77, pulse 96, temperature 98.2 F (36.8 C), temperature source Oral, resp. rate 18, height 5\' 8"  (1.727 m), weight 177 lb (80.287 kg), SpO2 100 %.  Christine Duncan is a 34 y.o. female complaining of pain underneath tongue which she noticed this morning but exacerbated by swallowing. States that the pain sometimes radiates to the ear into the nose. She also feels that her right throat is swollen. Patient denies dental issues, fever, chills, nausea, vomiting, rhinorrhea, cough, shortness of breath.  Past Medical History  Diagnosis Date  . Blood type, Rh negative   . IBS (irritable bowel syndrome)   . Acid reflux   . Bipolar 1 disorder   . B12 deficiency   . Spastic colon   . Hives   . Essential tremor   . Allergy   . Anemia   . Anxiety   . Depression   . Neuromuscular disorder     fibromylagia  . Fibromyalgia   . Cervical dysplasia    Past Surgical History  Procedure Laterality Date  . Dilation and curettage of uterus  2005  . Cesarean section  2006  . Cesarean section  2011  . 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 Colposcopy     Family History  Problem Relation Age of Onset  . Thyroid disease Mother   . Depression Mother   . Cancer Father 58    esoph ca  . Esophageal cancer Father   . Seizures Maternal Grandmother   . Thyroid disease Maternal Grandmother   . Depression Maternal Grandmother   . Hyperlipidemia Paternal Grandmother   . Diabetes Paternal Grandmother   . Breast cancer Paternal Grandmother 22  . Cancer Paternal Grandmother     Bladder and colon cancer  . Colon cancer Paternal Grandmother   . Bipolar disorder Paternal Grandmother   . Rectal cancer Neg  Hx   . Stomach cancer Neg Hx   . Bipolar disorder Maternal Grandfather   . Alcohol abuse Maternal Grandfather   . Drug abuse Maternal Grandfather    History  Substance Use Topics  . Smoking status: Current Every Day Smoker -- 0.75 packs/day    Types: Cigarettes  . Smokeless tobacco: Never Used  . Alcohol Use: Yes     Comment: Rare   OB History    Gravida Para Term Preterm AB TAB SAB Ectopic Multiple Living   5 2 2  3  3   2      Review of Systems  10 systems reviewed and found to be negative, except as noted in the HPI.  Allergies  Carbidopa-levodopa and Sertraline hcl  Home Medications   Prior to Admission medications   Medication Sig Start Date End Date Taking? Authorizing Provider  phentermine 37.5 MG capsule Take 37.5 mg by mouth every morning.   Yes Historical Provider, MD  topiramate (TOPAMAX) 100 MG tablet Take 100 mg by mouth daily.   Yes Historical Provider, MD  Cholecalciferol (VITAMIN D PO) Take by mouth.    Historical Provider, MD  clonazePAM (KLONOPIN) 1 MG disintegrating tablet Take 1 tablet (1 mg total) by mouth 3 (three) times daily as needed. 11/27/12   Aleksei Plotnikov V,  MD  Cyanocobalamin 1000 MCG SUBL Place 1 tablet (1,000 mcg total) under the tongue daily. 04/30/12   Aleksei Plotnikov V, MD  cyclobenzaprine (FLEXERIL) 5 MG tablet Take 1 tablet (5 mg total) by mouth 3 (three) times daily as needed for muscle spasms. 12/04/13   Aleksei Plotnikov V, MD  gabapentin (NEURONTIN) 600 MG tablet Take 600 mg by mouth 3 (three) times daily.    Historical Provider, MD  HYDROcodone-acetaminophen (NORCO) 7.5-325 MG per tablet Take 1 tablet by mouth 4 (four) times daily as needed. 08/27/13   Hendricks Limes, MD  hydrOXYzine (ATARAX/VISTARIL) 25 MG tablet Take 1 tablet (25 mg total) by mouth every 8 (eight) hours as needed for itching. 11/07/12   Aleksei Plotnikov V, MD  ibuprofen (ADVIL,MOTRIN) 800 MG tablet Take 1 tablet (800 mg total) by mouth 3 (three) times daily.  07/26/14   Rhilyn Battle, PA-C  L-Methylfolate-Algae (DEPLIN 15) 15-90.314 MG CAPS Take 1 capsule by mouth every morning. 11/27/12   Aleksei Plotnikov V, MD  loratadine (CLARITIN) 10 MG tablet Take 1 tablet (10 mg total) by mouth daily. 08/23/12   Cassandria Anger, MD  norethindrone-ethinyl estradiol (JUNEL FE 1/20) 1-20 MG-MCG tablet Take one tablet by mouth one time daily 11/12/13   Anastasio Auerbach, MD  omeprazole (PRILOSEC) 20 MG capsule Take 2 capsules by mouth daily. Please disregard last RX sent over signed by Dr Linda Hedges 02/27/13   Lew Dawes V, MD  potassium chloride SA (K-DUR,KLOR-CON) 20 MEQ tablet Take 1 tablet (20 mEq total) by mouth daily. 07/26/14   Marq Rebello, PA-C  ranitidine (ZANTAC) 150 MG tablet Take 1 tablet (150 mg total) by mouth 2 (two) times daily. 04/10/13   Aleksei Plotnikov V, MD  valACYclovir (VALTREX) 1000 MG tablet Take 1 tablet (1,000 mg total) by mouth 3 (three) times daily. 12/10/13   Biagio Borg, MD   BP 112/74 mmHg  Pulse 79  Temp(Src) 98 F (36.7 C) (Oral)  Resp 20  Ht 5\' 8"  (1.727 m)  Wt 177 lb (80.287 kg)  BMI 26.92 kg/m2  SpO2 99% Physical Exam  HENT:  Posterior pharynx with no erythema, no tonsillar hypertrophy, uvula is midline, soft palate rises symmetrically, there is no elevation in the tongue however patient is very tender to palpation underneath the right side of her tongue, the right side of the exterior throat is swollen and diffusely tender to palpation, no focal lymphadenopathy appreciated.    ED Course  Procedures (including critical care time) Labs Review Labs Reviewed  CBC WITH DIFFERENTIAL/PLATELET - Abnormal; Notable for the following:    Neutrophils Relative % 40 (*)    Lymphocytes Relative 52 (*)    All other components within normal limits  BASIC METABOLIC PANEL - Abnormal; Notable for the following:    Potassium 2.6 (*)    CO2 19 (*)    Glucose, Bld 105 (*)    Calcium 8.8 (*)    All other components within  normal limits  PREGNANCY, URINE    Imaging Review Ct Soft Tissue Neck W Contrast  07/26/2014   CLINICAL DATA:  34 year old female with right lower jaw pain Check  EXAM: CT NECK WITH CONTRAST  TECHNIQUE: Multidetector CT imaging of the neck was performed using the standard protocol following the bolus administration of intravenous contrast.  CONTRAST:  49mL OMNIPAQUE IOHEXOL 300 MG/ML  SOLN  COMPARISON:  None.  FINDINGS: There is a 3 mm hypoattenuating focus along the floor of the mouth and medial to  the right mandible (series 2, image 47) compatible with a salivary gland duct stone. There is edema and increased enhancement of the right submandibular gland with surrounding stranding. The left submandibular gland, and the parotid glands appear unremarkable. There is heterogeneous appearance of the right lobe of the thyroid with multiple hypodense nodules measuring up to 1 cm. Ultrasound is recommended for better evaluation of thyroid.  The visualized upper lungs are clear. The central airways are patent. The paranasal sinuses and mastoid air cells are clear. There is reversal of the normal cervical lordosis which may be positional or due to muscle spasm. No acute fracture. The visualized aortic arch, carotid, and vertebral arteries appear unremarkable. The visualized brain is unremarkable. The orbits and retro-orbital fat are preserved. No significant lymphadenopathy.  IMPRESSION: A 3 mm right submandibular gland duct calculus with inflammatory changes of the right submandibular gland compatible with sialadenitis. No drainable fluid collection/ abscess.   Electronically Signed   By: Anner Crete M.D.   On: 07/26/2014 22:09     EKG Interpretation None      MDM   Final diagnoses:  Pain  Sialolithiasis  Hypokalemia    Filed Vitals:   07/26/14 2017 07/26/14 2200 07/26/14 2313  BP: 127/77  112/74  Pulse: 96  79  Temp: 98.2 F (36.8 C) 98 F (36.7 C)   TempSrc: Oral Oral   Resp: 18  20   Height: 5\' 8"  (1.727 m)    Weight: 177 lb (80.287 kg)    SpO2: 100%  99%    Medications  sodium chloride 0.9 % bolus 1,000 mL (1,000 mLs Intravenous New Bag/Given 07/26/14 2054)  ibuprofen (ADVIL,MOTRIN) tablet 400 mg (400 mg Oral Given 07/26/14 2118)  acetaminophen (TYLENOL) tablet 650 mg (650 mg Oral Given 07/26/14 2118)  potassium chloride 10 mEq in 100 mL IVPB (10 mEq Intravenous New Bag/Given 07/26/14 2227)  iohexol (OMNIPAQUE) 300 MG/ML solution 75 mL (75 mLs Intravenous Contrast Given 07/26/14 2139)  sodium chloride 0.9 % bolus 1,000 mL (1,000 mLs Intravenous New Bag/Given 07/26/14 2227)  potassium chloride 20 MEQ/15ML (10%) solution 40 mEq (40 mEq Oral Given 07/26/14 2226)    Fleeta Emmer Gens is a pleasant 34 y.o. female presenting with local pain underneath the right side of her tongue associated with right-sided neck swelling and exacerbation when patient swallows. Concern for deep tissue infection. Patient will be bolused, basic blood work and CT neck ordered.  Patient's potassium is low at 2.6. Patient denies any nausea vomiting diarrhea, she's not taking any diarrhea tics. Eating and drinking normally. Her CO2 is slightly decreased in 19 as well. No leukocytosis. Patient will be given a run of potassium through the IV and then also orally.  CT shows a 3 mm right submandibular gland duct calculus with inflammation no overt signs of infection/abscess.  Advised patient to have sour candies, 3 mm may be passible w/o intervention, will give her referral to ENT.  Evaluation does not show pathology that would require ongoing emergent intervention or inpatient treatment. Pt is hemodynamically stable and mentating appropriately. Discussed findings and plan with patient/guardian, who agrees with care plan. All questions answered. Return precautions discussed and outpatient follow up given.   New Prescriptions   IBUPROFEN (ADVIL,MOTRIN) 800 MG TABLET    Take 1 tablet (800 mg total) by mouth 3  (three) times daily.   POTASSIUM CHLORIDE SA (K-DUR,KLOR-CON) 20 MEQ TABLET    Take 1 tablet (20 mEq total) by mouth daily.  Monico Blitz, PA-C 07/26/14 2641  Pattricia Boss, MD 07/26/14 [phone]

## 2014-07-26 NOTE — ED Notes (Signed)
PA made aware of critical K level of 2.6

## 2014-07-29 ENCOUNTER — Ambulatory Visit (INDEPENDENT_AMBULATORY_CARE_PROVIDER_SITE_OTHER): Payer: 59 | Admitting: Internal Medicine

## 2014-07-29 ENCOUNTER — Other Ambulatory Visit (INDEPENDENT_AMBULATORY_CARE_PROVIDER_SITE_OTHER): Payer: 59

## 2014-07-29 ENCOUNTER — Encounter: Payer: Self-pay | Admitting: Internal Medicine

## 2014-07-29 VITALS — BP 120/80 | HR 97 | Wt 181.0 lb

## 2014-07-29 DIAGNOSIS — K1120 Sialoadenitis, unspecified: Secondary | ICD-10-CM | POA: Diagnosis not present

## 2014-07-29 DIAGNOSIS — B079 Viral wart, unspecified: Secondary | ICD-10-CM | POA: Diagnosis not present

## 2014-07-29 DIAGNOSIS — E876 Hypokalemia: Secondary | ICD-10-CM

## 2014-07-29 DIAGNOSIS — E538 Deficiency of other specified B group vitamins: Secondary | ICD-10-CM

## 2014-07-29 LAB — BASIC METABOLIC PANEL
BUN: 5 mg/dL — AB (ref 6–23)
CO2: 19 meq/L (ref 19–32)
CREATININE: 0.83 mg/dL (ref 0.40–1.20)
Calcium: 8.8 mg/dL (ref 8.4–10.5)
Chloride: 113 mEq/L — ABNORMAL HIGH (ref 96–112)
GFR: 82.63 mL/min (ref >60.00)
GLUCOSE: 103 mg/dL — AB (ref 70–99)
Potassium: 3.8 mEq/L (ref 3.5–5.1)
Sodium: 140 mEq/L (ref 135–145)

## 2014-07-29 LAB — MAGNESIUM: Magnesium: 2 mg/dL (ref 1.5–2.5)

## 2014-07-29 LAB — VITAMIN B12: VITAMIN B 12: 256 pg/mL (ref 211–911)

## 2014-07-29 MED ORDER — POTASSIUM CHLORIDE CRYS ER 20 MEQ PO TBCR: 20.0000 meq | EXTENDED_RELEASE_TABLET | Freq: Every day | ORAL | Status: DC

## 2014-07-29 MED ORDER — ALIGN 4 MG PO CAPS: 1.0000 | ORAL_CAPSULE | Freq: Every day | ORAL | Status: DC

## 2014-07-29 NOTE — Assessment & Plan Note (Signed)
Labs Kcl

## 2014-07-29 NOTE — Progress Notes (Signed)
Subjective:  Patient ID: Christine Duncan, female    DOB: July 28, 1980  Age: 34 y.o. MRN: [id number]  CC: No chief complaint on file.   HPI Christine Duncan presents for R sialoadenitis/stone - -?passed the stone; ER gave abx. Her K was low (hot week at the beach). F/u IBS-D.  Outpatient Prescriptions Prior to Visit  Medication Sig Dispense Refill  . clonazePAM (KLONOPIN) 1 MG disintegrating tablet Take 1 tablet (1 mg total) by mouth 3 (three) times daily as needed. 90 tablet 3  . cyclobenzaprine (FLEXERIL) 5 MG tablet Take 1 tablet (5 mg total) by mouth 3 (three) times daily as needed for muscle spasms. 270 tablet 2  . HYDROcodone-acetaminophen (NORCO) 7.5-325 MG per tablet Take 1 tablet by mouth 4 (four) times daily as needed. 60 tablet 0  . hydrOXYzine (ATARAX/VISTARIL) 25 MG tablet Take 1 tablet (25 mg total) by mouth every 8 (eight) hours as needed for itching. 60 tablet 3  . ibuprofen (ADVIL,MOTRIN) 800 MG tablet Take 1 tablet (800 mg total) by mouth 3 (three) times daily. 21 tablet 0  . L-Methylfolate-Algae (DEPLIN 15) 15-90.314 MG CAPS Take 1 capsule by mouth every morning. 30 capsule 3  . loratadine (CLARITIN) 10 MG tablet Take 1 tablet (10 mg total) by mouth daily. 100 tablet 3  . norethindrone-ethinyl estradiol (JUNEL FE 1/20) 1-20 MG-MCG tablet Take one tablet by mouth one time daily 84 Package 4  . omeprazole (PRILOSEC) 20 MG capsule Take 2 capsules by mouth daily. Please disregard last RX sent over signed by Dr Linda Hedges 180 capsule 3  . topiramate (TOPAMAX) 100 MG tablet Take 100 mg by mouth daily.    Marland Kitchen gabapentin (NEURONTIN) 600 MG tablet Take 600 mg by mouth 3 (three) times daily.    . phentermine 37.5 MG capsule Take 37.5 mg by mouth every morning.    . potassium chloride SA (K-DUR,KLOR-CON) 20 MEQ tablet Take 1 tablet (20 mEq total) by mouth daily. 3 tablet 0  . Cholecalciferol (VITAMIN D PO) Take by mouth.    . Cyanocobalamin 1000 MCG SUBL Place 1 tablet (1,000 mcg total) under  the tongue daily. (Patient not taking: Reported on 07/29/2014) 100 tablet 3  . ranitidine (ZANTAC) 150 MG tablet Take 1 tablet (150 mg total) by mouth 2 (two) times daily. (Patient not taking: Reported on 07/29/2014) 60 tablet 11  . valACYclovir (VALTREX) 1000 MG tablet Take 1 tablet (1,000 mg total) by mouth 3 (three) times daily. (Patient not taking: Reported on 07/29/2014) 21 tablet 0   No facility-administered medications prior to visit.    ROS Review of Systems  Constitutional: Negative for chills, activity change, appetite change, fatigue and unexpected weight change.  HENT: Negative for congestion, mouth sores and sinus pressure.   Eyes: Negative for visual disturbance.  Respiratory: Negative for cough and chest tightness.   Gastrointestinal: Positive for abdominal pain and diarrhea. Negative for nausea.  Genitourinary: Negative for frequency, difficulty urinating and vaginal pain.  Musculoskeletal: Negative for back pain and gait problem.  Skin: Negative for pallor and rash.  Neurological: Negative for dizziness, tremors, weakness, numbness and headaches.  Psychiatric/Behavioral: Negative for suicidal ideas, confusion and sleep disturbance.    Objective:  BP 120/80 mmHg  Pulse 97  Wt 181 lb (82.101 kg)  SpO2 98%  BP Readings from Last 3 Encounters:  07/29/14 120/80  07/27/14 101/59  12/10/13 140/88    Wt Readings from Last 3 Encounters:  07/29/14 181 lb (82.101 kg)  07/26/14 177  lb (80.287 kg)  12/10/13 188 lb 12 oz (85.616 kg)    Physical Exam  Constitutional: She appears well-developed. No distress.  HENT:  Head: Normocephalic.  Right Ear: External ear normal.  Left Ear: External ear normal.  Nose: Nose normal.  Mouth/Throat: Oropharynx is clear and moist.  Eyes: Conjunctivae are normal. Pupils are equal, round, and reactive to light. Right eye exhibits no discharge. Left eye exhibits no discharge.  Neck: Normal range of motion. Neck supple. No JVD present. No  tracheal deviation present. No thyromegaly present.  Cardiovascular: Normal rate, regular rhythm and normal heart sounds.   Pulmonary/Chest: No stridor. No respiratory distress. She has no wheezes.  Abdominal: Soft. Bowel sounds are normal. She exhibits no distension and no mass. There is no tenderness. There is no rebound and no guarding.  Musculoskeletal: She exhibits no edema or tenderness.  Lymphadenopathy:    She has no cervical adenopathy.  Neurological: She displays normal reflexes. No cranial nerve deficit. She exhibits normal muscle tone. Coordination normal.  Skin: No rash noted. No erythema.  Psychiatric: She has a normal mood and affect. Her behavior is normal. Judgment and thought content normal.  Warts RLE  Lab Results  Component Value Date   WBC 7.1 07/26/2014   HGB 12.9 07/26/2014   HCT 37.1 07/26/2014   PLT 329 07/26/2014   GLUCOSE 103* 07/29/2014   CHOL 281* 11/07/2012   TRIG 263* 11/07/2012   HDL 34* 11/07/2012   LDLCALC 194* 11/07/2012   ALT 15 01/02/2013   AST 16 01/02/2013   NA 140 07/29/2014   K 3.8 07/29/2014   CL 113* 07/29/2014   CREATININE 0.83 07/29/2014   BUN 5* 07/29/2014   CO2 19 07/29/2014   TSH 1.399 01/02/2013    Ct Soft Tissue Neck W Contrast  07/26/2014   CLINICAL DATA:  34 year old female with right lower jaw pain Check  EXAM: CT NECK WITH CONTRAST  TECHNIQUE: Multidetector CT imaging of the neck was performed using the standard protocol following the bolus administration of intravenous contrast.  CONTRAST:  80mL OMNIPAQUE IOHEXOL 300 MG/ML  SOLN  COMPARISON:  None.  FINDINGS: There is a 3 mm hypoattenuating focus along the floor of the mouth and medial to the right mandible (series 2, image 47) compatible with a salivary gland duct stone. There is edema and increased enhancement of the right submandibular gland with surrounding stranding. The left submandibular gland, and the parotid glands appear unremarkable. There is heterogeneous appearance  of the right lobe of the thyroid with multiple hypodense nodules measuring up to 1 cm. Ultrasound is recommended for better evaluation of thyroid.  The visualized upper lungs are clear. The central airways are patent. The paranasal sinuses and mastoid air cells are clear. There is reversal of the normal cervical lordosis which may be positional or due to muscle spasm. No acute fracture. The visualized aortic arch, carotid, and vertebral arteries appear unremarkable. The visualized brain is unremarkable. The orbits and retro-orbital fat are preserved. No significant lymphadenopathy.  IMPRESSION: A 3 mm right submandibular gland duct calculus with inflammatory changes of the right submandibular gland compatible with sialadenitis. No drainable fluid collection/ abscess.   Electronically Signed   By: Anner Crete M.D.   On: 07/26/2014 22:09     Procedure Note :     Procedure : Cryosurgery   Indication:  Wart(s)  RLE   Risks including unsuccessful procedure , bleeding, infection, bruising, scar, a need for a repeat  procedure and others were  explained to the patient in detail as well as the benefits. Informed consent was obtained verbally.    2 lesion(s)  on BLE   was/were treated with liquid nitrogen on a Q-tip in a usual fasion . Band-Aid was applied and antibiotic ointment was given for a later use.   Tolerated well. Complications none.   Postprocedure instructions :     Keep the wounds clean. You can wash them with liquid soap and water. Pat dry with gauze or a Kleenex tissue  Before applying antibiotic ointment and a Band-Aid.   You need to report immediately  if  any signs of infection develop.     Assessment & Plan:   Diagnoses and all orders for this visit:  B12 deficiency Orders: -     Basic metabolic panel; Future -     Magnesium; Future -     B12; Future  Hypokalemia Orders: -     Basic metabolic panel; Future -     Magnesium; Future -     B12;  Future  Sialoadenitis Orders: -     Basic metabolic panel; Future -     Magnesium; Future -     B12; Future  Wart viral  Other orders -     Probiotic Product (ALIGN) 4 MG CAPS; Take 1 capsule by mouth daily. -     potassium chloride SA (K-DUR,KLOR-CON) 20 MEQ tablet; Take 1 tablet (20 mEq total) by mouth daily.  I am having Ms. Brosky start on ALIGN. I am also having her maintain her Cyanocobalamin, loratadine, Cholecalciferol (VITAMIN D PO), hydrOXYzine, clonazePAM, DEPLIN 15, omeprazole, ranitidine, HYDROcodone-acetaminophen, norethindrone-ethinyl estradiol, cyclobenzaprine, valACYclovir, topiramate, ibuprofen, clindamycin, clonazePAM, gabapentin, phentermine, fluconazole, and potassium chloride SA.  Meds ordered this encounter  Medications  . clindamycin (CLEOCIN) 300 MG capsule    Sig: 3 (three) times daily.  . clonazePAM (KLONOPIN) 0.5 MG disintegrating tablet    Sig: DISSOLVE 2 TABLETS ON THE TONGUE 3 TIMES A DAY AS NEEDED    Refill:  0  . gabapentin (NEURONTIN) 300 MG capsule    Sig: Take 300 mg by mouth 3 (three) times daily.    Refill:  0  . phentermine (ADIPEX-P) 37.5 MG tablet    Sig: Take 37.5 mg by mouth every morning.    Refill:  2  . fluconazole (DIFLUCAN) 100 MG tablet    Sig: daily.  . Probiotic Product (ALIGN) 4 MG CAPS    Sig: Take 1 capsule by mouth daily.    Dispense:  30 capsule    Refill:  0  . potassium chloride SA (K-DUR,KLOR-CON) 20 MEQ tablet    Sig: Take 1 tablet (20 mEq total) by mouth daily.    Dispense:  30 tablet    Refill:  3     Follow-up: Return in about 3 months (around 10/29/2014) for a follow-up visit.  Walker Kehr, MD

## 2014-07-29 NOTE — Assessment & Plan Note (Signed)
Cryo  

## 2014-07-29 NOTE — Assessment & Plan Note (Signed)
On [employer]

## 2014-07-29 NOTE — Assessment & Plan Note (Signed)
Re-start B12 

## 2014-07-29 NOTE — Patient Instructions (Signed)
   Postprocedure instructions :     Keep the wounds clean. You can wash them with liquid soap and water. Pat dry with gauze or a Kleenex tissue  Before applying antibiotic ointment and a Band-Aid.   You need to report immediately  if  any signs of infection develop.    

## 2014-07-29 NOTE — Progress Notes (Signed)
Pre visit review using our clinic review tool, if applicable. No additional management support is needed unless otherwise documented below in the visit note. 

## 2014-09-22 ENCOUNTER — Other Ambulatory Visit: Payer: Self-pay | Admitting: Gynecology

## 2014-11-04 ENCOUNTER — Other Ambulatory Visit: Payer: Self-pay | Admitting: Gynecology

## 2014-11-04 NOTE — Telephone Encounter (Signed)
Patient has CE Nov 23, 2014.

## 2014-11-23 ENCOUNTER — Other Ambulatory Visit (HOSPITAL_COMMUNITY)
Admission: RE | Admit: 2014-11-23 | Discharge: 2014-11-23 | Disposition: A | Discharge status: Home / Self Care | Payer: 59 | Source: Ambulatory Visit | Attending: Gynecology | Admitting: Gynecology

## 2014-11-23 ENCOUNTER — Telehealth: Payer: Self-pay | Admitting: *Deleted

## 2014-11-23 ENCOUNTER — Encounter: Payer: Self-pay | Admitting: Gynecology

## 2014-11-23 ENCOUNTER — Ambulatory Visit (INDEPENDENT_AMBULATORY_CARE_PROVIDER_SITE_OTHER): Payer: 59 | Admitting: Gynecology

## 2014-11-23 VITALS — BP 120/76 | Ht 68.0 in | Wt 162.0 lb

## 2014-11-23 DIAGNOSIS — Z3009 Encounter for other general counseling and advice on contraception: Secondary | ICD-10-CM | POA: Diagnosis not present

## 2014-11-23 DIAGNOSIS — Z01419 Encounter for gynecological examination (general) (routine) without abnormal findings: Secondary | ICD-10-CM | POA: Insufficient documentation

## 2014-11-23 DIAGNOSIS — Z7689 Persons encountering health services in other specified circumstances: Secondary | ICD-10-CM

## 2014-11-23 DIAGNOSIS — R229 Localized swelling, mass and lump, unspecified: Secondary | ICD-10-CM

## 2014-11-23 MED ORDER — NORETHIN ACE-ETH ESTRAD-FE 1-20 MG-MCG PO TABS: 1.0000 | ORAL_TABLET | Freq: Every day | ORAL | Status: DC

## 2014-11-23 NOTE — Addendum Note (Signed)
Addended by: Nelva Nay on: 11/23/2014 09:41 AM   Modules accepted: Orders

## 2014-11-23 NOTE — Progress Notes (Signed)
ASA FATH August 19, 1980 [id number]        34 y.o.  [id number]  No LMP recorded. Patient is not currently having periods (Reason: Oral contraceptives). for annual exam.  Several issues noted below.  Past medical history,surgical history, problem list, medications, allergies, family history and social history were all reviewed and documented as reviewed in the EPIC chart.  ROS:  Performed with pertinent positives and negatives included in the history, assessment and plan.   Additional significant findings :  none   Exam: [name] Counsellor Vitals:   11/23/14 0846  BP: 120/76  Height: 5\' 8"  (1.727 m)  Weight: 162 lb (73.483 kg)   General appearance:  Normal affect, orientation and appearance. Skin: Grossly normal excepting small cutaneous nodule below xiphoid process. Another small cutaneous nodule upper anterior left thigh. Both well circumscribed with no overlying skin changes. Nontender. HEENT: Without gross lesions.  No cervical or supraclavicular adenopathy. Thyroid normal.  Lungs:  Clear without wheezing, rales or rhonchi Cardiac: RR, without RMG Abdominal:  Soft, nontender, without masses, guarding, rebound, organomegaly or hernia Breasts:  Examined lying and sitting without masses, retractions, discharge or axillary adenopathy. Pelvic:  Ext/BUS/vagina normal  Cervix normal. Pap smear done  Uterus anteverted, normal size, shape and contour, midline and mobile nontender   Adnexa  Without masses or tenderness    Anus and perineum  Normal   Rectovaginal  Normal sphincter tone without palpated masses or tenderness.    Assessment/Plan:  34 y.o. [id number] female for annual exam continuous oral contraceptives without menses.   1. Contraceptives management. Patient continues on oral contraceptives continuously. Also continues to smoke. I again reviewed the risks of cigarette smoking, oral contraceptives and age with increasing risk of stroke heart attack DVT. Alternatives reviewed to  include progesterone only such as Nexplanon/Depo-Provera as well as IUDs. Lastly I also reviewed sterilization with her. Strongly recommended the patient consider a Mirena IUD and I provided literature to her. I did refill her pills 6 months to give her time to make her ultimate treatment decision and again she clearly understands the risks. Loestrin 1/20 equivalent refill 6 months. 2. Pap smear/HPV 2014 negative.  Pap smear 2015 negative. Pap smear done today. History of LEEP for CIN-2 in 2012. Clear margins noted. Follow up Pap smear 2 showed ASCUS with negative high-risk HPV.  3. Mammographic screening recommendations between 35 and 40 reviewed.  Maternal grandmother with postmenopausal breast cancer. Patient prefers to wait closer to 40. SBE monthly reviewed. 4. Cutaneous nodules X 2 as described above. Benign in appearance.  Patients going to monitor and if they enlarge she knows to call and I will refer to general surgery to excise. If they remain stable or resolved and she'll follow expectantly as they are not bothersome to her. 5. Stop smoking again discussed. 6. Health maintenance. No routine lab work done as patient reports is done at her primary physician's office. Follow up with contraceptives decision otherwise 1 year, sooner as needed.   Anastasio Auerbach MD, 9:21 AM 11/23/2014

## 2014-11-23 NOTE — Telephone Encounter (Signed)
Pt was seen today for annual c/o bleeding after exam, pt said she has changed pad twice and passed small clots. Pt has had some spotting after exam, but no bleeding. No pain.  Please advise

## 2014-11-23 NOTE — Telephone Encounter (Signed)
Patient did have some bleeding after the exam which I think is due to her cervix being somewhat friable. Watch for now. If continues and office visit.

## 2014-11-23 NOTE — Telephone Encounter (Signed)
Unable to leave a voicemail as it was not set up

## 2014-11-23 NOTE — Patient Instructions (Signed)
Think of all your contraceptives options and follow up with your ultimate decision.  Consider following up for a Mirena IUD.   You may obtain a copy of any labs that were done today by logging onto MyChart as outlined in the instructions provided with your AVS (after visit summary). The office will not call with normal lab results but certainly if there are any significant abnormalities then we will contact you.   Health Maintenance Adopting a healthy lifestyle and getting preventive care can go a long way to promote health and wellness. Talk with your health care provider about what schedule of regular examinations is right for you. This is a good chance for you to check in with your provider about disease prevention and staying healthy. In between checkups, there are plenty of things you can do on your own. Experts have done a lot of research about which lifestyle changes and preventive measures are most likely to keep you healthy. Ask your health care provider for more information. WEIGHT AND DIET  Eat a healthy diet  Be sure to include plenty of vegetables, fruits, low-fat dairy products, and lean protein.  Do not eat a lot of foods high in solid fats, added sugars, or salt.  Get regular exercise. This is one of the most important things you can do for your health.  Most adults should exercise for at least 150 minutes each week. The exercise should increase your heart rate and make you sweat (moderate-intensity exercise).  Most adults should also do strengthening exercises at least twice a week. This is in addition to the moderate-intensity exercise.  Maintain a healthy weight  Body mass index (BMI) is a measurement that can be used to identify possible weight problems. It estimates body fat based on height and weight. Your health care provider can help determine your BMI and help you achieve or maintain a healthy weight.  For females 39 years of age and older:   A BMI below 18.5 is  considered underweight.  A BMI of 18.5 to 24.9 is normal.  A BMI of 25 to 29.9 is considered overweight.  A BMI of 30 and above is considered obese.  Watch levels of cholesterol and blood lipids  You should start having your blood tested for lipids and cholesterol at 36 years of age, then have this test every 5 years.  You may need to have your cholesterol levels checked more often if:  Your lipid or cholesterol levels are high.  You are older than 36 years of age.  You are at high risk for heart disease.  CANCER SCREENING   Lung Cancer  Lung cancer screening is recommended for adults 32-62 years old who are at high risk for lung cancer because of a history of smoking.  A yearly low-dose CT scan of the lungs is recommended for people who:  Currently smoke.  Have quit within the past 15 years.  Have at least a 30-pack-year history of smoking. A pack year is smoking an average of one pack of cigarettes a day for 1 year.  Yearly screening should continue until it has been 15 years since you quit.  Yearly screening should stop if you develop a health problem that would prevent you from having lung cancer treatment.  Breast Cancer  Practice breast self-awareness. This means understanding how your breasts normally appear and feel.  It also means doing regular breast self-exams. Let your health care provider know about any changes, no matter how small.  If you are in your 20s or 30s, you should have a clinical breast exam (CBE) by a health care provider every 1-3 years as part of a regular health exam.  If you are 90 or older, have a CBE every year. Also consider having a breast X-ray (mammogram) every year.  If you have a family history of breast cancer, talk to your health care provider about genetic screening.  If you are at high risk for breast cancer, talk to your health care provider about having an MRI and a mammogram every year.  Breast cancer gene (BRCA)  assessment is recommended for women who have family members with BRCA-related cancers. BRCA-related cancers include:  Breast.  Ovarian.  Tubal.  Peritoneal cancers.  Results of the assessment will determine the need for genetic counseling and BRCA1 and BRCA2 testing. Cervical Cancer Routine pelvic examinations to screen for cervical cancer are no longer recommended for nonpregnant women who are considered low risk for cancer of the pelvic organs (ovaries, uterus, and vagina) and who do not have symptoms. A pelvic examination may be necessary if you have symptoms including those associated with pelvic infections. Ask your health care provider if a screening pelvic exam is right for you.   The Pap test is the screening test for cervical cancer for women who are considered at risk.  If you had a hysterectomy for a problem that was not cancer or a condition that could lead to cancer, then you no longer need Pap tests.  If you are older than 65 years, and you have had normal Pap tests for the past 10 years, you no longer need to have Pap tests.  If you have had past treatment for cervical cancer or a condition that could lead to cancer, you need Pap tests and screening for cancer for at least 20 years after your treatment.  If you no longer get a Pap test, assess your risk factors if they change (such as having a new sexual partner). This can affect whether you should start being screened again.  Some women have medical problems that increase their chance of getting cervical cancer. If this is the case for you, your health care provider may recommend more frequent screening and Pap tests.  The human papillomavirus (HPV) test is another test that may be used for cervical cancer screening. The HPV test looks for the virus that can cause cell changes in the cervix. The cells collected during the Pap test can be tested for HPV.  The HPV test can be used to screen women 42 years of age and older.  Getting tested for HPV can extend the interval between normal Pap tests from three to five years.  An HPV test also should be used to screen women of any age who have unclear Pap test results.  After 36 years of age, women should have HPV testing as often as Pap tests.  Colorectal Cancer  This type of cancer can be detected and often prevented.  Routine colorectal cancer screening usually begins at 36 years of age and continues through 36 years of age.  Your health care provider may recommend screening at an earlier age if you have risk factors for colon cancer.  Your health care provider may also recommend using home test kits to check for hidden blood in the stool.  A small camera at the end of a tube can be used to examine your colon directly (sigmoidoscopy or colonoscopy). This is done to check for  the earliest forms of colorectal cancer.  Routine screening usually begins at age 71.  Direct examination of the colon should be repeated every 5-10 years through 36 years of age. However, you may need to be screened more often if early forms of precancerous polyps or small growths are found. Skin Cancer  Check your skin from head to toe regularly.  Tell your health care provider about any new moles or changes in moles, especially if there is a change in a mole's shape or color.  Also tell your health care provider if you have a mole that is larger than the size of a pencil eraser.  Always use sunscreen. Apply sunscreen liberally and repeatedly throughout the day.  Protect yourself by wearing long sleeves, pants, a wide-brimmed hat, and sunglasses whenever you are outside. HEART DISEASE, DIABETES, AND HIGH BLOOD PRESSURE   Have your blood pressure checked at least every 1-2 years. High blood pressure causes heart disease and increases the risk of stroke.  If you are between 84 years and 71 years old, ask your health care provider if you should take aspirin to prevent  strokes.  Have regular diabetes screenings. This involves taking a blood sample to check your fasting blood sugar level.  If you are at a normal weight and have a low risk for diabetes, have this test once every three years after 36 years of age.  If you are overweight and have a high risk for diabetes, consider being tested at a younger age or more often. PREVENTING INFECTION  Hepatitis B  If you have a higher risk for hepatitis B, you should be screened for this virus. You are considered at high risk for hepatitis B if:  You were born in a country where hepatitis B is common. Ask your health care provider which countries are considered high risk.  Your parents were born in a high-risk country, and you have not been immunized against hepatitis B (hepatitis B vaccine).  You have HIV or AIDS.  You use needles to inject street drugs.  You live with someone who has hepatitis B.  You have had sex with someone who has hepatitis B.  You get hemodialysis treatment.  You take certain medicines for conditions, including cancer, organ transplantation, and autoimmune conditions. Hepatitis C  Blood testing is recommended for:  Everyone born from [birth year] through 1967.  Anyone with known risk factors for hepatitis C. Sexually transmitted infections (STIs)  You should be screened for sexually transmitted infections (STIs) including gonorrhea and chlamydia if:  You are sexually active and are younger than 36 years of age.  You are older than 36 years of age and your health care provider tells you that you are at risk for this type of infection.  Your sexual activity has changed since you were last screened and you are at an increased risk for chlamydia or gonorrhea. Ask your health care provider if you are at risk.  If you do not have HIV, but are at risk, it may be recommended that you take a prescription medicine daily to prevent HIV infection. This is called pre-exposure prophylaxis  (PrEP). You are considered at risk if:  You are sexually active and do not regularly use condoms or know the HIV status of your partner(s).  You take drugs by injection.  You are sexually active with a partner who has HIV. Talk with your health care provider about whether you are at high risk of being infected with HIV. If you  choose to begin PrEP, you should first be tested for HIV. You should then be tested every 3 months for as long as you are taking PrEP.  PREGNANCY   If you are premenopausal and you may become pregnant, ask your health care provider about preconception counseling.  If you may become pregnant, take 400 to 800 micrograms (mcg) of folic acid every day.  If you want to prevent pregnancy, talk to your health care provider about birth control (contraception). OSTEOPOROSIS AND MENOPAUSE   Osteoporosis is a disease in which the bones lose minerals and strength with aging. This can result in serious bone fractures. Your risk for osteoporosis can be identified using a bone density scan.  If you are 31 years of age or older, or if you are at risk for osteoporosis and fractures, ask your health care provider if you should be screened.  Ask your health care provider whether you should take a calcium or vitamin D supplement to lower your risk for osteoporosis.  Menopause may have certain physical symptoms and risks.  Hormone replacement therapy may reduce some of these symptoms and risks. Talk to your health care provider about whether hormone replacement therapy is right for you.  HOME CARE INSTRUCTIONS   Schedule regular health, dental, and eye exams.  Stay current with your immunizations.   Do not use any tobacco products including cigarettes, chewing tobacco, or electronic cigarettes.  If you are pregnant, do not drink alcohol.  If you are breastfeeding, limit how much and how often you drink alcohol.  Limit alcohol intake to no more than 1 drink per day for  nonpregnant women. One drink equals 12 ounces of beer, 5 ounces of wine, or 1 ounces of hard liquor.  Do not use street drugs.  Do not share needles.  Ask your health care provider for help if you need support or information about quitting drugs.  Tell your health care provider if you often feel depressed.  Tell your health care provider if you have ever been abused or do not feel safe at home. Document Released: 07/18/2010 Document Revised: 05/19/2013 Document Reviewed: 12/04/2012 [facility] Patient Information 2015 [place], [place]. This information is not intended to replace advice given to you by your health care provider. Make sure you discuss any questions you have with your health care provider.

## 2014-11-25 LAB — CYTOLOGY - PAP

## 2014-12-25 ENCOUNTER — Encounter: Payer: Self-pay | Admitting: Gastroenterology

## 2015-03-12 ENCOUNTER — Telehealth: Payer: Self-pay

## 2015-03-12 DIAGNOSIS — Z7689 Persons encountering health services in other specified circumstances: Secondary | ICD-10-CM

## 2015-03-12 NOTE — Telephone Encounter (Signed)
FMLA paperwork received and partially completed. Placed on MD's desk for signature

## 2015-03-16 NOTE — Telephone Encounter (Signed)
Paperwork signed, faxed, copy sent to scan and original placed in cabinet for pt pick up. Pt advised of same

## 2015-03-23 ENCOUNTER — Telehealth: Payer: Self-pay | Admitting: *Deleted

## 2015-03-23 MED ORDER — FLUCONAZOLE 150 MG PO TABS: 150.0000 mg | ORAL_TABLET | Freq: Once | ORAL | Status: DC

## 2015-03-23 NOTE — Telephone Encounter (Signed)
Pt called c/o yeast infection itching and white discharge, was treated with antibiotic for sinus infection, asked if you would be willing to send diflucan tablet to pharmacy. Pt is aware OV best. Please advise

## 2015-03-23 NOTE — Telephone Encounter (Signed)
Okay for Diflucan 150 mg 1 dose 

## 2015-03-23 NOTE — Telephone Encounter (Signed)
Pt informed with the below note. Rx sent.  

## 2015-04-28 ENCOUNTER — Telehealth: Payer: Self-pay | Admitting: *Deleted

## 2015-04-28 MED ORDER — NORETHIN ACE-ETH ESTRAD-FE 1-20 MG-MCG PO TABS: 1.0000 | ORAL_TABLET | Freq: Every day | ORAL | Status: DC

## 2015-04-28 NOTE — Telephone Encounter (Signed)
Pt called asking if you could extend her birth control pills until august or September, was only given 6 months of pills to make a decision for contraceptive management, pt is smoker. Pt asked for the extended time because her farther has cancer and she has been busy helping her mother due to this. Please advise

## 2015-04-28 NOTE — Telephone Encounter (Signed)
Okay to refill through her next exam in November. Remind her the main risk of pills is blood clots which seemed to be higher in patients who smoke in their 30s.

## 2015-04-28 NOTE — Telephone Encounter (Signed)
Pt aware of the below note, Rx sent.

## 2015-04-28 NOTE — Telephone Encounter (Signed)
Unable to leave a message for pt to call.

## 2015-05-06 ENCOUNTER — Ambulatory Visit (INDEPENDENT_AMBULATORY_CARE_PROVIDER_SITE_OTHER): Payer: 59 | Admitting: Family Medicine

## 2015-05-06 ENCOUNTER — Encounter: Payer: Self-pay | Admitting: Family Medicine

## 2015-05-06 DIAGNOSIS — L509 Urticaria, unspecified: Secondary | ICD-10-CM | POA: Diagnosis not present

## 2015-05-06 DIAGNOSIS — J069 Acute upper respiratory infection, unspecified: Secondary | ICD-10-CM | POA: Diagnosis not present

## 2015-05-06 MED ORDER — HYDROXYZINE HCL 25 MG PO TABS: 25.0000 mg | ORAL_TABLET | Freq: Three times a day (TID) | ORAL | Status: DC | PRN

## 2015-05-06 MED ORDER — METHYLPREDNISOLONE ACETATE 80 MG/ML IJ SUSP: 80.0000 mg | Freq: Once | INTRAMUSCULAR | Status: DC

## 2015-05-06 MED ORDER — METHYLPREDNISOLONE ACETATE 80 MG/ML IJ SUSP
80.0000 mg | Freq: Once | INTRAMUSCULAR | Status: AC
  Administered 2015-05-06: 80 mg via INTRAMUSCULAR

## 2015-05-06 NOTE — Progress Notes (Signed)
Subjective:    Patient ID: Christine Duncan, female    DOB: [birth year], 35 y.o.   MRN: [id number]  HPI  [name] is a 35 y.o.female here today complaining of 2 days of pruritic rash, urticaria.  She states that she has had this rash intermittently for about 2 years and the "only" thing that helps is "steroid shot", she has episodes about q 5-6 months depending of stress level, which seems to trigger episodes. Rash is localized on face, perioral, forehead, and on eye brows; some have resolved.  She denies any new medication, detergent, soap, or body product. Last night she waxed eye brows. No known insect bite or outdoor exposures to plants. No sick contact with similar rash.   She has not tried OTC antihistaminic because she knows "nothing" helps. She denies oral lesions/edema,cough, wheezing, dyspnea, abdominal pain, diarrhea,nausea, or vomiting.  No Hx of recent travel.  No known insect bite. + Hx of allergies.  She is also c/o some upper respiratory symptoms: odynophagia, body aches, rhinorrhea, and nasal congestion. She has hx of allergic rhinitis and fibromyalgia, not sure if these are causing symptoms. Odynophagia resolved. No fever or chills. Coworkers have been sick with similar symptoms.  She has tried OTC cold medications. Symptoms otherwise stable.   Review of Systems  Constitutional: Negative for fever, activity change, appetite change and fatigue.  HENT: Positive for congestion, postnasal drip and rhinorrhea. Negative for ear pain, facial swelling, mouth sores, sinus pressure, sneezing, sore throat, trouble swallowing and voice change.   Eyes: Negative for discharge and redness.  Respiratory: Negative for cough, chest tightness, shortness of breath, wheezing and stridor.   Cardiovascular: Negative for chest pain, palpitations and leg swelling.  Gastrointestinal: Negative for nausea, vomiting, abdominal pain and diarrhea.  Musculoskeletal: Positive for  myalgias (hx of fibromyalgia). Negative for joint swelling and neck pain.  Skin: Positive for rash.  Allergic/Immunologic: Positive for environmental allergies.  Neurological: Negative for weakness, numbness and headaches.  Hematological: Negative for adenopathy. Does not bruise/bleed easily.  Psychiatric/Behavioral: The patient is nervous/anxious.        Reporting increased stress due to father terminal illness.     Current Outpatient Prescriptions on File Prior to Visit  Medication Sig Dispense Refill  . Cholecalciferol (VITAMIN D PO) Take by mouth.    . clonazePAM (KLONOPIN) 0.5 MG disintegrating tablet DISSOLVE 2 TABLETS ON THE TONGUE 3 TIMES A DAY AS NEEDED  0  . Cyanocobalamin 1000 MCG SUBL Place 1 tablet (1,000 mcg total) under the tongue daily. 100 tablet 3  . cyclobenzaprine (FLEXERIL) 5 MG tablet Take 1 tablet (5 mg total) by mouth 3 (three) times daily as needed for muscle spasms. 270 tablet 2  . gabapentin (NEURONTIN) 300 MG capsule Take 300 mg by mouth 3 (three) times daily.  0  . HYDROcodone-acetaminophen (NORCO) 7.5-325 MG per tablet Take 1 tablet by mouth 4 (four) times daily as needed. 60 tablet 0  . lamoTRIgine (LAMICTAL) 100 MG tablet Take 100 mg by mouth daily.    Marland Kitchen loratadine (CLARITIN) 10 MG tablet Take 1 tablet (10 mg total) by mouth daily. 100 tablet 3  . norethindrone-ethinyl estradiol (JUNEL FE 1/20) 1-20 MG-MCG tablet Take 1 tablet by mouth daily. 84 tablet 2  . omeprazole (PRILOSEC) 20 MG capsule Take 2 capsules by mouth daily. Please disregard last RX sent over signed by Dr Debby Bud 180 capsule 3  . phentermine (ADIPEX-P) 37.5 MG tablet Take 37.5 mg by mouth every  morning.  2  . topiramate (TOPAMAX) 100 MG tablet Take 100 mg by mouth daily.    . fluconazole (DIFLUCAN) 150 MG tablet Take 1 tablet (150 mg total) by mouth once. (Patient not taking: Reported on 05/06/2015) 1 tablet 0  . Probiotic Product (ALIGN) 4 MG CAPS Take 1 capsule by mouth daily. (Patient not  taking: Reported on 05/06/2015) 30 capsule 0   No current facility-administered medications on file prior to visit.     Past Medical History  Diagnosis Date  . Blood type, Rh negative   . IBS (irritable bowel syndrome)   . Acid reflux   . Bipolar 1 disorder (HCC)   . B12 deficiency   . Spastic colon   . Hives   . Essential tremor   . Allergy   . Anemia   . Anxiety   . Depression   . Neuromuscular disorder (HCC)     fibromylagia  . Fibromyalgia   . Cervical dysplasia     Social History   Social History  . Marital Status: Married    Spouse Name: N/A  . Number of Children: N/A  . Years of Education: N/A   Occupational History  . WORKS [employer]     [employer]   Social History Main Topics  . Smoking status: Current Every Day Smoker -- 0.75 packs/day    Types: Cigarettes  . Smokeless tobacco: Never Used  . Alcohol Use: 0.0 oz/week    0 Standard drinks or equivalent per week     Comment: Rare  . Drug Use: No  . Sexual Activity: Yes    Birth Control/ Protection: Pill     Comment: 1st intercourse 35 yo-Fewer than 5 partners   Other Topics Concern  . None   Social History Narrative   Regular exercise- yes    Filed Vitals:   05/06/15 1113  BP: 126/80  Pulse: 85  Temp: 99 F (37.2 C)  Resp: 20   Body mass index is 24.64 kg/(m^2).  SpO2 Readings from Last 3 Encounters:  05/06/15 98%  07/29/14 98%  07/27/14 100%       Objective:   Physical Exam  Constitutional: She is oriented to person, place, and time. She appears well-developed and well-nourished. No distress.  HENT:  Head: Atraumatic.  Nose: Rhinorrhea present. Right sinus exhibits no maxillary sinus tenderness and no frontal sinus tenderness. Left sinus exhibits no maxillary sinus tenderness and no frontal sinus tenderness.  Mouth/Throat: Uvula is midline and oropharynx is clear and moist. No oropharyngeal exudate, posterior oropharyngeal edema or posterior oropharyngeal erythema.  Mild  hypertrophic turbinates.  Eyes: Conjunctivae and EOM are normal. Pupils are equal, round, and reactive to light.  Cardiovascular: Normal rate and regular rhythm.   No murmur heard. Pulmonary/Chest: Effort normal and breath sounds normal. No respiratory distress. She has no wheezes. She has no rales.  Musculoskeletal: Normal range of motion. She exhibits no edema or tenderness.  Lymphadenopathy:       Head (right side): No submental and no submandibular adenopathy present.       Head (left side): No submental and no submandibular adenopathy present.    She has cervical adenopathy (small posterior cervical lymph nose, < 1 cm, no tender, bilateral).  Neurological: She is alert and oriented to person, place, and time. Coordination and gait normal.  Skin: Rash noted. No petechiae noted. Rash is macular. There is erythema.  Macular, erythematous, confluent rash noted on forehead and around eye brows bilateral, no edema. It  is not tender.  Psychiatric: She has a normal mood and affect.  Well groomed, good eye contact       Assessment & Plan:    Christine Duncan was seen today for urticaria.  Diagnoses and all orders for this visit:  URTICARIA  Here in office after verbal consent she received Depo-Medrol 80 mg IM. She can continue with Hydroxyzine as needed, some side effects discussed. Instructed about warning signs. F/U as needed.  -     Discontinue: methylPREDNISolone acetate (DEPO-MEDROL) injection 80 mg; Inject 1 mL (80 mg total) into the muscle once. -     hydrOXYzine (ATARAX/VISTARIL) 25 MG tablet; Take 1 tablet (25 mg total) by mouth every 8 (eight) hours as needed for itching. -     Discontinue: methylPREDNISolone acetate (DEPO-MEDROL) injection 80 mg; Inject 1 mL (80 mg total) into the muscle once. -     methylPREDNISolone acetate (DEPO-MEDROL) injection 80 mg; Inject 1 mL (80 mg total) into the muscle once.  URI, acute  Mild and seems improving. Symptomatic treatment for now. Some  of her symptoms could also be related to allergic rhinitis. OTC Flonase nasal spray may help. F/U as needed.  -     methylPREDNISolone acetate (DEPO-MEDROL) injection 80 mg; Inject 1 mL (80 mg total) into the muscle once.     Kawena Lyday G. [place], MD  [facility] [place] office.

## 2015-05-06 NOTE — Patient Instructions (Addendum)
A few things to remember from today's visit:   1. URTICARIA If difficulty breathing or facial edema seek immediate medical care.  - methylPREDNISolone acetate (DEPO-MEDROL) injection 80 mg; Inject 1 mL (80 mg total) into the muscle once. - hydrOXYzine (ATARAX/VISTARIL) 25 MG tablet; Take 1 tablet (25 mg total) by mouth every 8 (eight) hours as needed for itching.  Dispense: 60 tablet; Refill: 0  2. URI, acute viral infections are self-limited and treatment is symptomatic. Tylenol and/or Ibuprofen may help with symptoms. Plenty of fluids. Honey may help with cough. Cough and nasal congestion could last a few days and sometimes weeks. Please follow in not any better in 1-2 weeks or if worsening symptoms.

## 2015-05-06 NOTE — Progress Notes (Signed)
Pre visit review using our clinic review tool, if applicable. No additional management support is needed unless otherwise documented below in the visit note. 

## 2015-08-25 ENCOUNTER — Encounter: Payer: Self-pay | Admitting: Family Medicine

## 2015-08-25 ENCOUNTER — Ambulatory Visit (INDEPENDENT_AMBULATORY_CARE_PROVIDER_SITE_OTHER): Payer: 59 | Admitting: Family Medicine

## 2015-08-25 VITALS — BP 129/78 | HR 91 | Temp 98.4°F | Ht 68.0 in | Wt 165.8 lb

## 2015-08-25 DIAGNOSIS — L508 Other urticaria: Secondary | ICD-10-CM

## 2015-08-25 DIAGNOSIS — B029 Zoster without complications: Secondary | ICD-10-CM | POA: Diagnosis not present

## 2015-08-25 MED ORDER — VALACYCLOVIR HCL 1 G PO TABS: 1000.0000 mg | ORAL_TABLET | Freq: Three times a day (TID) | ORAL | 0 refills | Status: DC

## 2015-08-25 MED ORDER — EPINEPHRINE 0.3 MG/0.3ML IJ SOAJ: 0.3000 mg | Freq: Once | INTRAMUSCULAR | 1 refills | Status: AC

## 2015-08-25 NOTE — Progress Notes (Signed)
Pre visit review using our clinic review tool, if applicable. No additional management support is needed unless otherwise documented below in the visit note. 

## 2015-08-25 NOTE — Progress Notes (Signed)
[place] at [facility] [address], [place],  [address] [phone]  Date:  08/25/2015   Name:  Christine Duncan   DOB:  07/11/1980   MRN:  [id number]  PCP:  Walker Kehr, MD    Chief Complaint: Herpes Zoster (c/o shingles coming back noticed 2 days ago. Pt also would like a steroid injection for chronic hives. Pt's father passed away 06-Sep-2022. Upper stomach pain noticed this morning. )   History of Present Illness:  Christine Duncan is a 35 y.o. very pleasant female patient who presents with the following:  New patient to me today-  She was last seen in April at which time she had complaint of chronic intermittent hives, she was given depo-medrol IM, hydroxyzine as needed.   Her father was very ill with esophageal cancer and passed away on 09-06-22. She is doing pretty ok, her family is also getting by.  She is sad but stable  She will get steroid injections periodically for her chronic hives.  States that her hives are not allergic- they are caused by stress or friction.  Benadryl does not help when she has these- not even IV benadryl  She has noted worsening of her hives over the last 4 months.  The steroid shot she had in April partially relieved her sx but she notes that they never went away totally.  She may get a steroid shot 2-3x a year.   She did have shingles a couple of years ago- she feels like she is getting the same feeling again on the left side of her back and is afriad it may get worse.  She noted it a couple of days ago- she feels a couple of bumps there and notes that it is tender, that her clothing hurts the skin.  This is much like when she had shingles in the past. She had shingles a few years ago No fever or chills She notes that her tongue seemed to swell yesterday but today it is fine She also felt a heavy feeling in her chest this am when she woke up- now resolved.  She does not have any heart problems.    Vital signs are reassuring. She is on continuous OCP and does not have regular menses    Patient Active Problem List   Diagnosis Date Noted  . Hypokalemia 07/29/2014  . Sialoadenitis 07/29/2014  . Wart viral 07/29/2014  . Shingles outbreak 12/10/2013  . Hoarseness 12/04/2013  . Knee MCL sprain 09/02/2013  . Acute meniscal tear of left knee 09/02/2013  . Pes anserine bursitis 09/02/2013  . Pes planus 09/02/2013  . Bipolar I disorder, most recent episode (or current) manic ([place]) 11/27/2012  . Dyslipidemia 11/07/2012  . Fibromyalgia muscle pain 09/13/2011  . LBP (low back pain) 09/13/2011  . Spastic colon   . Essential tremor   . Rosacea 08/02/2010  . EYE FLOATERS 03/29/2010  . Essential hypertension 01/25/2010  . SWEATING 01/25/2010  . CIGARETTE SMOKER 10/21/2008  . FATIGUE 10/21/2008  . URTICARIA 06/06/2008  . ESOPHAGITIS 05/08/2008  . GASTRITIS, CHRONIC 05/08/2008  . COLITIS 05/08/2008  . IBS 05/08/2008  . DYSTONIA 04/20/2008  . GERD 04/13/2008  . B12 deficiency 01/20/2008  . INSOMNIA, PERSISTENT 01/20/2008  . Myoclonus 01/20/2008  . 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 SYNCOPE 08/25/2006    Past Medical History:  Diagnosis Date  . Acid reflux   . Allergy   .  Anemia   . Anxiety   . B12 deficiency   . Bipolar 1 disorder ([place])   . Blood type, Rh negative   . Cervical dysplasia   . Depression   . Essential tremor   . Fibromyalgia   . Hives   . IBS (irritable bowel syndrome)   . Neuromuscular disorder ([place])    fibromylagia  . Spastic colon     Past Surgical History:  Procedure Laterality Date  . CERVICAL BIOPSY  W/ LOOP ELECTRODE EXCISION    . CESAREAN SECTION  2006  . CESAREAN SECTION  2011  . COLPOSCOPY    . DILATION AND CURETTAGE OF UTERUS  2005  . 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 Smoking status: Current Every Day Smoker    Packs/day: 0.75    Types: Cigarettes  . Smokeless  tobacco: Never Used  . Alcohol use 0.0 oz/week     Comment: Rare    Family History  Problem Relation Age of Onset  . Thyroid disease Mother   . Depression Mother   . Cancer Father 13    esoph ca  . Esophageal cancer Father   . Seizures Maternal Grandmother   . Thyroid disease Maternal Grandmother   . Depression Maternal Grandmother   . Hyperlipidemia Paternal Grandmother   . Diabetes Paternal Grandmother   . Breast cancer Paternal Grandmother 18  . Cancer Paternal Grandmother     Bladder and colon cancer  . Colon cancer Paternal Grandmother   . Bipolar disorder Paternal Grandmother   . Rectal cancer Neg Hx   . Stomach cancer Neg Hx   . Bipolar disorder Maternal Grandfather   . Alcohol abuse Maternal Grandfather   . Drug abuse Maternal Grandfather     Allergies  Allergen Reactions  . Carbidopa-Levodopa     REACTION: nausea  . Sertraline Hcl     REACTION: Mania    Medication list has been reviewed and updated.  Current Outpatient Prescriptions on File Prior to Visit  Medication Sig Dispense Refill  . Cholecalciferol (VITAMIN D PO) Take by mouth.    . clonazePAM (KLONOPIN) 0.5 MG disintegrating tablet DISSOLVE 2 TABLETS ON THE TONGUE 3 TIMES A DAY AS NEEDED  0  . Cyanocobalamin 1000 MCG SUBL Place 1 tablet (1,000 mcg total) under the tongue daily. 100 tablet 3  . cyclobenzaprine (FLEXERIL) 5 MG tablet Take 1 tablet (5 mg total) by mouth 3 (three) times daily as needed for muscle spasms. 270 tablet 2  . fluconazole (DIFLUCAN) 150 MG tablet Take 1 tablet (150 mg total) by mouth once. 1 tablet 0  . gabapentin (NEURONTIN) 300 MG capsule Take 300 mg by mouth 3 (three) times daily.  0  . HYDROcodone-acetaminophen (NORCO) 7.5-325 MG per tablet Take 1 tablet by mouth 4 (four) times daily as needed. 60 tablet 0  . hydrOXYzine (ATARAX/VISTARIL) 25 MG tablet Take 1 tablet (25 mg total) by mouth every 8 (eight) hours as needed for itching. 60 tablet 0  . lamoTRIgine (LAMICTAL) 100  MG tablet Take 100 mg by mouth daily.    Marland Kitchen loratadine (CLARITIN) 10 MG tablet Take 1 tablet (10 mg total) by mouth daily. 100 tablet 3  . norethindrone-ethinyl estradiol (JUNEL FE 1/20) 1-20 MG-MCG tablet Take 1 tablet by mouth daily. 84 tablet 2  . omeprazole (PRILOSEC) 20 MG capsule Take 2 capsules by mouth daily. Please disregard last RX sent over signed by Dr  Norins 180 capsule 3  . phentermine (ADIPEX-P) 37.5 MG tablet Take 37.5 mg by mouth every morning.  2  . Probiotic Product (ALIGN) 4 MG CAPS Take 1 capsule by mouth daily. 30 capsule 0  . topiramate (TOPAMAX) 100 MG tablet Take 100 mg by mouth daily.     No current facility-administered medications on file prior to visit.     Review of Systems:  As per HPI- otherwise negative.   Physical Examination: Vitals:   08/25/15 1640  BP: 129/78  Pulse: 91  Temp: 98.4 F (36.9 C)   Vitals:   08/25/15 1640  Weight: 165 lb 12.8 oz (75.2 kg)  Height: 5\' 8"  (1.727 m)   Body mass index is 25.21 kg/m. Ideal Body Weight: Weight in (lb) to have BMI = 25: 164.1  GEN: WDWN, NAD, Non-toxic, A & O x 3, mild overweight, looks well.  At this time I do not appreciated any hives. She points out redness around her brows, and one spot on her neck which may be a hive or other non- specific irritation  HEENT: Atraumatic, Normocephalic. Neck supple. No masses, No LAD.  Bilateral TM wnl, oropharynx normal.  PEERL,EOMI.   No angioedema Ears and Nose: No external deformity. CV: RRR, No M/G/R. No JVD. No thrill. No extra heart sounds. PULM: CTA B, no wheezes, crackles, rhonchi. No retractions. No resp. distress. No accessory muscle use. Able to reproduce the tenderness she noted this am by pressing on her chest wall At the bra line on the left she has a couple of non- specific skin lesions which could be zoster in this young pt (we would not expect a dramatic rash in someone of her age)  ABD: S, NT, ND, +BS. No rebound. No HSM.  Benign belly at this  time EXTR: No c/c/e NEURO Normal gait.  PSYCH: Normally interactive. Conversant. Not depressed or anxious appearing.  Calm demeanor.   Assessment and Plan: Shingles - Plan: valACYclovir (VALTREX) 1000 MG tablet  Chronic urticaria - Plan: EPINEPHrine 0.3 mg/0.3 mL IJ SOAJ injection  Here today with concern of shingles and chronic urticaria.  At this time I do not appreciate any significant urticaria but certainly these sx can wax and wane. Offered to give her a steroid shot but did advise her that this could worsen her shingles outbreak.  Offered to use po steroids instead so she could stop them in case of worsening.  However she prefers to observe urticaria for now. I did give her an epipen to have on hand Valtrex for possible shingles.   Suspect she had MSK chest pain- offered an EKG today but she declines  Signed Lamar Blinks, MD

## 2015-08-25 NOTE — Patient Instructions (Addendum)
Use the valtrex three times a day for one week for your shingles- I hope that this will resolve your symptoms soon.  I also think it would be smart for you to have an epipen in case you have any swelling of your lips or tongue- I sent this into [facility] for you as well

## 2015-08-26 ENCOUNTER — Ambulatory Visit: Payer: Self-pay | Admitting: Internal Medicine

## 2015-10-22 ENCOUNTER — Ambulatory Visit (INDEPENDENT_AMBULATORY_CARE_PROVIDER_SITE_OTHER): Payer: 59 | Admitting: Internal Medicine

## 2015-10-22 VITALS — BP 132/72 | HR 94 | Temp 98.2°F | Resp 20 | Wt 164.0 lb

## 2015-10-22 DIAGNOSIS — M5440 Lumbago with sciatica, unspecified side: Secondary | ICD-10-CM

## 2015-10-22 DIAGNOSIS — K1120 Sialoadenitis, unspecified: Secondary | ICD-10-CM

## 2015-10-22 DIAGNOSIS — I10 Essential (primary) hypertension: Secondary | ICD-10-CM

## 2015-10-22 DIAGNOSIS — G8929 Other chronic pain: Secondary | ICD-10-CM | POA: Diagnosis not present

## 2015-10-22 MED ORDER — HYDROCODONE-ACETAMINOPHEN 7.5-325 MG PO TABS: 1.0000 | ORAL_TABLET | Freq: Four times a day (QID) | ORAL | 0 refills | Status: DC | PRN

## 2015-10-22 MED ORDER — LEVOFLOXACIN 500 MG PO TABS: 500.0000 mg | ORAL_TABLET | Freq: Every day | ORAL | 0 refills | Status: AC

## 2015-10-22 MED ORDER — FLUCONAZOLE 150 MG PO TABS: 150.0000 mg | ORAL_TABLET | Freq: Once | ORAL | 0 refills | Status: AC

## 2015-10-22 NOTE — Patient Instructions (Signed)
Please take all new medication as prescribed - the antibiotic, and the diflucan if needed  Please continue all other medications as before, and refills have been done if requested - the hydrocodone  Please have the pharmacy call with any other refills you may need.  Please continue your efforts at being more active, low cholesterol diet, and weight control.  Please keep your appointments with your specialists as you may have planned  You will be contacted regarding the referral for: ENT

## 2015-10-22 NOTE — Progress Notes (Signed)
Pre visit review using our clinic review tool, if applicable. No additional management support is needed unless otherwise documented below in the visit note. 

## 2015-10-22 NOTE — Progress Notes (Signed)
Subjective:    Patient ID: Christine Duncan, female    DOB: [birth year], 35 y.o.   MRN: [id number]  HPI  Here with recurrence of pain/swelling/feeling warm to right neck and under jaw, x 2 days similar to illness she had with symptoms previously on the left, tx as sialoadenitis successfully; denies high fever, chills, ST, cough, ear pain or sinus congestion but has had right sided diffuse HA as well. Pt denies chest pain, increased sob or doe, wheezing, orthopnea, PND, increased LE swelling, palpitations, dizziness or syncope.  Pt denies new neurological symptoms such as new headache, or facial or extremity weakness or numbness   Pt denies polydipsia, polyuria.  Pt very concerned about why recurred, asks for ENT referral No hx of ENT malignancy or recent appetite loss or other constitutional symptom.  Always needs diflucan with any antbx due to predictable yeast vaginitis many times before,  And Chronic pain no change - Pt continues to have recurring LBP without change in severity, bowel or bladder change, fever, wt loss,  worsening LE pain/numbness/weakness, gait change or falls. Past Medical History:  Diagnosis Date  . Acid reflux   . Allergy   . Anemia   . Anxiety   . B12 deficiency   . Bipolar 1 disorder ([place])   . Blood type, Rh negative   . Cervical dysplasia   . Depression   . Essential tremor   . Fibromyalgia   . Hives   . IBS (irritable bowel syndrome)   . Neuromuscular disorder ([place])    fibromylagia  . Spastic colon    Past Surgical History:  Procedure Laterality Date  . CERVICAL BIOPSY  W/ LOOP ELECTRODE EXCISION    . CESAREAN SECTION  2006  . CESAREAN SECTION  2011  . COLPOSCOPY    . DILATION AND CURETTAGE OF UTERUS  2005  . LEEP  07/2010   CIN II, Margins free    reports that she has been smoking Cigarettes.  She has been smoking about 0.75 packs per day. She has never used smokeless tobacco. She reports that she drinks alcohol. She reports that she does not use  drugs. family history includes Alcohol abuse in her maternal grandfather; Bipolar disorder in her maternal grandfather and paternal grandmother; Breast cancer (age of onset: 38) in her paternal grandmother; Cancer in her paternal grandmother; Cancer (age of onset: 75) in her father; Colon cancer in her paternal grandmother; Depression in her maternal grandmother and mother; Diabetes in her paternal grandmother; Drug abuse in her maternal grandfather; Esophageal cancer in her father; Hyperlipidemia in her paternal grandmother; Seizures in her maternal grandmother; Thyroid disease in her maternal grandmother and mother. Allergies  Allergen Reactions  . Carbidopa-Levodopa     REACTION: nausea  . Sertraline Hcl     REACTION: Mania   Current Outpatient Prescriptions on File Prior to Visit  Medication Sig Dispense Refill  . clonazePAM (KLONOPIN) 0.5 MG disintegrating tablet DISSOLVE 2 TABLETS ON THE TONGUE 3 TIMES A DAY AS NEEDED  0  . Cyanocobalamin 1000 MCG SUBL Place 1 tablet (1,000 mcg total) under the tongue daily. 100 tablet 3  . cyclobenzaprine (FLEXERIL) 5 MG tablet Take 1 tablet (5 mg total) by mouth 3 (three) times daily as needed for muscle spasms. 270 tablet 2  . gabapentin (NEURONTIN) 300 MG capsule Take 300 mg by mouth 3 (three) times daily.  0  . hydrOXYzine (ATARAX/VISTARIL) 25 MG tablet Take 1 tablet (25 mg total) by mouth every 8 (eight)  hours as needed for itching. 60 tablet 0  . lamoTRIgine (LAMICTAL) 100 MG tablet Take 100 mg by mouth daily.    Marland Kitchen loratadine (CLARITIN) 10 MG tablet Take 1 tablet (10 mg total) by mouth daily. 100 tablet 3  . norethindrone-ethinyl estradiol (JUNEL FE 1/20) 1-20 MG-MCG tablet Take 1 tablet by mouth daily. 84 tablet 2  . omeprazole (PRILOSEC) 20 MG capsule Take 2 capsules by mouth daily. Please disregard last RX sent over signed by Dr Linda Hedges 180 capsule 3  . phentermine (ADIPEX-P) 37.5 MG tablet Take 37.5 mg by mouth every morning.  2  . topiramate  (TOPAMAX) 100 MG tablet Take 100 mg by mouth daily.    . valACYclovir (VALTREX) 1000 MG tablet Take 1 tablet (1,000 mg total) by mouth 3 (three) times daily. 21 tablet 0   No current facility-administered medications on file prior to visit.    Review of Systems  Constitutional: Negative for unusual diaphoresis or night sweats HENT: Negative for ear swelling or discharge Eyes: Negative for worsening visual haziness  Respiratory: Negative for choking and stridor.   Gastrointestinal: Negative for distension or worsening eructation Genitourinary: Negative for retention or change in urine volume.  Musculoskeletal: Negative for other MSK pain or swelling Skin: Negative for color change and worsening wound Neurological: Negative for tremors and numbness other than noted  Psychiatric/Behavioral: Negative for decreased concentration or agitation other than above       Objective:   Physical Exam BP 132/72   Pulse 94   Temp 98.2 F (36.8 C) (Oral)   Resp 20   Wt 164 lb (74.4 kg)   SpO2 97%   BMI 24.94 kg/m  VS noted, mild ill Constitutional: Pt appears in no apparent distress HENT: Head: NCAT.  Right Ear: External ear normal.  Left Ear: External ear normal.  Eyes: . Pupils are equal, round, and reactive to light. Conjunctivae and EOM are normal Bilat tm's without erythema.  Max sinus areas non tender.  Pharynx with mild erythema, no exudate Neck: Normal range of motion. Neck supple. but with tender right submandib and right mid neck (pre SCM) multiple < 1 cm tender subq masses, c/w lymphadenitis vs salivary glands  Cardiovascular: Normal rate and regular rhythm.   Pulmonary/Chest: Effort normal and breath sounds without rales or wheezing.  Abd:  Soft, NT, ND, + BS - no HSM Neurological: Pt is alert. Not confused , motor 5/5 intact Skin: Skin is warm. No rash, no LE edema Psychiatric: Pt behavior is normal. No agitation. 1+ nervous Spine:  Mild tender lowest midline lumbar without  sweling, rash    Assessment & Plan:

## 2015-10-23 NOTE — Assessment & Plan Note (Signed)
Recurrent it seems, on right side this time, Mild to mod, for antibx course,  to f/u any worsening symptoms or concerns, also refer ENT per pt reqeust

## 2015-10-23 NOTE — Assessment & Plan Note (Signed)
stable overall by history and exam, recent data reviewed with pt, and pt to continue medical treatment as before,  to f/u any worsening symptoms or concerns BP Readings from Last 3 Encounters:  10/22/15 132/72  08/25/15 129/78  05/06/15 126/80

## 2015-10-23 NOTE — Assessment & Plan Note (Signed)
Chronic recurrent, no evidence for opioate diversion or misuse, for med refill,  to f/u any worsening symptoms or concerns

## 2015-11-24 ENCOUNTER — Encounter: Payer: 59 | Admitting: Gynecology

## 2015-11-26 ENCOUNTER — Encounter: Payer: Self-pay | Admitting: Internal Medicine

## 2015-12-07 ENCOUNTER — Encounter (HOSPITAL_BASED_OUTPATIENT_CLINIC_OR_DEPARTMENT_OTHER): Payer: Self-pay | Admitting: Emergency Medicine

## 2015-12-07 ENCOUNTER — Emergency Department (HOSPITAL_BASED_OUTPATIENT_CLINIC_OR_DEPARTMENT_OTHER): Payer: 59

## 2015-12-07 ENCOUNTER — Emergency Department (HOSPITAL_BASED_OUTPATIENT_CLINIC_OR_DEPARTMENT_OTHER)
Admission: EM | Admit: 2015-12-07 | Discharge: 2015-12-07 | Disposition: A | Discharge status: Home / Self Care | Payer: 59 | Attending: Emergency Medicine | Admitting: Emergency Medicine

## 2015-12-07 DIAGNOSIS — Z79899 Other long term (current) drug therapy: Secondary | ICD-10-CM | POA: Diagnosis not present

## 2015-12-07 DIAGNOSIS — Y92410 Unspecified street and highway as the place of occurrence of the external cause: Secondary | ICD-10-CM | POA: Diagnosis not present

## 2015-12-07 DIAGNOSIS — S161XXA Strain of muscle, fascia and tendon at neck level, initial encounter: Secondary | ICD-10-CM | POA: Insufficient documentation

## 2015-12-07 DIAGNOSIS — S46811A Strain of other muscles, fascia and tendons at shoulder and upper arm level, right arm, initial encounter: Secondary | ICD-10-CM | POA: Diagnosis not present

## 2015-12-07 DIAGNOSIS — F17210 Nicotine dependence, cigarettes, uncomplicated: Secondary | ICD-10-CM | POA: Insufficient documentation

## 2015-12-07 DIAGNOSIS — Y939 Activity, unspecified: Secondary | ICD-10-CM | POA: Insufficient documentation

## 2015-12-07 DIAGNOSIS — I10 Essential (primary) hypertension: Secondary | ICD-10-CM | POA: Insufficient documentation

## 2015-12-07 DIAGNOSIS — E876 Hypokalemia: Secondary | ICD-10-CM | POA: Diagnosis not present

## 2015-12-07 DIAGNOSIS — T148XXA Other injury of unspecified body region, initial encounter: Secondary | ICD-10-CM

## 2015-12-07 DIAGNOSIS — R1084 Generalized abdominal pain: Secondary | ICD-10-CM

## 2015-12-07 DIAGNOSIS — S46812A Strain of other muscles, fascia and tendons at shoulder and upper arm level, left arm, initial encounter: Secondary | ICD-10-CM | POA: Insufficient documentation

## 2015-12-07 DIAGNOSIS — S199XXA Unspecified injury of neck, initial encounter: Secondary | ICD-10-CM | POA: Diagnosis present

## 2015-12-07 DIAGNOSIS — Y999 Unspecified external cause status: Secondary | ICD-10-CM | POA: Insufficient documentation

## 2015-12-07 LAB — CBC WITH DIFFERENTIAL/PLATELET
BASOS PCT: 0 %
Basophils Absolute: 0 10*3/uL (ref 0.0–0.1)
EOS ABS: 0 10*3/uL (ref 0.0–0.7)
Eosinophils Relative: 1 %
HEMATOCRIT: 38.8 % (ref 36.0–46.0)
Hemoglobin: 13.4 g/dL (ref 12.0–15.0)
LYMPHS ABS: 2.5 10*3/uL (ref 0.7–4.0)
Lymphocytes Relative: 30 %
MCH: 29.9 pg (ref 26.0–34.0)
MCHC: 34.5 g/dL (ref 30.0–36.0)
MCV: 86.6 fL (ref 78.0–100.0)
MONO ABS: 0.7 10*3/uL (ref 0.1–1.0)
MONOS PCT: 8 %
Neutro Abs: 5.1 10*3/uL (ref 1.7–7.7)
Neutrophils Relative %: 61 %
Platelets: 258 10*3/uL (ref 150–400)
RBC: 4.48 MIL/uL (ref 3.87–5.11)
RDW: 12.7 % (ref 11.5–15.5)
WBC: 8.4 10*3/uL (ref 4.0–10.5)

## 2015-12-07 LAB — URINALYSIS, ROUTINE W REFLEX MICROSCOPIC
BILIRUBIN URINE: NEGATIVE
Glucose, UA: NEGATIVE mg/dL
HGB URINE DIPSTICK: NEGATIVE
Ketones, ur: NEGATIVE mg/dL
Leukocytes, UA: NEGATIVE
NITRITE: NEGATIVE
PROTEIN: NEGATIVE mg/dL
Specific Gravity, Urine: 1.023 (ref 1.005–1.030)
pH: 8.5 — ABNORMAL HIGH (ref 5.0–8.0)

## 2015-12-07 LAB — COMPREHENSIVE METABOLIC PANEL
ALT: 54 U/L (ref 14–54)
AST: 33 U/L (ref 15–41)
Albumin: 3.7 g/dL (ref 3.5–5.0)
Alkaline Phosphatase: 63 U/L (ref 38–126)
Anion gap: 7 (ref 5–15)
BUN: 8 mg/dL (ref 6–20)
CHLORIDE: 103 mmol/L (ref 101–111)
CO2: 28 mmol/L (ref 22–32)
Calcium: 8.8 mg/dL — ABNORMAL LOW (ref 8.9–10.3)
Creatinine, Ser: 0.56 mg/dL (ref 0.44–1.00)
Glucose, Bld: 98 mg/dL (ref 65–99)
POTASSIUM: 3.1 mmol/L — AB (ref 3.5–5.1)
SODIUM: 138 mmol/L (ref 135–145)
Total Bilirubin: 0.4 mg/dL (ref 0.3–1.2)
Total Protein: 6.8 g/dL (ref 6.5–8.1)

## 2015-12-07 LAB — PREGNANCY, URINE: PREG TEST UR: NEGATIVE

## 2015-12-07 LAB — LIPASE, BLOOD: LIPASE: 17 U/L (ref 11–51)

## 2015-12-07 MED ORDER — METHOCARBAMOL 500 MG PO TABS: 1000.0000 mg | ORAL_TABLET | Freq: Three times a day (TID) | ORAL | 0 refills | Status: DC | PRN

## 2015-12-07 MED ORDER — POTASSIUM CHLORIDE CRYS ER 20 MEQ PO TBCR
40.0000 meq | EXTENDED_RELEASE_TABLET | Freq: Once | ORAL | Status: AC
  Administered 2015-12-07: 40 meq via ORAL
  Filled 2015-12-07: qty 2

## 2015-12-07 MED ORDER — IBUPROFEN 600 MG PO TABS: 600.0000 mg | ORAL_TABLET | Freq: Four times a day (QID) | ORAL | 0 refills | Status: DC | PRN

## 2015-12-07 MED ORDER — DICYCLOMINE HCL 20 MG PO TABS: 20.0000 mg | ORAL_TABLET | Freq: Two times a day (BID) | ORAL | 0 refills | Status: DC | PRN

## 2015-12-07 MED ORDER — DICYCLOMINE HCL 10 MG PO CAPS
10.0000 mg | ORAL_CAPSULE | Freq: Once | ORAL | Status: AC
  Administered 2015-12-07: 10 mg via ORAL
  Filled 2015-12-07: qty 1

## 2015-12-07 MED FILL — METHOCARBAMOL 500 MG TABLET: 500 | 3 days supply | Qty: 20 | Fill #0

## 2015-12-07 MED FILL — IBUPROFEN 600 MG TABLET: 600 | 7 days supply | Qty: 30 | Fill #0

## 2015-12-07 MED FILL — DICYCLOMINE 20 MG TABLET: 20 | 15 days supply | Qty: 30 | Fill #0

## 2015-12-07 NOTE — ED Notes (Signed)
ED Provider at bedside. 
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
## 2015-12-29 ENCOUNTER — Encounter: Payer: Self-pay | Admitting: Gynecology

## 2015-12-29 ENCOUNTER — Ambulatory Visit (INDEPENDENT_AMBULATORY_CARE_PROVIDER_SITE_OTHER): Payer: 59 | Admitting: Gynecology

## 2015-12-29 VITALS — BP 116/74 | Ht 68.5 in | Wt 165.0 lb

## 2015-12-29 DIAGNOSIS — Z13220 Encounter for screening for lipoid disorders: Secondary | ICD-10-CM

## 2015-12-29 DIAGNOSIS — Z01419 Encounter for gynecological examination (general) (routine) without abnormal findings: Secondary | ICD-10-CM | POA: Diagnosis not present

## 2015-12-29 DIAGNOSIS — N941 Unspecified dyspareunia: Secondary | ICD-10-CM

## 2015-12-29 NOTE — Patient Instructions (Signed)
Follow up for ultrasound as scheduled 
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
## 2016-02-07 ENCOUNTER — Encounter: Payer: Self-pay | Admitting: Gynecology

## 2016-02-07 ENCOUNTER — Ambulatory Visit (INDEPENDENT_AMBULATORY_CARE_PROVIDER_SITE_OTHER): Payer: 59 | Admitting: Gynecology

## 2016-02-07 VITALS — BP 120/74

## 2016-02-07 DIAGNOSIS — N898 Other specified noninflammatory disorders of vagina: Secondary | ICD-10-CM

## 2016-02-07 DIAGNOSIS — N907 Vulvar cyst: Secondary | ICD-10-CM | POA: Diagnosis not present

## 2016-02-07 LAB — WET PREP FOR TRICH, YEAST, CLUE
Clue Cells Wet Prep HPF POC: NONE SEEN
TRICH WET PREP: NONE SEEN
Yeast Wet Prep HPF POC: NONE SEEN

## 2016-02-07 MED ORDER — FLUCONAZOLE 150 MG PO TABS: 150.0000 mg | ORAL_TABLET | Freq: Every day | ORAL | 0 refills | Status: DC

## 2016-02-07 NOTE — Progress Notes (Signed)
    Christine Duncan [birth year] [id number]        35 y.o.  [id number] presents complaining of recurrent vulvar/labial lumps that are uncomfortable. She's tried squeezing him without return of any debris. Occurs both on the right and left sides. Has noticed this over the last month or so. Has one area of it's sore now but she admits to having manipulated a lot. Also complaining of vaginal discharge with irritation and some perianal itching. No odor. No urinary symptoms such as frequency dysuria or urgency fever or chills.  Past medical history,surgical history, problem list, medications, allergies, family history and social history were all reviewed and documented in the EPIC chart.  Directed ROS with pertinent positives and negatives documented in the history of present illness/assessment and plan.  Exam: [name] assistant Vitals:   02/07/16 0940  BP: 120/74   General appearance:  Normal Abdomen soft nontender without masses guarding rebound Pelvic external BUS vagina with small sebaceous cyst type area upper inner fold of right labia minora with right labial majora. Similar smaller area on the left. No overlying skin changes to suggest ulcerations or pigmentations. No drainage. Vagina with slight white discharge. Cervix normal. Uterus normal size midline mobile nontender. Adnexa without masses or tenderness.  Assessment/Plan:  36 y.o. [id number] with small sebaceous cysts right and left labia, noninflammatory. Patient did stop her oral contraceptives several months ago and currently using condoms. We did this because of her smoking history. She is thinking of alternative methods but this time is not ready to commit. Availability of Plan B discussed with her. I think the sebaceous cysts are probably due to withdrawal of the ovarian suppression by the oral contraceptives and may be slightly higher androgen levels now. Recommended patient treat topically with warm soaks when they occur but it will avoid  significant manipulation to cause inflammation. Wet prep overall was negative. Given the itching history particular the perianal itching I'm going to cover her for yeast with Diflucan 150 mg daily 3 days. She will follow up if the symptoms persist, worsen or recur.    Anastasio Auerbach MD, 10:24 AM 02/07/2016

## 2016-02-07 NOTE — Patient Instructions (Signed)
Take the Diflucan pill daily for 3 days.  Follow-up if your symptoms persist, worsen or recur. 

## 2016-02-07 NOTE — Addendum Note (Signed)
Addended by: Nelva Nay on: 02/07/2016 11:41 AM   Modules accepted: Orders

## 2016-02-11 ENCOUNTER — Encounter: Payer: Self-pay | Admitting: Gynecology

## 2016-02-11 NOTE — Telephone Encounter (Signed)
Her wet prep overall was normal in that it did not show yeast trichomonas or clue cells. The vagina normally has many bacteria that lives there. This is what we see when we look at a wet prep and report many bacteria but this is not pathologic.

## 2016-02-11 NOTE — Telephone Encounter (Signed)
I would way through her. She still has symptoms afterwards then office visit for reevaluation

## 2016-02-11 NOTE — Telephone Encounter (Signed)
Also, you wrote "I would way through her."  What did that mean?

## 2016-02-11 NOTE — Telephone Encounter (Signed)
She asked about the wet prep results as well. I noticed Many bacteria noted and I am sure that is what she is wondering about it. What to tell her.  Also, you wrote "I would way through her."  What did that mean?

## 2016-02-11 NOTE — Telephone Encounter (Signed)
I would wait through her period. If her symptoms persist then office visit

## 2016-05-05 ENCOUNTER — Telehealth: Payer: Self-pay

## 2016-05-05 NOTE — Telephone Encounter (Signed)
Schedule IUD placement with menses

## 2016-05-05 NOTE — Telephone Encounter (Signed)
Patient called front desk stating she wanted to schedule appt for IUD.  Your 12/29/15 CE note said "Contraception. Reviewed options with her to include Nexplanon Depo-Provera IUDs and sterilization. She does not desire any future childbearing. Discussed possible diagnostic laparoscopy for her dyspareunia and if so may proceed with tubal sterilization at that time. Reviewed risks/benefits with the other options. She is not interested in the IUD but is interested Nexplanon. Issues of irregular bleeding with Nexplanon discussed. Will further review after ultrasound."  Patient said she no longer needs to have u/s because pain resolved.  How to proceed as far as her wanting IUD?

## 2016-05-05 NOTE — Telephone Encounter (Signed)
yes

## 2016-05-05 NOTE — Telephone Encounter (Signed)
Will this be Mirena?

## 2016-05-08 NOTE — Telephone Encounter (Signed)
Claudia informed. She will call and schedule patient.

## 2016-05-09 ENCOUNTER — Encounter: Payer: Self-pay | Admitting: Gynecology

## 2016-05-09 ENCOUNTER — Ambulatory Visit (INDEPENDENT_AMBULATORY_CARE_PROVIDER_SITE_OTHER): Payer: 59 | Admitting: Gynecology

## 2016-05-09 VITALS — BP 118/74

## 2016-05-09 DIAGNOSIS — Z30430 Encounter for insertion of intrauterine contraceptive device: Secondary | ICD-10-CM

## 2016-05-09 HISTORY — PX: INTRAUTERINE DEVICE INSERTION: SHX323

## 2016-05-09 NOTE — Progress Notes (Signed)
    Christine Duncan 22-Apr-1980 [id number]        36 y.o.  [id number]  presents for Mirena IUD placement. She has read through the booklet, has no contraindications and signed the consent form. She currently is on a normal menses.  I reviewed the insertional process with her as well as the risks to include infection, either immediate or long-term, uterine perforation or migration requiring surgery to remove, other complications such as pain, hormonal side effects, infertility and possibility of failure with subsequent pregnancy.   Exam with [name] assistant Vitals:   05/09/16 1224  BP: 118/74    Pelvic: External BUS vagina normal. Cervix with ectropion and light menses flow. Uterus anteverted normal size shape contour midline mobile nontender. Adnexa without masses or tenderness.  Procedure: The cervix was cleansed with Betadine, anterior lip grasped with a single-tooth tenaculum, the uterus was sounded and a Mirena IUD was placed according to manufacturer's recommendations without difficulty. The strings were trimmed. The patient tolerated well and will follow up in one month for a postinsertional check.  Lot number:  Laurance Flatten MD, 12:48 PM 05/09/2016

## 2016-05-09 NOTE — Patient Instructions (Signed)

## 2016-05-10 ENCOUNTER — Encounter: Payer: Self-pay | Admitting: Gynecology

## 2016-06-14 ENCOUNTER — Ambulatory Visit (INDEPENDENT_AMBULATORY_CARE_PROVIDER_SITE_OTHER): Payer: 59 | Admitting: Gynecology

## 2016-06-14 ENCOUNTER — Encounter: Payer: Self-pay | Admitting: Gynecology

## 2016-06-14 VITALS — BP 118/74

## 2016-06-14 DIAGNOSIS — N920 Excessive and frequent menstruation with regular cycle: Secondary | ICD-10-CM | POA: Diagnosis not present

## 2016-06-14 DIAGNOSIS — Z30431 Encounter for routine checking of intrauterine contraceptive device: Secondary | ICD-10-CM | POA: Diagnosis not present

## 2016-06-14 MED ORDER — MEGESTROL ACETATE 20 MG PO TABS: 20.0000 mg | ORAL_TABLET | Freq: Every day | ORAL | 0 refills | Status: DC

## 2016-06-14 NOTE — Progress Notes (Signed)
    Christine Duncan Nov 28, 1980 [id number]        36 y.o.  [id number] follows up having had a Mirena IUD placed 05/09/2016. Has bled on and off almost on a daily basis. Ranges from light spotting to a light flow. Also noting some bloating and increased appetite. No significant pain.  Past medical history,surgical history, problem list, medications, allergies, family history and social history were all reviewed and documented in the EPIC chart.  Directed ROS with pertinent positives and negatives documented in the history of present illness/assessment and plan.  Exam: Christine Duncan assistant Vitals:   06/14/16 1219  BP: 118/74   General appearance:  Normal Abdomen soft nontender without masses guarding rebound Pelvic external BUS vagina with slight staining. Cervix grossly normal with IUD string visualized an appropriate length. Uterus anteverted normal size midline mobile nontender. Adnexa without masses or tenderness.  Assessment/Plan:  36 y.o. [name] with persistent spotting with IUD. Also having some probable progesterone side effects to include bloating and increased appetite. Recommend Megace 20 mg daily 14 days to see if we cannot thin out the endometrium and eradicate her bleeding. Patient will follow up if bleeding continues or other symptoms persist. Otherwise if all symptoms resolved and she is doing well she'll follow up in December when due for her annual exam.    Christine Auerbach MD, 12:39 PM 06/14/2016

## 2016-06-14 NOTE — Patient Instructions (Signed)
Take the progesterone pill daily for 2 weeks. Follow up if the irregular bleeding continues.

## 2016-06-28 ENCOUNTER — Encounter: Payer: Self-pay | Admitting: Internal Medicine

## 2016-06-28 ENCOUNTER — Ambulatory Visit (INDEPENDENT_AMBULATORY_CARE_PROVIDER_SITE_OTHER): Payer: 59 | Admitting: Internal Medicine

## 2016-06-28 DIAGNOSIS — I10 Essential (primary) hypertension: Secondary | ICD-10-CM

## 2016-06-28 DIAGNOSIS — J039 Acute tonsillitis, unspecified: Secondary | ICD-10-CM | POA: Insufficient documentation

## 2016-06-28 MED ORDER — AZITHROMYCIN 250 MG PO TABS
ORAL_TABLET | ORAL | 1 refills | Status: DC
Start: 2016-06-28 — End: 2016-08-01

## 2016-06-28 MED ORDER — FLUCONAZOLE 150 MG PO TABS: ORAL_TABLET | ORAL | 1 refills | Status: DC

## 2016-06-28 NOTE — Assessment & Plan Note (Signed)
Cant r/o strep, Mild to mod, for antibx course,  to f/u any worsening symptoms or concerns

## 2016-06-28 NOTE — Patient Instructions (Signed)
Please take all new medication as prescribed - the antibiotic, and diflucan if needed  Please continue all other medications as before, and refills have been done if requested.  Please have the pharmacy call with any other refills you may need.  Please keep your appointments with your specialists as you may have planned

## 2016-06-28 NOTE — Progress Notes (Signed)
Subjective:    Patient ID: Christine Duncan, female    DOB: 01/10/1981, 36 y.o.   MRN: [id number]  HPI   Here with 2-3 days acute onset fever, severe ST pain, pressure, headache, general weakness and malaise, and minor non prod cough, but pt denies chest pain, wheezing, increased sob or doe, orthopnea, PND, increased LE swelling, palpitations, dizziness or syncope.  Believes she has seen white exucate on tonsils.  No sick contacts. Pt denies new neurological symptoms such as new headache, or facial or extremity weakness or numbness   Pt denies polydipsia, polyuria Past Medical History:  Diagnosis Date  . Acid reflux   . Allergy   . Anemia   . Anxiety   . B12 deficiency   . Bipolar 1 disorder ([place])   . Blood type, Rh negative   . Cervical dysplasia   . Depression   . Essential tremor   . Fibromyalgia   . Hives   . IBS (irritable bowel syndrome)   . Neuromuscular disorder ([place])    fibromylagia  . Spastic colon    Past Surgical History:  Procedure Laterality Date  . CERVICAL BIOPSY  W/ LOOP ELECTRODE EXCISION    . CESAREAN SECTION  2006  . CESAREAN SECTION  2011  . COLPOSCOPY    . DILATION AND CURETTAGE OF UTERUS  2005  . INTRAUTERINE DEVICE INSERTION  05/09/2016  . LEEP  07/2010   CIN II, Margins free    reports that she has been smoking Cigarettes.  She has been smoking about 1.00 pack per day. She has never used smokeless tobacco. She reports that she drinks alcohol. She reports that she does not use drugs. family history includes Alcohol abuse in her maternal grandfather; Bipolar disorder in her maternal grandfather and paternal grandmother; Breast cancer (age of onset: 59) in her paternal grandmother; Cancer in her paternal grandmother; Cancer (age of onset: 35) in her father; Colon cancer in her paternal grandmother; Depression in her maternal grandmother and mother; Diabetes in her paternal grandmother; Drug abuse in her maternal grandfather; Esophageal cancer in her father;  Hyperlipidemia in her paternal grandmother; Seizures in her maternal grandmother; Thyroid disease in her maternal grandmother and mother. Allergies  Allergen Reactions  . Carbidopa-Levodopa     REACTION: nausea  . Sertraline Hcl     REACTION: Mania   Current Outpatient Prescriptions on File Prior to Visit  Medication Sig Dispense Refill  . clonazePAM (KLONOPIN) 0.5 MG disintegrating tablet DISSOLVE 2 TABLETS ON THE TONGUE 3 TIMES A DAY AS NEEDED  0  . Cyanocobalamin 1000 MCG SUBL Place 1 tablet (1,000 mcg total) under the tongue daily. 100 tablet 3  . cyclobenzaprine (FLEXERIL) 5 MG tablet Take 1 tablet (5 mg total) by mouth 3 (three) times daily as needed for muscle spasms. 270 tablet 2  . dicyclomine (BENTYL) 20 MG tablet Take 1 tablet (20 mg total) by mouth 2 (two) times daily as needed for spasms. 30 tablet 0  . gabapentin (NEURONTIN) 300 MG capsule Take 300 mg by mouth 3 (three) times daily.  0  . HYDROcodone-acetaminophen (NORCO) 7.5-325 MG tablet Take 1 tablet by mouth 4 (four) times daily as needed. 60 tablet 0  . hydrOXYzine (ATARAX/VISTARIL) 25 MG tablet Take 1 tablet (25 mg total) by mouth every 8 (eight) hours as needed for itching. 60 tablet 0  . ibuprofen (ADVIL,MOTRIN) 600 MG tablet Take 1 tablet (600 mg total) by mouth every 6 (six) hours as needed. 30 tablet 0  .  lamoTRIgine (LAMICTAL) 100 MG tablet Take 100 mg by mouth daily.    Marland Kitchen loratadine (CLARITIN) 10 MG tablet Take 1 tablet (10 mg total) by mouth daily. 100 tablet 3  . megestrol (MEGACE) 20 MG tablet Take 1 tablet (20 mg total) by mouth daily. 14 tablet 0  . omeprazole (PRILOSEC) 20 MG capsule Take 2 capsules by mouth daily. Please disregard last RX sent over signed by Dr Linda Hedges 180 capsule 3  . phentermine (ADIPEX-P) 37.5 MG tablet Take 37.5 mg by mouth every morning.  2   No current facility-administered medications on file prior to visit.    Review of Systems All otherwise neg per pt    Objective:   Physical  Exam BP 124/84   Pulse 91   Temp 98.9 F (37.2 C) (Oral)   Ht 5' 8.5" (1.74 m)   Wt 157 lb (71.2 kg)   SpO2 100%   BMI 23.52 kg/m  VS noted, mild ill appaering Constitutional: Pt appears in NAD HENT: Head: NCAT.  Right Ear: External ear normal.  Left Ear: External ear normal.  Eyes: . Pupils are equal, round, and reactive to light. Conjunctivae and EOM are normal Bilat tm's with mild erythema.  Max sinus areas non tender.  Pharynx with mild erythema, + exudate Nose: without d/c or deformity Neck: Neck supple. Gross normal ROM but with large bilat submandibular LA Cardiovascular: Normal rate and regular rhythm.   Pulmonary/Chest: Effort normal and breath sounds without rales or wheezing.  Neurological: Pt is alert. At baseline orientation, motor grossly intact Skin: Skin is warm. No rashes, other new lesions, no LE edema Psychiatric: Pt behavior is normal without agitation  No other exam findings       Assessment & Plan:

## 2016-06-28 NOTE — Assessment & Plan Note (Signed)
stable overall by history and exam, recent data reviewed with pt, and pt to continue medical treatment as before,  to f/u any worsening symptoms or concerns BP Readings from Last 3 Encounters:  06/28/16 124/84  06/14/16 118/74  05/09/16 118/74

## 2016-08-01 ENCOUNTER — Encounter: Payer: Self-pay | Admitting: Internal Medicine

## 2016-08-01 ENCOUNTER — Ambulatory Visit (INDEPENDENT_AMBULATORY_CARE_PROVIDER_SITE_OTHER): Payer: 59 | Admitting: Internal Medicine

## 2016-08-01 ENCOUNTER — Other Ambulatory Visit (INDEPENDENT_AMBULATORY_CARE_PROVIDER_SITE_OTHER): Payer: 59

## 2016-08-01 VITALS — BP 118/72 | HR 88 | Temp 98.1°F | Ht 68.5 in | Wt 155.0 lb

## 2016-08-01 DIAGNOSIS — B079 Viral wart, unspecified: Secondary | ICD-10-CM

## 2016-08-01 DIAGNOSIS — E785 Hyperlipidemia, unspecified: Secondary | ICD-10-CM

## 2016-08-01 DIAGNOSIS — Z720 Tobacco use: Secondary | ICD-10-CM | POA: Diagnosis not present

## 2016-08-01 DIAGNOSIS — M797 Fibromyalgia: Secondary | ICD-10-CM | POA: Diagnosis not present

## 2016-08-01 DIAGNOSIS — Z0000 Encounter for general adult medical examination without abnormal findings: Secondary | ICD-10-CM | POA: Diagnosis not present

## 2016-08-01 DIAGNOSIS — Z23 Encounter for immunization: Secondary | ICD-10-CM

## 2016-08-01 DIAGNOSIS — I10 Essential (primary) hypertension: Secondary | ICD-10-CM

## 2016-08-01 DIAGNOSIS — K219 Gastro-esophageal reflux disease without esophagitis: Secondary | ICD-10-CM

## 2016-08-01 LAB — CBC WITH DIFFERENTIAL/PLATELET
BASOS PCT: 0.7 % (ref 0.0–3.0)
Basophils Absolute: 0.1 10*3/uL (ref 0.0–0.1)
EOS PCT: 0.9 % (ref 0.0–5.0)
Eosinophils Absolute: 0.1 10*3/uL (ref 0.0–0.7)
HEMATOCRIT: 42.1 % (ref 36.0–46.0)
Hemoglobin: 14.6 g/dL (ref 12.0–15.0)
LYMPHS ABS: 2.4 10*3/uL (ref 0.7–4.0)
Lymphocytes Relative: 30.4 % (ref 12.0–46.0)
MCHC: 34.7 g/dL (ref 30.0–36.0)
MCV: 88.1 fl (ref 78.0–100.0)
MONOS PCT: 7.1 % (ref 3.0–12.0)
Monocytes Absolute: 0.6 10*3/uL (ref 0.1–1.0)
NEUTROS ABS: 4.9 10*3/uL (ref 1.4–7.7)
NEUTROS PCT: 60.9 % (ref 43.0–77.0)
PLATELETS: 265 10*3/uL (ref 150.0–400.0)
RBC: 4.78 Mil/uL (ref 3.87–5.11)
RDW: 13.5 % (ref 11.5–15.5)
WBC: 8 10*3/uL (ref 4.0–10.5)

## 2016-08-01 LAB — BASIC METABOLIC PANEL
BUN: 8 mg/dL (ref 6–23)
CALCIUM: 9.1 mg/dL (ref 8.4–10.5)
CO2: 27 meq/L (ref 19–32)
CREATININE: 0.68 mg/dL (ref 0.40–1.20)
Chloride: 106 mEq/L (ref 96–112)
GFR: 102.86 mL/min (ref >60.00)
Glucose, Bld: 97 mg/dL (ref 70–99)
Potassium: 4.3 mEq/L (ref 3.5–5.1)
Sodium: 139 mEq/L (ref 135–145)

## 2016-08-01 LAB — URINALYSIS
BILIRUBIN URINE: NEGATIVE
Ketones, ur: NEGATIVE
Leukocytes, UA: NEGATIVE
Nitrite: NEGATIVE
Specific Gravity, Urine: >=1.03 — AB (ref 1.000–1.030)
TOTAL PROTEIN, URINE-UPE24: NEGATIVE
URINE GLUCOSE: NEGATIVE
UROBILINOGEN UA: 0.2 (ref 0.0–1.0)
pH: 6 (ref 5.0–8.0)

## 2016-08-01 LAB — TSH: TSH: 0.52 u[IU]/mL (ref 0.35–4.50)

## 2016-08-01 LAB — LIPID PANEL
Cholesterol: 206 mg/dL — ABNORMAL HIGH (ref 0–200)
HDL: 45.8 mg/dL (ref >39.00)
LDL Cholesterol: 150 mg/dL — ABNORMAL HIGH (ref 0–99)
NonHDL: 160.21
Total CHOL/HDL Ratio: 4
Triglycerides: 53 mg/dL (ref 0.0–149.0)
VLDL: 10.6 mg/dL (ref 0.0–40.0)

## 2016-08-01 LAB — HEPATIC FUNCTION PANEL
ALK PHOS: 59 U/L (ref 39–117)
ALT: 14 U/L (ref 0–35)
AST: 14 U/L (ref 0–37)
Albumin: 4 g/dL (ref 3.5–5.2)
BILIRUBIN DIRECT: 0.2 mg/dL (ref 0.0–0.3)
BILIRUBIN TOTAL: 0.5 mg/dL (ref 0.2–1.2)
Total Protein: 6.7 g/dL (ref 6.0–8.3)

## 2016-08-01 LAB — VITAMIN B12: VITAMIN B 12: 220 pg/mL (ref 211–911)

## 2016-08-01 MED ORDER — ROSUVASTATIN CALCIUM 10 MG PO TABS
10.0000 mg | ORAL_TABLET | Freq: Every day | ORAL | 11 refills | Status: DC
Start: 2016-08-01 — End: 2016-09-27

## 2016-08-01 MED ORDER — CIMETIDINE 400 MG PO TABS: 400.0000 mg | ORAL_TABLET | Freq: Every day | ORAL | 11 refills | Status: DC

## 2016-08-01 NOTE — Assessment & Plan Note (Signed)
Off Rx 

## 2016-08-01 NOTE — Assessment & Plan Note (Signed)
Dr Henrene Pastor EGD nl 2013 Omeprazole 40 mg/d

## 2016-08-01 NOTE — Progress Notes (Signed)
Subjective:  Patient ID: Christine Duncan, female    DOB: 1980/06/19  Age: 36 y.o. MRN: [id number]  CC: No chief complaint on file.   HPI Christine Duncan presents for a well exam C/o elev lipids in the pst  Outpatient Medications Prior to Visit  Medication Sig Dispense Refill  . clonazePAM (KLONOPIN) 0.5 MG disintegrating tablet DISSOLVE 2 TABLETS ON THE TONGUE 3 TIMES A DAY AS NEEDED  0  . Cyanocobalamin 1000 MCG SUBL Place 1 tablet (1,000 mcg total) under the tongue daily. 100 tablet 3  . cyclobenzaprine (FLEXERIL) 5 MG tablet Take 1 tablet (5 mg total) by mouth 3 (three) times daily as needed for muscle spasms. 270 tablet 2  . dicyclomine (BENTYL) 20 MG tablet Take 1 tablet (20 mg total) by mouth 2 (two) times daily as needed for spasms. 30 tablet 0  . gabapentin (NEURONTIN) 300 MG capsule Take 300 mg by mouth 3 (three) times daily.  0  . HYDROcodone-acetaminophen (NORCO) 7.5-325 MG tablet Take 1 tablet by mouth 4 (four) times daily as needed. 60 tablet 0  . hydrOXYzine (ATARAX/VISTARIL) 25 MG tablet Take 1 tablet (25 mg total) by mouth every 8 (eight) hours as needed for itching. 60 tablet 0  . lamoTRIgine (LAMICTAL) 100 MG tablet Take 100 mg by mouth daily.    Marland Kitchen omeprazole (PRILOSEC) 20 MG capsule Take 2 capsules by mouth daily. Please disregard last RX sent over signed by Christine Duncan 180 capsule 3  . azithromycin (ZITHROMAX Z-PAK) 250 MG tablet 2 tab by mouth day 1, then 1 per day 6 tablet 1  . fluconazole (DIFLUCAN) 150 MG tablet 1 tab every 3 days as needed 2 tablet 1  . ibuprofen (ADVIL,MOTRIN) 600 MG tablet Take 1 tablet (600 mg total) by mouth every 6 (six) hours as needed. 30 tablet 0  . loratadine (CLARITIN) 10 MG tablet Take 1 tablet (10 mg total) by mouth daily. 100 tablet 3  . megestrol (MEGACE) 20 MG tablet Take 1 tablet (20 mg total) by mouth daily. 14 tablet 0  . phentermine (ADIPEX-P) 37.5 MG tablet Take 37.5 mg by mouth every morning.  2   No facility-administered  medications prior to visit.     ROS Review of Systems  Constitutional: Negative for activity change, appetite change, chills, fatigue and unexpected weight change.  HENT: Negative for congestion, mouth sores and sinus pressure.   Eyes: Negative for visual disturbance.  Respiratory: Negative for cough and chest tightness.   Gastrointestinal: Negative for abdominal pain and nausea.  Genitourinary: Negative for difficulty urinating, frequency and vaginal pain.  Musculoskeletal: Negative for back pain and gait problem.  Skin: Negative for pallor and rash.  Neurological: Negative for dizziness, tremors, weakness, numbness and headaches.  Psychiatric/Behavioral: Negative for confusion, sleep disturbance and suicidal ideas.    Objective:  BP 118/72 (BP Location: Left Arm, Patient Position: Sitting, Cuff Size: Normal)   Pulse 88   Temp 98.1 F (36.7 C) (Oral)   Ht 5' 8.5" (1.74 m)   Wt 155 lb (70.3 kg)   SpO2 100%   BMI 23.22 kg/m   BP Readings from Last 3 Encounters:  08/01/16 118/72  06/28/16 124/84  06/14/16 118/74    Wt Readings from Last 3 Encounters:  08/01/16 155 lb (70.3 kg)  06/28/16 157 lb (71.2 kg)  12/29/15 165 lb (74.8 kg)    Physical Exam  Constitutional: She appears well-developed. No distress.  HENT:  Head: Normocephalic.  Right Ear: External ear  normal.  Left Ear: External ear normal.  Nose: Nose normal.  Mouth/Throat: Oropharynx is clear and moist.  Eyes: Pupils are equal, round, and reactive to light. Conjunctivae are normal. Right eye exhibits no discharge. Left eye exhibits no discharge.  Neck: Normal range of motion. Neck supple. No JVD present. No tracheal deviation present. No thyromegaly present.  Cardiovascular: Normal rate, regular rhythm and normal heart sounds.   Pulmonary/Chest: No stridor. No respiratory distress. She has no wheezes.  Abdominal: Soft. Bowel sounds are normal. She exhibits no distension and no mass. There is no tenderness.  There is no rebound and no guarding.  Musculoskeletal: She exhibits no edema or tenderness.  Lymphadenopathy:    She has no cervical adenopathy.  Neurological: She displays normal reflexes. No cranial nerve deficit. She exhibits normal muscle tone. Coordination normal.  Skin: No rash noted. No erythema.  Psychiatric: She has a normal mood and affect. Her behavior is normal. Judgment and thought content normal.    Lab Results  Component Value Date   WBC 8.4 12/07/2015   HGB 13.4 12/07/2015   HCT 38.8 12/07/2015   PLT 258 12/07/2015   GLUCOSE 98 12/07/2015   CHOL 281 (H) 11/07/2012   TRIG 263 (H) 11/07/2012   HDL 34 (L) 11/07/2012   LDLCALC 194 (H) 11/07/2012   ALT 54 12/07/2015   AST 33 12/07/2015   NA 138 12/07/2015   K 3.1 (L) 12/07/2015   CL 103 12/07/2015   CREATININE 0.56 12/07/2015   BUN 8 12/07/2015   CO2 28 12/07/2015   TSH 1.399 01/02/2013    Dg Abd Acute W/chest  Result Date: 12/07/2015 CLINICAL DATA:  Abdominal distention and pain for several days, initial encounter EXAM: DG ABDOMEN ACUTE W/ 1V CHEST COMPARISON:  None. FINDINGS: Cardiac shadow is within normal limits. The lungs are clear bilaterally. Scattered large and small bowel gas is noted. No free air is seen. A few small nonobstructing right renal calculi are noted measuring approximately 5 mm. No definitive ureteral calculi are seen. No bony abnormality is noted. IMPRESSION: Nonobstructing right renal stones. No other focal abnormality is noted. Electronically Signed   By: Christine Duncan M.D.   On: 12/07/2015 11:10    Assessment & Plan:   There are no diagnoses linked to this encounter. I have discontinued Christine Duncan's loratadine, phentermine, ibuprofen, megestrol, azithromycin, and fluconazole. I am also having her maintain her Cyanocobalamin, omeprazole, cyclobenzaprine, clonazePAM, gabapentin, lamoTRIgine, hydrOXYzine, HYDROcodone-acetaminophen, dicyclomine, ADDERALL XR, amphetamine-dextroamphetamine, and  Cetirizine HCl (ZYRTEC ALLERGY PO).  Meds ordered this encounter  Medications  . ADDERALL XR 20 MG 24 hr capsule    Sig: Take 20 mg by mouth every morning.    Refill:  0  . amphetamine-dextroamphetamine (ADDERALL) 20 MG tablet    Sig: Take 20 mg by mouth daily.    Refill:  0  . Cetirizine HCl (ZYRTEC ALLERGY PO)    Sig: Take by mouth.     Follow-up: No Follow-up on file.  Walker Kehr, MD

## 2016-08-01 NOTE — Assessment & Plan Note (Signed)
B LE 7/18 try Cimetidine

## 2016-08-01 NOTE — Assessment & Plan Note (Signed)
We discussed age appropriate health related issues, including available/recomended screening tests and vaccinations. We discussed a need for adhering to healthy diet and exercise. Labs were ordered to be later reviewed . All questions were answered. [facility]

## 2016-08-01 NOTE — Addendum Note (Signed)
Addended by: Karren Cobble on: 08/01/2016 09:16 AM   Modules accepted: Orders

## 2016-08-01 NOTE — Assessment & Plan Note (Signed)
Discussed.

## 2016-08-01 NOTE — Assessment & Plan Note (Addendum)
Labs Crestor Smoking discussed

## 2016-08-18 ENCOUNTER — Encounter (HOSPITAL_COMMUNITY): Payer: Self-pay | Admitting: Emergency Medicine

## 2016-08-18 ENCOUNTER — Emergency Department (HOSPITAL_COMMUNITY): Payer: 59

## 2016-08-18 ENCOUNTER — Observation Stay (HOSPITAL_COMMUNITY)
Admission: EM | Admit: 2016-08-18 | Discharge: 2016-08-20 | Disposition: A | Discharge status: Home / Self Care | Payer: 59 | Attending: Surgery | Admitting: Surgery

## 2016-08-18 DIAGNOSIS — F17200 Nicotine dependence, unspecified, uncomplicated: Secondary | ICD-10-CM | POA: Diagnosis not present

## 2016-08-18 DIAGNOSIS — Z79899 Other long term (current) drug therapy: Secondary | ICD-10-CM | POA: Insufficient documentation

## 2016-08-18 DIAGNOSIS — F319 Bipolar disorder, unspecified: Secondary | ICD-10-CM | POA: Insufficient documentation

## 2016-08-18 DIAGNOSIS — F419 Anxiety disorder, unspecified: Secondary | ICD-10-CM | POA: Diagnosis not present

## 2016-08-18 DIAGNOSIS — K353 Acute appendicitis with localized peritonitis, without perforation or gangrene: Secondary | ICD-10-CM

## 2016-08-18 DIAGNOSIS — M797 Fibromyalgia: Secondary | ICD-10-CM | POA: Diagnosis not present

## 2016-08-18 DIAGNOSIS — K37 Unspecified appendicitis: Secondary | ICD-10-CM | POA: Diagnosis not present

## 2016-08-18 DIAGNOSIS — K3580 Unspecified acute appendicitis: Secondary | ICD-10-CM | POA: Diagnosis present

## 2016-08-18 DIAGNOSIS — K589 Irritable bowel syndrome without diarrhea: Secondary | ICD-10-CM | POA: Diagnosis not present

## 2016-08-18 LAB — COMPREHENSIVE METABOLIC PANEL
ALBUMIN: 4.2 g/dL (ref 3.5–5.0)
ALT: 15 U/L (ref 14–54)
AST: 16 U/L (ref 15–41)
Alkaline Phosphatase: 61 U/L (ref 38–126)
Anion gap: 10 (ref 5–15)
BILIRUBIN TOTAL: 0.7 mg/dL (ref 0.3–1.2)
BUN: 10 mg/dL (ref 6–20)
CHLORIDE: 101 mmol/L (ref 101–111)
CO2: 25 mmol/L (ref 22–32)
Calcium: 9.1 mg/dL (ref 8.9–10.3)
Creatinine, Ser: 0.79 mg/dL (ref 0.44–1.00)
GFR calc Af Amer: >60 mL/min (ref >60)
GFR calc non Af Amer: >60 mL/min (ref >60)
GLUCOSE: 112 mg/dL — AB (ref 65–99)
POTASSIUM: 3.8 mmol/L (ref 3.5–5.1)
Sodium: 136 mmol/L (ref 135–145)
Total Protein: 7.6 g/dL (ref 6.5–8.1)

## 2016-08-18 LAB — URINALYSIS, ROUTINE W REFLEX MICROSCOPIC
Bacteria, UA: NONE SEEN
Bilirubin Urine: NEGATIVE
GLUCOSE, UA: NEGATIVE mg/dL
KETONES UR: 20 mg/dL — AB
Leukocytes, UA: NEGATIVE
Nitrite: NEGATIVE
PH: 5 (ref 5.0–8.0)
Protein, ur: NEGATIVE mg/dL
Specific Gravity, Urine: 1.026 (ref 1.005–1.030)

## 2016-08-18 LAB — CBC
HEMATOCRIT: 42.3 % (ref 36.0–46.0)
Hemoglobin: 15 g/dL (ref 12.0–15.0)
MCH: 30.7 pg (ref 26.0–34.0)
MCHC: 35.5 g/dL (ref 30.0–36.0)
MCV: 86.7 fL (ref 78.0–100.0)
Platelets: 308 10*3/uL (ref 150–400)
RBC: 4.88 MIL/uL (ref 3.87–5.11)
RDW: 13 % (ref 11.5–15.5)
WBC: 13.8 10*3/uL — ABNORMAL HIGH (ref 4.0–10.5)

## 2016-08-18 LAB — LIPASE, BLOOD: LIPASE: 13 U/L (ref 11–51)

## 2016-08-18 LAB — I-STAT BETA HCG BLOOD, ED (MC, WL, AP ONLY): I-stat hCG, quantitative: <5 m[IU]/mL (ref <5)

## 2016-08-18 MED ORDER — ONDANSETRON HCL 4 MG/2ML IJ SOLN
4.0000 mg | Freq: Once | INTRAMUSCULAR | Status: AC
  Administered 2016-08-18: 4 mg via INTRAVENOUS
  Filled 2016-08-18: qty 2

## 2016-08-18 MED ORDER — SODIUM CHLORIDE 0.9 % IV BOLUS (SEPSIS)
1000.0000 mL | Freq: Once | INTRAVENOUS | Status: AC
  Administered 2016-08-18: 1000 mL via INTRAVENOUS

## 2016-08-18 MED ORDER — MORPHINE SULFATE (PF) 4 MG/ML IV SOLN
4.0000 mg | Freq: Once | INTRAVENOUS | Status: AC
  Administered 2016-08-18: 4 mg via INTRAVENOUS
  Filled 2016-08-18: qty 1

## 2016-08-18 MED ORDER — IOPAMIDOL (ISOVUE-300) INJECTION 61%
100.0000 mL | Freq: Once | INTRAVENOUS | Status: AC | PRN
  Administered 2016-08-19: 100 mL via INTRAVENOUS

## 2016-08-18 MED ORDER — SODIUM CHLORIDE 0.9 % IJ SOLN
INTRAMUSCULAR | Status: AC
  Filled 2016-08-18: qty 50

## 2016-08-18 MED ORDER — IOPAMIDOL (ISOVUE-300) INJECTION 61%
INTRAVENOUS | Status: AC
  Filled 2016-08-18: qty 100

## 2016-08-18 NOTE — ED Provider Notes (Signed)
TIME SEEN: 11:39 PM  CHIEF COMPLAINT: Abdominal pain  HPI: Patient is a 36 year old female with history of IBS, fibromyalgia, bipolar disorder who presents emergency department with diffuse crampy abdominal pain. States symptoms started 2 days ago. She reports she has had associated nausea but no vomiting. No diarrhea. Reports she has had chills and has been sweating but no documented fever. She has had 2 normal bowel movements a day and she states they were not hard. She does not feel she is constipated. She states that she begins to have abdominal cramping and feels that she has to have a bowel movement because the bathroom and cannot have one. She has never had similar symptoms. She had an IUD placed in April and has had intermittent vaginal bleeding since. She is being followed for an OB/GYN for this. No vaginal discharge, dysuria, hematuria. She is status post 2 C-sections. She has tried Gas-X and probiotics at home without any relief. No sick contacts. No recent travel.  ROS: See HPI Constitutional: no fever  Eyes: no drainage  ENT: no runny nose   Cardiovascular:  no chest pain  Resp: no SOB  GI: no vomiting GU: no dysuria Integumentary: no rash  Allergy: no hives  Musculoskeletal: no leg swelling  Neurological: no slurred speech ROS otherwise negative  PAST MEDICAL HISTORY/PAST SURGICAL HISTORY:  Past Medical History:  Diagnosis Date  . Acid reflux   . Allergy   . Anemia   . Anxiety   . B12 deficiency   . Bipolar 1 disorder ([place])   . Blood type, Rh negative   . Cervical dysplasia   . Depression   . Essential tremor   . Fibromyalgia   . Hives   . IBS (irritable bowel syndrome)   . Neuromuscular disorder ([place])    fibromylagia  . Spastic colon     MEDICATIONS:  Prior to Admission medications   Medication Sig Start Date End Date Taking? Authorizing Provider  ADDERALL XR 20 MG 24 hr capsule Take 20 mg by mouth every morning. 07/03/16   [provider]   amphetamine-dextroamphetamine (ADDERALL) 20 MG tablet Take 20 mg by mouth daily. 07/03/16   [provider]  Cetirizine HCl (ZYRTEC ALLERGY PO) Take by mouth.    [provider]  cimetidine (TAGAMET) 400 MG tablet Take 1 tablet (400 mg total) by mouth at bedtime. 08/01/16   Plotnikov, Evie Lacks, MD  clonazePAM (KLONOPIN) 0.5 MG disintegrating tablet DISSOLVE 2 TABLETS ON THE TONGUE 3 TIMES A DAY AS NEEDED 07/15/14   [provider]  Cyanocobalamin 1000 MCG SUBL Place 1 tablet (1,000 mcg total) under the tongue daily. 04/30/12   Plotnikov, Evie Lacks, MD  cyclobenzaprine (FLEXERIL) 5 MG tablet Take 1 tablet (5 mg total) by mouth 3 (three) times daily as needed for muscle spasms. 12/04/13   Plotnikov, Evie Lacks, MD  dicyclomine (BENTYL) 20 MG tablet Take 1 tablet (20 mg total) by mouth 2 (two) times daily as needed for spasms. 12/07/15   Julianne Rice, MD  gabapentin (NEURONTIN) 300 MG capsule Take 300 mg by mouth 3 (three) times daily. 07/15/14   [provider]  lamoTRIgine (LAMICTAL) 100 MG tablet Take 100 mg by mouth daily.    [provider]  omeprazole (PRILOSEC) 20 MG capsule Take 2 capsules by mouth daily. Please disregard last RX sent over signed by Dr Linda Hedges 02/27/13   Plotnikov, Evie Lacks, MD  rosuvastatin (CRESTOR) 10 MG tablet Take 1 tablet (10 mg total) by mouth  daily. 08/01/16   Plotnikov, Evie Lacks, MD    ALLERGIES:  Allergies  Allergen Reactions  . Carbidopa-Levodopa     REACTION: nausea  . Sertraline Hcl     REACTION: Mania    SOCIAL HISTORY:  Social History  Substance Use Topics  . Smoking status: Current Every Day Smoker    Packs/day: 1.00    Types: Cigarettes  . Smokeless tobacco: Never Used  . Alcohol use 0.0 oz/week     Comment: Rare    FAMILY HISTORY: Family History  Problem Relation Age of Onset  . Thyroid disease Mother   . Depression Mother   . Cancer Father 78       esoph ca  . Esophageal cancer Father   .  Hyperlipidemia Paternal Grandmother   . Diabetes Paternal Grandmother   . Breast cancer Paternal Grandmother 43  . Cancer Paternal Grandmother        Bladder and colon cancer  . Colon cancer Paternal Grandmother   . Bipolar disorder Paternal Grandmother   . Stroke Paternal Grandmother   . Seizures Maternal Grandmother   . Thyroid disease Maternal Grandmother   . Depression Maternal Grandmother   . Bipolar disorder Maternal Grandfather   . Alcohol abuse Maternal Grandfather   . Drug abuse Maternal Grandfather   . Rectal cancer Neg Hx   . Stomach cancer Neg Hx     EXAM: BP 113/77 (BP Location: Left Arm)   Pulse 73   Temp 98.4 F (36.9 C) (Oral)   Resp 15   Ht 5\' 8"  (1.727 m)   Wt 68.4 kg (150 lb 14.4 oz)   SpO2 99%   BMI 22.94 kg/m  CONSTITUTIONAL: Alert and oriented and responds appropriately to questions.Afebrile and nontoxic, appears uncomfortable HEAD: Normocephalic EYES: Conjunctivae clear, pupils appear equal, EOMI ENT: normal nose; moist mucous membranes NECK: Supple, no meningismus, no nuchal rigidity, no LAD  CARD: RRR; S1 and S2 appreciated; no murmurs, no clicks, no rubs, no gallops RESP: Normal chest excursion without splinting or tachypnea; breath sounds clear and equal bilaterally; no wheezes, no rhonchi, no rales, no hypoxia or respiratory distress, speaking full sentences ABD/GI: Normal bowel sounds; non-distended; soft, diffusely tender throughout the abdomen worse in the right lower quadrant suprapubic region with intermittent voluntary guarding, no rebound, no hepatosplenomegaly BACK:  The back appears normal and is non-tender to palpation, there is no CVA tenderness EXT: Normal ROM in all joints; non-tender to palpation; no edema; normal capillary refill; no cyanosis, no calf tenderness or swelling    SKIN: Normal color for age and race; warm; no rash NEURO: Moves all extremities equally PSYCH: The patient's mood and manner are appropriate. Grooming and  personal hygiene are appropriate.  MEDICAL DECISION MAKING: Patient here with abdominal pain. Labs ordered in triage showed a leukocytosis. Urine shows blood but she is currently on her menstrual cycle. She is not pregnant. Otherwise labs unremarkable. Concern for possible appendicitis, colitis, bowel obstruction. We'll obtain CT of her abdomen and pelvis for further evaluation. Will give IV fluids, pain and nausea medication.  ED PROGRESS: Patient's CT scan shows mild acute appendicitis without perforation or abscess. We'll give IV ceftriaxone and Flagyl. We will keep her NPO and continue IV fluids. We'll discuss with surgery on call.  1:15 AM Discussed patient's case with general surgeon, Dr. Reece Agar.  I have recommended admission and patient (and family if present) agree with this plan. Admitting physician will place admission orders.   I reviewed all nursing notes,  vitals, pertinent previous records, EKGs, lab and urine results, imaging (as available).     Marcela Alatorre, Delice Bison, DO 08/19/16 [phone]

## 2016-08-18 NOTE — ED Triage Notes (Signed)
Pt is c/o abd pain that started about 6 hrs ago  Pt states her abdomen is distended and it hurts all over  States the pain is radiating into her back  Pt states she feels like she needs to have a BM but nothing happens  Pt states when she tries she breaks out in a cold sweat  Pt has nausea without vomiting

## 2016-08-18 NOTE — ED Notes (Signed)
Pt c/o 6-8/10 abd pain that radiates to the back, nausea no vomiting, lethargy, and feeling the need to urinate onset 5 hours ago.

## 2016-08-19 ENCOUNTER — Observation Stay (HOSPITAL_COMMUNITY): Payer: 59 | Admitting: Certified Registered"

## 2016-08-19 ENCOUNTER — Encounter (HOSPITAL_COMMUNITY)
Admission: EM | Disposition: A | Discharge status: Home / Self Care | Payer: Self-pay | Source: Home / Self Care | Attending: Emergency Medicine

## 2016-08-19 ENCOUNTER — Encounter (HOSPITAL_COMMUNITY): Payer: Self-pay | Admitting: Certified Registered"

## 2016-08-19 ENCOUNTER — Inpatient Hospital Stay: Admit: 2016-08-19 | Payer: 59 | Admitting: Surgery

## 2016-08-19 DIAGNOSIS — K3580 Unspecified acute appendicitis: Secondary | ICD-10-CM | POA: Diagnosis present

## 2016-08-19 HISTORY — PX: LAPAROSCOPIC APPENDECTOMY: SHX408

## 2016-08-19 LAB — SURGICAL PCR SCREEN
MRSA, PCR: NEGATIVE
STAPHYLOCOCCUS AUREUS: NEGATIVE

## 2016-08-19 LAB — CBC
HEMATOCRIT: 38.4 % (ref 36.0–46.0)
Hemoglobin: 13.2 g/dL (ref 12.0–15.0)
MCH: 30 pg (ref 26.0–34.0)
MCHC: 34.4 g/dL (ref 30.0–36.0)
MCV: 87.3 fL (ref 78.0–100.0)
Platelets: 266 10*3/uL (ref 150–400)
RBC: 4.4 MIL/uL (ref 3.87–5.11)
RDW: 13 % (ref 11.5–15.5)
WBC: 12.9 10*3/uL — ABNORMAL HIGH (ref 4.0–10.5)

## 2016-08-19 LAB — BASIC METABOLIC PANEL
ANION GAP: 9 (ref 5–15)
BUN: 7 mg/dL (ref 6–20)
CALCIUM: 8.3 mg/dL — AB (ref 8.9–10.3)
CHLORIDE: 103 mmol/L (ref 101–111)
CO2: 26 mmol/L (ref 22–32)
CREATININE: 0.71 mg/dL (ref 0.44–1.00)
GFR calc Af Amer: >60 mL/min (ref >60)
GFR calc non Af Amer: >60 mL/min (ref >60)
GLUCOSE: 97 mg/dL (ref 65–99)
Potassium: 4 mmol/L (ref 3.5–5.1)
Sodium: 138 mmol/L (ref 135–145)

## 2016-08-19 LAB — HIV ANTIBODY (ROUTINE TESTING W REFLEX): HIV Screen 4th Generation wRfx: NONREACTIVE

## 2016-08-19 SURGERY — APPENDECTOMY, LAPAROSCOPIC
Anesthesia: General | Site: Abdomen

## 2016-08-19 MED ORDER — PROPOFOL 10 MG/ML IV BOLUS
INTRAVENOUS | Status: AC
  Filled 2016-08-19: qty 40

## 2016-08-19 MED ORDER — FENTANYL CITRATE (PF) 250 MCG/5ML IJ SOLN
INTRAMUSCULAR | Status: AC
  Filled 2016-08-19: qty 5

## 2016-08-19 MED ORDER — ENOXAPARIN SODIUM 40 MG/0.4ML ~~LOC~~ SOLN: 40.0000 mg | SUBCUTANEOUS | Status: DC

## 2016-08-19 MED ORDER — DEXTROSE 5 % IV SOLN
1.0000 g | Freq: Once | INTRAVENOUS | Status: AC
  Administered 2016-08-19: 1 g via INTRAVENOUS
  Filled 2016-08-19: qty 10

## 2016-08-19 MED ORDER — GABAPENTIN 300 MG PO CAPS: 300.0000 mg | ORAL_CAPSULE | Freq: Three times a day (TID) | ORAL | Status: DC | PRN

## 2016-08-19 MED ORDER — PROPOFOL 10 MG/ML IV BOLUS
INTRAVENOUS | Status: AC
  Filled 2016-08-19: qty 20

## 2016-08-19 MED ORDER — SODIUM CHLORIDE 0.9 % IV SOLN
INTRAVENOUS | Status: DC
  Administered 2016-08-19 (×2): via INTRAVENOUS

## 2016-08-19 MED ORDER — ALUM & MAG HYDROXIDE-SIMETH 200-200-20 MG/5ML PO SUSP
30.0000 mL | ORAL | Status: DC | PRN
  Administered 2016-08-19 – 2016-08-20 (×3): 30 mL via ORAL
  Filled 2016-08-19 (×3): qty 30

## 2016-08-19 MED ORDER — MORPHINE SULFATE (PF) 2 MG/ML IV SOLN: 1.0000 mg | INTRAVENOUS | Status: DC | PRN

## 2016-08-19 MED ORDER — ACETAMINOPHEN 10 MG/ML IV SOLN
INTRAVENOUS | Status: AC
  Filled 2016-08-19: qty 100

## 2016-08-19 MED ORDER — MIDAZOLAM HCL 2 MG/2ML IJ SOLN
INTRAMUSCULAR | Status: AC
  Filled 2016-08-19: qty 2

## 2016-08-19 MED ORDER — OXYCODONE HCL 5 MG PO TABS
5.0000 mg | ORAL_TABLET | ORAL | Status: DC | PRN
  Administered 2016-08-19 (×3): 5 mg via ORAL
  Administered 2016-08-20 (×2): 10 mg via ORAL
  Filled 2016-08-19: qty 2
  Filled 2016-08-19 (×3): qty 1
  Filled 2016-08-19: qty 2

## 2016-08-19 MED ORDER — HYDROMORPHONE HCL 1 MG/ML IJ SOLN
1.0000 mg | Freq: Once | INTRAMUSCULAR | Status: AC
  Administered 2016-08-19: 1 mg via INTRAVENOUS
  Filled 2016-08-19: qty 1

## 2016-08-19 MED ORDER — SUCCINYLCHOLINE CHLORIDE 200 MG/10ML IV SOSY
PREFILLED_SYRINGE | INTRAVENOUS | Status: DC | PRN
  Administered 2016-08-19: 110 mg via INTRAVENOUS

## 2016-08-19 MED ORDER — MIDAZOLAM HCL 2 MG/2ML IJ SOLN
INTRAMUSCULAR | Status: DC | PRN
Start: 2016-08-19 — End: 2016-08-19
  Administered 2016-08-19 (×2): 1 mg via INTRAVENOUS

## 2016-08-19 MED ORDER — LIDOCAINE 2% (20 MG/ML) 5 ML SYRINGE
INTRAMUSCULAR | Status: DC | PRN
  Administered 2016-08-19: 60 mg via INTRAVENOUS

## 2016-08-19 MED ORDER — FENTANYL CITRATE (PF) 100 MCG/2ML IJ SOLN
25.0000 ug | INTRAMUSCULAR | Status: DC | PRN
  Administered 2016-08-19 (×3): 50 ug via INTRAVENOUS

## 2016-08-19 MED ORDER — ROCURONIUM BROMIDE 10 MG/ML (PF) SYRINGE
PREFILLED_SYRINGE | INTRAVENOUS | Status: DC | PRN
  Administered 2016-08-19: 30 mg via INTRAVENOUS
  Administered 2016-08-19: 10 mg via INTRAVENOUS

## 2016-08-19 MED ORDER — DEXAMETHASONE SODIUM PHOSPHATE 10 MG/ML IJ SOLN
INTRAMUSCULAR | Status: DC | PRN
  Administered 2016-08-19: 10 mg via INTRAVENOUS

## 2016-08-19 MED ORDER — BUPIVACAINE-EPINEPHRINE 0.25% -1:200000 IJ SOLN
INTRAMUSCULAR | Status: DC | PRN
  Administered 2016-08-19: 30 mL

## 2016-08-19 MED ORDER — AMPHETAMINE-DEXTROAMPHETAMINE 10 MG PO TABS: 20.0000 mg | ORAL_TABLET | Freq: Every day | ORAL | Status: DC | PRN

## 2016-08-19 MED ORDER — MEPERIDINE HCL 50 MG/ML IJ SOLN: 6.2500 mg | INTRAMUSCULAR | Status: DC | PRN

## 2016-08-19 MED ORDER — PHENYLEPHRINE 40 MCG/ML (10ML) SYRINGE FOR IV PUSH (FOR BLOOD PRESSURE SUPPORT)
PREFILLED_SYRINGE | INTRAVENOUS | Status: DC | PRN
  Administered 2016-08-19 (×3): 120 ug via INTRAVENOUS

## 2016-08-19 MED ORDER — BUPIVACAINE-EPINEPHRINE (PF) 0.25% -1:200000 IJ SOLN
INTRAMUSCULAR | Status: AC
  Filled 2016-08-19: qty 30

## 2016-08-19 MED ORDER — ONDANSETRON 4 MG PO TBDP
4.0000 mg | ORAL_TABLET | Freq: Four times a day (QID) | ORAL | Status: DC | PRN
  Administered 2016-08-20: 4 mg via ORAL
  Filled 2016-08-19: qty 1

## 2016-08-19 MED ORDER — SUGAMMADEX SODIUM 200 MG/2ML IV SOLN
INTRAVENOUS | Status: DC | PRN
  Administered 2016-08-19: 150 mg via INTRAVENOUS

## 2016-08-19 MED ORDER — FENTANYL CITRATE (PF) 100 MCG/2ML IJ SOLN
INTRAMUSCULAR | Status: AC
  Filled 2016-08-19: qty 2

## 2016-08-19 MED ORDER — ENOXAPARIN SODIUM 40 MG/0.4ML ~~LOC~~ SOLN
40.0000 mg | SUBCUTANEOUS | Status: DC
  Administered 2016-08-20: 40 mg via SUBCUTANEOUS
  Filled 2016-08-19: qty 0.4

## 2016-08-19 MED ORDER — SODIUM CHLORIDE 0.9 % IV SOLN
INTRAVENOUS | Status: DC
  Administered 2016-08-19 (×2): via INTRAVENOUS

## 2016-08-19 MED ORDER — ONDANSETRON HCL 4 MG/2ML IJ SOLN
INTRAMUSCULAR | Status: DC | PRN
  Administered 2016-08-19: 4 mg via INTRAVENOUS

## 2016-08-19 MED ORDER — ONDANSETRON HCL 4 MG/2ML IJ SOLN: 4.0000 mg | Freq: Once | INTRAMUSCULAR | Status: DC | PRN

## 2016-08-19 MED ORDER — FENTANYL CITRATE (PF) 250 MCG/5ML IJ SOLN
INTRAMUSCULAR | Status: DC | PRN
  Administered 2016-08-19: 50 ug via INTRAVENOUS
  Administered 2016-08-19 (×2): 100 ug via INTRAVENOUS

## 2016-08-19 MED ORDER — PROPOFOL 10 MG/ML IV BOLUS
INTRAVENOUS | Status: DC | PRN
  Administered 2016-08-19: 120 mg via INTRAVENOUS

## 2016-08-19 MED ORDER — TRAMADOL HCL 50 MG PO TABS: 50.0000 mg | ORAL_TABLET | Freq: Four times a day (QID) | ORAL | Status: DC | PRN

## 2016-08-19 MED ORDER — ACETAMINOPHEN 500 MG PO TABS
1000.0000 mg | ORAL_TABLET | Freq: Four times a day (QID) | ORAL | Status: DC
  Administered 2016-08-19 – 2016-08-20 (×3): 1000 mg via ORAL
  Filled 2016-08-19 (×4): qty 2

## 2016-08-19 MED ORDER — ONDANSETRON HCL 4 MG/2ML IJ SOLN
4.0000 mg | Freq: Four times a day (QID) | INTRAMUSCULAR | Status: DC | PRN
  Administered 2016-08-19: 4 mg via INTRAVENOUS
  Filled 2016-08-19: qty 2

## 2016-08-19 MED ORDER — ACETAMINOPHEN 10 MG/ML IV SOLN
1000.0000 mg | Freq: Once | INTRAVENOUS | Status: AC
  Administered 2016-08-19: 1000 mg via INTRAVENOUS

## 2016-08-19 MED ORDER — LACTATED RINGERS IV SOLN
INTRAVENOUS | Status: DC | PRN
  Administered 2016-08-19: 10:00:00 via INTRAVENOUS

## 2016-08-19 MED ORDER — CLONAZEPAM 1 MG PO TBDP: 1.0000 mg | ORAL_TABLET | Freq: Three times a day (TID) | ORAL | Status: DC | PRN

## 2016-08-19 MED ORDER — LACTATED RINGERS IR SOLN
Status: DC | PRN
  Administered 2016-08-19: 1000 mL

## 2016-08-19 MED ORDER — METRONIDAZOLE IN NACL 5-0.79 MG/ML-% IV SOLN
500.0000 mg | Freq: Once | INTRAVENOUS | Status: AC
  Administered 2016-08-19: 500 mg via INTRAVENOUS
  Filled 2016-08-19: qty 100

## 2016-08-19 MED ORDER — LAMOTRIGINE 100 MG PO TABS
100.0000 mg | ORAL_TABLET | Freq: Every day | ORAL | Status: DC
  Administered 2016-08-20: 100 mg via ORAL
  Filled 2016-08-19: qty 1

## 2016-08-19 MED ORDER — 0.9 % SODIUM CHLORIDE (POUR BTL) OPTIME
TOPICAL | Status: DC | PRN
  Administered 2016-08-19: 1000 mL

## 2016-08-19 MED ORDER — PIPERACILLIN-TAZOBACTAM 3.375 G IVPB
3.3750 g | Freq: Three times a day (TID) | INTRAVENOUS | Status: DC
  Administered 2016-08-19 (×2): 3.375 g via INTRAVENOUS
  Filled 2016-08-19 (×2): qty 50

## 2016-08-19 MED ORDER — METHOCARBAMOL 1000 MG/10ML IJ SOLN
500.0000 mg | Freq: Once | INTRAMUSCULAR | Status: AC
  Administered 2016-08-19: 500 mg via INTRAVENOUS
  Filled 2016-08-19: qty 5

## 2016-08-19 MED ORDER — MORPHINE SULFATE (PF) 2 MG/ML IV SOLN
2.0000 mg | INTRAVENOUS | Status: DC | PRN
  Administered 2016-08-19 (×2): 2 mg via INTRAVENOUS
  Filled 2016-08-19 (×2): qty 1

## 2016-08-19 SURGICAL SUPPLY — 39 items
ADH SKN CLS APL DERMABOND .7 (GAUZE/BANDAGES/DRESSINGS) ×1
APL SKNCLS STERI-STRIP NONHPOA (GAUZE/BANDAGES/DRESSINGS)
APPLIER CLIP ROT 10 11.4 M/L (STAPLE)
APR CLP MED LRG 11.4X10 (STAPLE)
BAG SPEC RTRVL LRG 6X4 10 (ENDOMECHANICALS) ×1
BENZOIN TINCTURE PRP APPL 2/3 (GAUZE/BANDAGES/DRESSINGS) IMPLANT
CABLE HIGH FREQUENCY MONO STRZ (ELECTRODE) ×2 IMPLANT
CHLORAPREP W/TINT 26ML (MISCELLANEOUS) ×2 IMPLANT
CLIP APPLIE ROT 10 11.4 M/L (STAPLE) IMPLANT
COVER SURGICAL LIGHT HANDLE (MISCELLANEOUS) ×2 IMPLANT
CUTTER FLEX LINEAR 45M (STAPLE) ×1 IMPLANT
DECANTER SPIKE VIAL GLASS SM (MISCELLANEOUS) ×2 IMPLANT
DERMABOND ADVANCED (GAUZE/BANDAGES/DRESSINGS) ×1
DERMABOND ADVANCED .7 DNX12 (GAUZE/BANDAGES/DRESSINGS) ×1 IMPLANT
DRAPE LAPAROSCOPIC ABDOMINAL (DRAPES) ×2 IMPLANT
ELECT REM PT RETURN 15FT ADLT (MISCELLANEOUS) ×2 IMPLANT
ENDOLOOP SUT PDS II  0 18 (SUTURE)
ENDOLOOP SUT PDS II 0 18 (SUTURE) IMPLANT
GLOVE SURG SIGNA 7.5 PF LTX (GLOVE) ×2 IMPLANT
GOWN STRL REUS W/TWL XL LVL3 (GOWN DISPOSABLE) ×4 IMPLANT
IRRIG SUCT STRYKERFLOW 2 WTIP (MISCELLANEOUS) ×2
IRRIGATION SUCT STRKRFLW 2 WTP (MISCELLANEOUS) ×1 IMPLANT
KIT BASIN OR (CUSTOM PROCEDURE TRAY) ×2 IMPLANT
POUCH SPECIMEN RETRIEVAL 10MM (ENDOMECHANICALS) ×2 IMPLANT
RELOAD 45 VASCULAR/THIN (ENDOMECHANICALS) IMPLANT
RELOAD STAPLE 45 2.5 WHT GRN (ENDOMECHANICALS) IMPLANT
RELOAD STAPLE 45 3.5 BLU ETS (ENDOMECHANICALS) IMPLANT
RELOAD STAPLE TA45 3.5 REG BLU (ENDOMECHANICALS) ×2 IMPLANT
SCISSORS LAP 5X35 DISP (ENDOMECHANICALS) ×2 IMPLANT
SHEARS HARMONIC ACE PLUS 36CM (ENDOMECHANICALS) ×2 IMPLANT
SLEEVE XCEL OPT CAN 5 100 (ENDOMECHANICALS) ×2 IMPLANT
STRIP CLOSURE SKIN 1/2X4 (GAUZE/BANDAGES/DRESSINGS) IMPLANT
SUT MNCRL AB 4-0 PS2 18 (SUTURE) ×2 IMPLANT
SUT VIC AB 2-0 SH 18 (SUTURE) IMPLANT
TOWEL OR 17X26 10 PK STRL BLUE (TOWEL DISPOSABLE) ×2 IMPLANT
TOWEL OR NON WOVEN STRL DISP B (DISPOSABLE) ×2 IMPLANT
TRAY LAPAROSCOPIC (CUSTOM PROCEDURE TRAY) ×2 IMPLANT
TROCAR BLADELESS OPT 5 100 (ENDOMECHANICALS) ×2 IMPLANT
TROCAR XCEL BLUNT TIP 100MML (ENDOMECHANICALS) ×2 IMPLANT

## 2016-08-19 NOTE — Progress Notes (Signed)
Extremely anxious and restless, gripping sides of bed with hands and breathing rapidly at times, awaitim orders from anesthesiologist

## 2016-08-19 NOTE — Anesthesia Procedure Notes (Signed)
Date/Time: 08/19/2016 9:34 AM Performed by: Cynda Familia Oxygen Delivery Method: Simple face mask Placement Confirmation: positive ETCO2 and breath sounds checked- equal and bilateral Comments: Extubated good AW --- simple face mask-- to RR O2 intact

## 2016-08-19 NOTE — Op Note (Signed)
Re:   Christine Duncan DOB:   10-20-1980 MRN:   [id number]                   FACILITY:  [facility]  DATE OF PROCEDURE: 08/19/2016                              OPERATIVE REPORT  PREOPERATIVE DIAGNOSIS:  Appendicitis  POSTOPERATIVE DIAGNOSIS:  Acute appendicitis, perforated.  PROCEDURE:  Laparoscopic appendectomy.  SURGEON:  Fenton Malling. Lucia Gaskins, MD  ASSISTANT:  No first assistant.  ANESTHESIA:  General endotracheal.  Anesthesiologist: Janeece Riggers, MD CRNA: Cynda Familia, CRNA  ASA:  2E  ESTIMATED BLOOD LOSS:  Minimal.  DRAINS: none   SPECIMEN:   Appendix  COUNTS CORRECT:  YES  INDICATIONS FOR PROCEDURE: Christine Duncan is a 36 y.o. (DOB: [birth year]) white female whose primary care doctor is Plotnikov, Evie Lacks, MD and comes to the OR for an appendectomy.   I discussed with the patient, the indications and potential complications of appendiceal surgery.  The potential complications include, but are not limited to, bleeding, open surgery, bowel resection, and the possibility of another diagnosis.  OPERATIVE NOTE:  The patient underwent a general endotracheal anesthetic as supervised by Anesthesiologist: Janeece Riggers, MD CRNA: Cynda Familia, CRNA, General, in room #4 at the [place].  The patient was given Rocephin/Flagyl prior to the beginning of the procedure and the abdomen was prepped with ChloraPrep.   A time-out was held and surgical checklist run.  An infraumbilical incision was made with sharp dissection carried down to the abdominal cavity.  An 12 mm Hasson trocar was inserted through the infraumbilical incision and into the peritoneal cavity.  A 0 degree 10 mm laparoscope was inserted through a 12 mm Hasson trocar and the Hasson trocar secured with a 0 Vicryl suture.  I placed a 5 mm trocar in the right upper quadrant and 5 mm torcar in left lower quadrant and did abdominal exploration.    The right and left lobes of liver unremarkable.  Stomach was unremarkable.  The  pelvic organs were unremarkable.  I saw no other intra-abdominal abnormality.  The patient had appendicitis with the appendix located right pelvic brim.  The tip of the appendix had perforated and was walled off by the small bowel.  She had a small 1.0 cm abscess at the tip of the appendix/small bowel interface.  The mesentery of the appendix was divided with a Harmonic scalpel.  I got to the base of the appendix.  I then used a blue load 45 mm Ethicon Endo-GIA stapler and fired this across the base of the appendix.  I placed the appendix in EndoCatch bag and delivered the bag through the umbilical incision.  I irrigated the abdomen with 1,000 cc of saline.  She had almost no contamination from the abscess.  After irrigating the abdomen, I then removed the trocars, in turn.  The umbilical port fascia was closed with 0 Vicryl suture.   I closed the skin each site with a 4-0 Monocryl suture and painted the wounds with DermaBond.  I then injected a total of 30 mL of 0.25% Marcaine at the incisions.  Sponge and needle count were correct at the end of the case.  The patient was transferred to the recovery room in good condition.  The patient tolerated the procedure well and it depends on the patient's post op clinical course as  to when the patient could be discharged.   Alphonsa Overall, MD, [facility] Surgery Pager: [phone] Office phone:  [phone]

## 2016-08-19 NOTE — Anesthesia Postprocedure Evaluation (Signed)
Anesthesia Post Note  Patient: Christine Duncan  Procedure(s) Performed: Procedure(s) (LRB): APPENDECTOMY LAPAROSCOPIC (N/A)     Patient location during evaluation: PACU Anesthesia Type: General Level of consciousness: awake and alert Pain management: pain level controlled Vital Signs Assessment: post-procedure vital signs reviewed and stable Respiratory status: spontaneous breathing, nonlabored ventilation, respiratory function stable and patient connected to nasal cannula oxygen Cardiovascular status: blood pressure returned to baseline and stable Postop Assessment: no signs of nausea or vomiting Anesthetic complications: no    Last Vitals:  Vitals:   08/19/16 1011 08/19/16 1015  BP:  109/66  Pulse: 63 73  Resp: 15 15  Temp:      Last Pain:  Vitals:   08/19/16 1011  TempSrc:   PainSc: 6                  Markies Mowatt

## 2016-08-19 NOTE — Anesthesia Procedure Notes (Signed)
Procedure Name: Intubation Date/Time: 08/19/2016 8:40 AM Performed by: Cynda Familia Pre-anesthesia Checklist: Patient identified, Emergency Drugs available, Suction available and Patient being monitored Patient Re-evaluated:Patient Re-evaluated prior to induction Oxygen Delivery Method: Circle System Utilized Preoxygenation: Pre-oxygenation with 100% oxygen Induction Type: IV induction, Rapid sequence and Cricoid Pressure applied Laryngoscope Size: Miller and 2 Grade View: Grade I Tube type: Oral Tube size: 7.0 mm Number of attempts: 1 Airway Equipment and Method: Stylet Placement Confirmation: ETT inserted through vocal cords under direct vision,  positive ETCO2 and breath sounds checked- equal and bilateral Secured at: 21 cm Tube secured with: Tape Dental Injury: Teeth and Oropharynx as per pre-operative assessment  Comments: Smooth IV induction Oddono--- intubation AM CRNA atraumatic--- teeth and mouth as preop-- bilat BS Oddono

## 2016-08-19 NOTE — H&P (Signed)
Re:   Christine Duncan DOB:   Sep 09, 1980 MRN:   [id number]   [facility] Admission  Chief Complaint Abdominal pain  ASSESEMENT AND PLAN: 1.  Appendicitis  I discussed with the patient the indications and risks of appendiceal surgery.  The primary risks of appendiceal surgery include, but are not limited to, bleeding, infection, bowel surgery, and open surgery.  There is also the risk that the patient may have continued symptoms after surgery.  We discussed the typical post-operative recovery course. I tried to answer the patient's questions.  2.  Anxiety/depression 3.  Fibromyalgia 4.  Smokes 5.  IBS 6,  Bipolar disease  Chief Complaint  Patient presents with  . Abdominal Pain   PHYSICIAN REQUESTING CONSULTATION:  Kristen Ward, DO  HISTORY OF PRESENT ILLNESS: Christine Duncan is a 36 y.o. (DOB: September 03, 1980)  white female whose primary care physician is Plotnikov, Evie Lacks, MD and came to the [facility] with abdominal pain. Her husband, Christine Duncan, and mother, Christine Duncan, are at the bedside.  The patient has a history of irritable bowel syndrome. She has seen a gastroenterologist at [facility], but cannot remember the name of the physician she saw (she also said that she was not going back to him). She was doing well until yesterday at work. She developed what she thought was gas pain. She took some probiotics without improvement in her symptoms. She called her primary care physician's office who referred her to the emergency room. She had diaphoresis and nausea, but no vomiting. She has had 2 prior C-sections as her only prior abdominal surgery.  CT scan of the abdomen - 08/19/2016 - Mild acute appendicitis, with distention of the appendix to 1.0 cm near its distal tip, surrounding soft tissue inflammation and mild wall thickening. No evidence of perforation or abscess formation at this time. WBC - 12,900 - 08/19/2016   Past Medical History:  Diagnosis Date  . Acid reflux   . Allergy   . Anemia     . Anxiety   . B12 deficiency   . Bipolar 1 disorder ([place])   . Blood type, Rh negative   . Cervical dysplasia   . Depression   . Essential tremor   . Fibromyalgia   . Hives   . IBS (irritable bowel syndrome)   . Neuromuscular disorder ([place])    fibromylagia  . Spastic colon       Past Surgical History:  Procedure Laterality Date  . CERVICAL BIOPSY  W/ LOOP ELECTRODE EXCISION    . CESAREAN SECTION  2006  . CESAREAN SECTION  2011  . COLPOSCOPY    . DILATION AND CURETTAGE OF UTERUS  2005  . INTRAUTERINE DEVICE INSERTION  05/09/2016  . LEEP  07/2010   CIN II, Margins free      Current Facility-Administered Medications  Medication Dose Route Frequency Provider Last Rate Last Dose  . 0.9 %  sodium chloride infusion   Intravenous Continuous Ward, Delice Bison, DO 125 mL/hr at 08/19/16 0117    . acetaminophen (TYLENOL) tablet 1,000 mg  1,000 mg Oral Q6H Kinsinger, Arta Bruce, MD      . enoxaparin (LOVENOX) injection 40 mg  40 mg Subcutaneous Q24H Kinsinger, Arta Bruce, MD      . iopamidol (ISOVUE-300) 61 % injection           . morphine 2 MG/ML injection 2 mg  2 mg Intravenous Q1H PRN Kinsinger, Arta Bruce, MD   2 mg at 08/19/16 0631  .  ondansetron (ZOFRAN-ODT) disintegrating tablet 4 mg  4 mg Oral Q6H PRN Kinsinger, Arta Bruce, MD       Or  . ondansetron [facility]) injection 4 mg  4 mg Intravenous Q6H PRN Kinsinger, Arta Bruce, MD   4 mg at 08/19/16 0254  . oxyCODONE (Oxy IR/ROXICODONE) immediate release tablet 5-10 mg  5-10 mg Oral Q4H PRN Kinsinger, Arta Bruce, MD      . sodium chloride 0.9 % injection           . traMADol (ULTRAM) tablet 50 mg  50 mg Oral Q6H PRN Kinsinger, Arta Bruce, MD          Allergies  Allergen Reactions  . Carbidopa-Levodopa Nausea And Vomiting  . Sertraline Hcl Other (See Comments)    Reaction:  Manic episode    REVIEW OF SYSTEMS: Skin:  No history of rash.  No history of abnormal moles. Infection:  No history of hepatitis or HIV.  No history of  MRSA. Neurologic:  No history of stroke.  No history of seizure.  No history of headaches. Cardiac:  No history of hypertension. No history of heart disease.    No history of seeing a cardiologist. Pulmonary:  Smokes.  She knows that it is bad for her health.  Endocrine:  No diabetes. No thyroid disease. Gastrointestinal:  See HPI Urologic:  No history of kidney stones.  No history of bladder infections. Musculoskeletal:  No history of joint or back disease. Hematologic:  No bleeding disorder.  No history of anemia.  Not anticoagulated. Psycho-social:  Anxiety/depression.  Sees Dr. Chucky May.  SOCIAL and FAMILY HISTORY: Her husband, Christine Duncan, and mother, Christine Duncan (11/24/1959), are at the bedside.  I did her mother's gastric bypass in 2015. 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  Father had appendicitis.  PHYSICAL EXAM: BP (!) 84/55 (BP Location: Right Arm)   Pulse 75   Temp 98.4 F (36.9 C) (Oral)   Resp 16   Ht 5' 8.5" (1.74 m)   Wt 69 kg (152 lb 1.9 oz)   SpO2 100%   BMI 22.79 kg/m   General: WN WF who is alert and generally healthy appearing.  Skin:  Inspection and palpation - no mass or rash. Eyes:  Conjunctiva and lids unremarkable.            Pupils are equal Ears, Nose, Mouth, and Throat:  Ears and nose unremarkable            Lips and teeth are unremarable. Neck: Supple. No mass, trachea midline.  No thyroid mass. Lymph Nodes:  No supraclavicular, cervical, or inguinal nodes. Lungs: Normal respiratory effort.  Clear to auscultation and symmetric breath sounds. Heart:  Palpation of the heart is normal.            Auscultation: RRR. No murmur or rub.  Abdomen: Soft. No mass.  No hernia.   Tender and guarding in the right abdomen.  Rare BS.          Rectal: Not done. Musculoskeletal:  Digits and nails are unremarkable.            Good muscle strength and ROM  in upper and lower extremities. Neurologic:  Grossly intact to motor and sensory function. Psychiatric:  Normal judgement and insight. Behavior is normal.            Oriented to time, person, place.   DATA REVIEWED, COUNSELING AND COORDINATION OF CARE: Epic notes reviewed. Counseling and coordination of care exceeded more than  50% of the time spent with patient. Total time spent with patient and charting: 45 minutes  Alphonsa Overall, MD,  [facility] Surgery, [address]  [place], [place]    [place] Phone:  [phone] FAX:  [phone]

## 2016-08-19 NOTE — Anesthesia Preprocedure Evaluation (Addendum)
Anesthesia Evaluation  Patient identified by MRN, date of birth, ID band Patient awake    Reviewed: Allergy & Precautions, NPO status , Patient's Chart, lab work & pertinent test results  Airway Mallampati: II  TM Distance: >3 FB Neck ROM: Full    Dental no notable dental hx. (+) Dental Advisory Given   Pulmonary neg pulmonary ROS, Current Smoker,    Pulmonary exam normal breath sounds clear to auscultation       Cardiovascular negative cardio ROS Normal cardiovascular exam Rhythm:Regular Rate:Normal     Neuro/Psych Anxiety Depression Bipolar Disorder  Neuromuscular disease negative neurological ROS  negative psych ROS   GI/Hepatic negative GI ROS, Neg liver ROS, GERD  Medicated,  Endo/Other  negative endocrine ROS  Renal/GU negative Renal ROS  negative genitourinary   Musculoskeletal negative musculoskeletal ROS (+) Fibromyalgia -  Abdominal   Peds negative pediatric ROS (+)  Hematology negative hematology ROS (+) anemia ,   Anesthesia Other Findings     Reproductive/Obstetrics negative OB ROS                            Anesthesia Physical Anesthesia Plan  ASA: II  Anesthesia Plan: General   Post-op Pain Management:    Induction: Intravenous  PONV Risk Score and Plan: 2 and Ondansetron, Dexamethasone, Treatment may vary due to age or medical condition and Midazolam  Airway Management Planned: Oral ETT  Additional Equipment:   Intra-op Plan:   Post-operative Plan: Extubation in OR  Informed Consent: I have reviewed the patients History and Physical, chart, labs and discussed the procedure including the risks, benefits and alternatives for the proposed anesthesia with the patient or authorized representative who has indicated his/her understanding and acceptance.   Dental advisory given  Plan Discussed with:   Anesthesia Plan Comments: (  )        Anesthesia  Quick Evaluation

## 2016-08-19 NOTE — Progress Notes (Signed)
Pt has returned to room 1527 after surgery. Alert and oriented. Will begin Clear Diet. Family at bedside. Pt has urinated in [facility].

## 2016-08-19 NOTE — Transfer of Care (Signed)
Immediate Anesthesia Transfer of Care Note  Patient: Christine Duncan  Procedure(s) Performed: Procedure(s): APPENDECTOMY LAPAROSCOPIC (N/A)  Patient Location: PACU  Anesthesia Type:General  Level of Consciousness: awake and alert   Airway & Oxygen Therapy: Patient Spontanous Breathing and Patient connected to face mask oxygen  Post-op Assessment: Report given to RN and Post -op Vital signs reviewed and stable  Post vital signs: Reviewed and stable  Last Vitals:  Vitals:   08/19/16 0513 08/19/16 0808  BP: (!) 84/55 104/66  Pulse: 75 80  Resp: 16 18  Temp: 36.9 C 36.8 C    Last Pain:  Vitals:   08/19/16 0808  TempSrc: Oral  PainSc: 6       Patients Stated Pain Goal: 2 (67/01/10 0349)  Complications: No apparent anesthesia complications

## 2016-08-20 ENCOUNTER — Encounter (HOSPITAL_COMMUNITY): Payer: Self-pay | Admitting: Surgery

## 2016-08-20 MED ORDER — FLUCONAZOLE 100 MG PO TABS: 150.0000 mg | ORAL_TABLET | Freq: Once | ORAL | 0 refills | Status: AC

## 2016-08-20 MED ORDER — AMOXICILLIN-POT CLAVULANATE 875-125 MG PO TABS: 1.0000 | ORAL_TABLET | Freq: Two times a day (BID) | ORAL | 3 refills | Status: DC

## 2016-08-20 MED ORDER — HYDROCODONE-ACETAMINOPHEN 5-325 MG PO TABS: 1.0000 | ORAL_TABLET | Freq: Four times a day (QID) | ORAL | 0 refills | Status: DC | PRN

## 2016-08-20 NOTE — Progress Notes (Signed)
Assessment unchanged. Pt verbalized understanding of dc instructions through teach back regarding meds to resume, follow up care and when to call the doctor. IV removed. Scripts X 3 as provided by MD. Discharged via wc to front entrance accompanied by family and NT.

## 2016-08-20 NOTE — Discharge Summary (Signed)
Physician Discharge Summary  Patient ID:  Christine Duncan  MRN: [id number]  DOB/AGE: [birth year] 36 y.o.  Admit date: 08/18/2016 Discharge date: 08/20/2016  Discharge Diagnoses:  1. Acute appendicitis, focally perforated  2.  Anxiety/depression 3.  Fibromyalgia 4.  Smokes 5.  IBS 6,  Bipolar disease   Active Problems:   Acute appendicitis   Operation: Procedure(s): APPENDECTOMY LAPAROSCOPIC on 08/19/2016 - D. Lucia Gaskins  Discharged Condition: good  Hospital Course: Christine Duncan is an 36 y.o. female whose primary care physician is Plotnikov, Evie Lacks, MD and who was admitted 08/18/2016 with a chief complaint of  Chief Complaint  Patient presents with  . Abdominal Pain  .   She was brought to the operating room on 08/18/2016 - 08/19/2016 and underwent lap appendectomy.   She is doing well, taking regular diet, and ready to go home. Her appendix was focally perforated.  So I will give her 5 days of Augmentin.  She says that she gets a vaginal yeast infection with antibiotics, so I gave her Diflucan.  Her husband and mother are in the room.  The discharge instructions were reviewed with the patient.  Consults: None  Significant Diagnostic Studies: Results for orders placed or performed during the hospital encounter of 08/18/16  Surgical pcr screen  Result Value Ref Range   MRSA, PCR NEGATIVE NEGATIVE   Staphylococcus aureus NEGATIVE NEGATIVE  Lipase, blood  Result Value Ref Range   Lipase 13 11 - 51 U/L  Comprehensive metabolic panel  Result Value Ref Range   Sodium 136 135 - 145 mmol/L   Potassium 3.8 3.5 - 5.1 mmol/L   Chloride 101 101 - 111 mmol/L   CO2 25 22 - 32 mmol/L   Glucose, Bld 112 (H) 65 - 99 mg/dL   BUN 10 6 - 20 mg/dL   Creatinine, Ser 0.79 0.44 - 1.00 mg/dL   Calcium 9.1 8.9 - 10.3 mg/dL   Total Protein 7.6 6.5 - 8.1 g/dL   Albumin 4.2 3.5 - 5.0 g/dL   AST 16 15 - 41 U/L   ALT 15 14 - 54 U/L   Alkaline Phosphatase 61 38 - 126 U/L   Total Bilirubin 0.7  0.3 - 1.2 mg/dL   GFR calc non Af Amer >60 >60 mL/min   GFR calc Af Amer >60 >60 mL/min   Anion gap 10 5 - 15  CBC  Result Value Ref Range   WBC 13.8 (H) 4.0 - 10.5 K/uL   RBC 4.88 3.87 - 5.11 MIL/uL   Hemoglobin 15.0 12.0 - 15.0 g/dL   HCT 42.3 36.0 - 46.0 %   MCV 86.7 78.0 - 100.0 fL   MCH 30.7 26.0 - 34.0 pg   MCHC 35.5 30.0 - 36.0 g/dL   RDW 13.0 11.5 - 15.5 %   Platelets 308 150 - 400 K/uL  Urinalysis, Routine w reflex microscopic  Result Value Ref Range   Color, Urine YELLOW YELLOW   APPearance CLEAR CLEAR   Specific Gravity, Urine 1.026 1.005 - 1.030   pH 5.0 5.0 - 8.0   Glucose, UA NEGATIVE NEGATIVE mg/dL   Hgb urine dipstick SMALL (A) NEGATIVE   Bilirubin Urine NEGATIVE NEGATIVE   Ketones, ur 20 (A) NEGATIVE mg/dL   Protein, ur NEGATIVE NEGATIVE mg/dL   Nitrite NEGATIVE NEGATIVE   Leukocytes, UA NEGATIVE NEGATIVE   RBC / HPF 0-5 0 - 5 RBC/hpf   WBC, UA 0-5 0 - 5 WBC/hpf   Bacteria, UA NONE SEEN NONE  SEEN   Squamous Epithelial / LPF 0-5 (A) NONE SEEN   Mucous PRESENT   HIV antibody (Routine Testing)  Result Value Ref Range   HIV Screen 4th Generation wRfx Non Reactive Non Reactive  CBC  Result Value Ref Range   WBC 12.9 (H) 4.0 - 10.5 K/uL   RBC 4.40 3.87 - 5.11 MIL/uL   Hemoglobin 13.2 12.0 - 15.0 g/dL   HCT 38.4 36.0 - 46.0 %   MCV 87.3 78.0 - 100.0 fL   MCH 30.0 26.0 - 34.0 pg   MCHC 34.4 30.0 - 36.0 g/dL   RDW 13.0 11.5 - 15.5 %   Platelets 266 150 - 400 K/uL  Basic metabolic panel  Result Value Ref Range   Sodium 138 135 - 145 mmol/L   Potassium 4.0 3.5 - 5.1 mmol/L   Chloride 103 101 - 111 mmol/L   CO2 26 22 - 32 mmol/L   Glucose, Bld 97 65 - 99 mg/dL   BUN 7 6 - 20 mg/dL   Creatinine, Ser 0.71 0.44 - 1.00 mg/dL   Calcium 8.3 (L) 8.9 - 10.3 mg/dL   GFR calc non Af Amer >60 >60 mL/min   GFR calc Af Amer >60 >60 mL/min   Anion gap 9 5 - 15  I-Stat beta hCG blood, ED  Result Value Ref Range   I-stat hCG, quantitative <5.0 <5 mIU/mL   Comment 3             Ct Abdomen Pelvis W Contrast  Result Date: 08/19/2016 CLINICAL DATA:  Acute onset of generalized abdominal pain, radiating to the back. Abdominal distention. Nausea. Diaphoresis. Initial encounter. EXAM: CT ABDOMEN AND PELVIS WITH CONTRAST TECHNIQUE: Multidetector CT imaging of the abdomen and pelvis was performed using the standard protocol following bolus administration of intravenous contrast. CONTRAST:  116mL ISOVUE-300 IOPAMIDOL (ISOVUE-300) INJECTION 61% COMPARISON:  None. FINDINGS: Lower chest: Minimal right basilar atelectasis is noted. The visualized portions of the mediastinum are unremarkable. Hepatobiliary: The liver is unremarkable in appearance. The gallbladder is unremarkable in appearance. The common bile duct remains normal in caliber. Pancreas: The pancreas is within normal limits. Spleen: The spleen is unremarkable in appearance. Adrenals/Urinary Tract: The adrenal glands are unremarkable in appearance. The kidneys are within normal limits. There is no evidence of hydronephrosis. No renal or ureteral stones are identified. No perinephric stranding is seen. Stomach/Bowel: The appendix is distended to 1.0 cm near its distal tip, with mild soft tissue inflammation and mild wall thickening, concerning for mild acute appendicitis. There is no evidence of perforation or abscess formation at this time. The colon is grossly unremarkable in appearance. The small bowel is grossly unremarkable. The stomach is within normal limits. Vascular/Lymphatic: Minimal calcification is noted at the aortic bifurcation. The abdominal aorta is otherwise unremarkable. The inferior vena cava is grossly unremarkable. No retroperitoneal lymphadenopathy is seen. No pelvic sidewall lymphadenopathy is identified. Reproductive: The bladder is mildly distended and grossly unremarkable. The uterus is unremarkable in appearance. An intrauterine device is noted in expected position at the fundus of the uterus. Other:  No additional soft tissue abnormalities are seen. Musculoskeletal: No acute osseous abnormalities are identified. The visualized musculature is unremarkable in appearance. IMPRESSION: Mild acute appendicitis, with distention of the appendix to 1.0 cm near its distal tip, surrounding soft tissue inflammation and mild wall thickening. No evidence of perforation or abscess formation at this time. These results were called by telephone at the time of interpretation on 08/19/2016 at 12:55 am to  Dr. Pryor Curia, who verbally acknowledged these results. Electronically Signed   By: Garald Balding M.D.   On: 08/19/2016 00:56    Discharge Exam:  Vitals:   08/20/16 0223 08/20/16 0545  BP: 105/68 120/76  Pulse: (!) 53 (!) 53  Resp: 18 18  Temp: 98.7 F (37.1 C) (!) 97.5 F (36.4 C)    General: tan WF who is alert. Lungs: Clear to auscultation and symmetric breath sounds. Heart:  RRR. No murmur or rub. Abdomen: Soft. No mass. No hernia. Normal bowel sounds.   Incisions look good.   Discharge Medications:   Allergies as of 08/20/2016      Reactions   Carbidopa-levodopa Nausea And Vomiting   Sertraline Hcl Other (See Comments)   Reaction:  Manic episode      Medication List    TAKE these medications   amoxicillin-clavulanate 875-125 MG tablet Commonly known as:  AUGMENTIN Take 1 tablet by mouth 2 (two) times daily.   amphetamine-dextroamphetamine 20 MG tablet Commonly known as:  ADDERALL Take 20 mg by mouth daily as needed (to focus).   amphetamine-dextroamphetamine 20 MG 24 hr capsule Commonly known as:  ADDERALL XR Take 20 mg by mouth daily.   cetirizine 10 MG tablet Commonly known as:  ZYRTEC Take 10 mg by mouth daily.   cimetidine 400 MG tablet Commonly known as:  TAGAMET Take 1 tablet (400 mg total) by mouth at bedtime.   clonazePAM 0.5 MG disintegrating tablet Commonly known as:  KLONOPIN Take 1 mg by mouth 3 (three) times daily as needed (for anxiety).   cyclobenzaprine 5  MG tablet Commonly known as:  FLEXERIL Take 1 tablet (5 mg total) by mouth 3 (three) times daily as needed for muscle spasms.   fluconazole 100 MG tablet Commonly known as:  DIFLUCAN Take 1.5 tablets (150 mg total) by mouth once.   gabapentin 300 MG capsule Commonly known as:  NEURONTIN Take 300 mg by mouth 3 (three) times daily as needed (for pain).   HYDROcodone-acetaminophen 5-325 MG tablet Commonly known as:  NORCO/VICODIN Take 1-2 tablets by mouth every 6 (six) hours as needed for moderate pain.   lamoTRIgine 100 MG tablet Commonly known as:  LAMICTAL Take 100 mg by mouth daily.   levonorgestrel 20 MCG/24HR IUD Commonly known as:  MIRENA 1 each by Intrauterine route once.   omeprazole 20 MG capsule Commonly known as:  PRILOSEC Take 2 capsules by mouth daily. Please disregard last RX sent over signed by Dr Linda Hedges What changed:  how much to take  how to take this  when to take this  additional instructions   rosuvastatin 10 MG tablet Commonly known as:  CRESTOR Take 1 tablet (10 mg total) by mouth daily.       Disposition: 01-Home or Self Care  Discharge Instructions    Diet - low sodium heart healthy    Complete by:  As directed    Increase activity slowly    Complete by:  As directed       Return to work on:  08/28/2016  Activity:  Driving - May drive in 2 or 3 days, if doing well   Lifting - No lifting more than 15 pounds for 1 week, then no limit  Wound Care:   May shower starting tomorrow  Diet:  As tolerated  Follow up appointment:  Call Dr. Pollie Friar office [facility]) at [phone] for an appointment in 2 to 3 weeks  Medications and dosages:  Resume your  home medications.  You have a prescription for:  Vicodin, Augmentin, Diflucan  Signed: Alphonsa Overall, M.D., [facility] Office:  [phone]  08/20/2016, 10:06 AM

## 2016-08-20 NOTE — Discharge Instructions (Signed)
[facility] - DISCHARGE INSTRUCTIONS TO PATIENT  Return to work on:  08/28/2016  Activity:  Driving - May drive in 2 or 3 days, if doing well   Lifting - No lifting more than 15 pounds for 1 week, then no limit  Wound Care:   May shower starting tomorrow  Diet:  As tolerated  Follow up appointment:  Call Dr. Pollie Friar office [facility]) at [phone] for an appointment in 2 to 3 weeks  Medications and dosages:  Resume your home medications.  You have a prescription for:  Vicodin, Augmentin, Diflucan  Call Dr. Lucia Gaskins or his office  [phone]) if you have:  Temperature greater than 100.4,  Persistent nausea and vomiting,  Severe uncontrolled pain,  Redness, tenderness, or signs of infection (pain, swelling, redness, odor or green/yellow discharge around the site),  Difficulty breathing, headache or visual disturbances,  Any other questions or concerns you may have after discharge.  In an emergency, call 911 or go to an Emergency Department at a nearby hospital.

## 2016-08-22 ENCOUNTER — Encounter (HOSPITAL_COMMUNITY): Payer: Self-pay | Admitting: Surgery

## 2016-08-24 ENCOUNTER — Ambulatory Visit (INDEPENDENT_AMBULATORY_CARE_PROVIDER_SITE_OTHER): Payer: 59 | Admitting: Internal Medicine

## 2016-08-24 ENCOUNTER — Telehealth: Payer: Self-pay | Admitting: Internal Medicine

## 2016-08-24 ENCOUNTER — Encounter: Payer: Self-pay | Admitting: Internal Medicine

## 2016-08-24 VITALS — BP 128/82 | HR 88 | Ht 68.5 in | Wt 158.0 lb

## 2016-08-24 DIAGNOSIS — R221 Localized swelling, mass and lump, neck: Secondary | ICD-10-CM | POA: Diagnosis not present

## 2016-08-24 DIAGNOSIS — F17200 Nicotine dependence, unspecified, uncomplicated: Secondary | ICD-10-CM | POA: Diagnosis not present

## 2016-08-24 DIAGNOSIS — B370 Candidal stomatitis: Secondary | ICD-10-CM | POA: Diagnosis not present

## 2016-08-24 DIAGNOSIS — K3580 Unspecified acute appendicitis: Secondary | ICD-10-CM

## 2016-08-24 MED ORDER — NYSTATIN 100000 UNIT/ML MT SUSP: 500000.0000 [IU] | Freq: Four times a day (QID) | OROMUCOSAL | 0 refills | Status: AC

## 2016-08-24 MED ORDER — METHYLPREDNISOLONE ACETATE 80 MG/ML IJ SUSP
80.0000 mg | Freq: Once | INTRAMUSCULAR | Status: AC
  Administered 2016-08-24: 80 mg via INTRAMUSCULAR

## 2016-08-24 NOTE — Patient Instructions (Signed)
You had the steroid shot today  Please take all new medication as prescribed - the Nystatin for possible thrush  Please call for you EGD and colonoscopy as you mentioned, as soon as you are able  Please continue all other medications as before, and refills have been done if requested.  Please have the pharmacy call with any other refills you may need.  Please keep your appointments with your specialists as you may have planned

## 2016-08-24 NOTE — Telephone Encounter (Signed)
[name] - Client [facility] Patient Name: Christine Duncan DOB: Mar 02, 1980 Initial Comment caller states she had an emergency appendectomy on Saturday, She is having trouble swallowing and her throat feels swollen. Nurse Assessment Nurse: Martyn Ehrich, RN, [place] Date/Time (Eastern Time): 08/24/2016 3:22:50 PM Confirm and document reason for call. If symptomatic, describe symptoms. ---PT had appendectomy on Sat. Throat started swelling and trouble swallowing since surgery. She thought it was related to intubation and she thought it would get better with time - but this is 7 d later so it should not be bothering her and they told her to follow up with PCP. No fever. Does the patient have any new or worsening symptoms? ---Yes Will a triage be completed? ---Yes Related visit to physician within the last 2 weeks? ---Yes Does the PT have any chronic conditions? (i.e. diabetes, asthma, etc.) ---No Is the patient pregnant or possibly pregnant? (Ask all females between the ages of 67-55) ---No Is this a behavioral health or substance abuse call? ---No Guidelines Guideline Title Affirmed Question Affirmed Notes Leg Swelling and Edema [1] MODERATE leg swelling (e.g., swelling extends up to knees) AND [2] new onset or worsening Swallowing Difficulty [1] Swallowing difficulty AND [2] cause unknown (Exception: difficulty swallowing is a chronic symptom) Final Disposition User See Physician within 24 Hours [place], RN, [place] Comments called office backline and they were able to schedule appt today with Dr. Cathlean Cower. Referrals REFERRED TO PCP OFFICE Disagree/Comply: Comply Call Id: [phone]

## 2016-08-24 NOTE — Progress Notes (Signed)
Subjective:    Patient ID: Christine Duncan, female    DOB: 12/29/1980, 36 y.o.   MRN: [id number]  HPI  Here after recent hospn 8/3-8/5 with acute appendicitis with local perforation, s/p appendectomy 8/3, tx with IV antibiotic and improved, discharged home with po augmentin, to which she has some mild dysphagia due to size of pills.  Denies other duysphagia, and Denies worsening reflux, abd pain, dysphagia, n/v, bowel change or blood.  Needs note for out of work this wk only.  Denies worsening depressive symptoms, suicidal ideation, or panic.  Pt denies fever, wt loss, night sweats, loss of appetite, or other constitutional symptoms Past Medical History:  Diagnosis Date  . Acid reflux   . Allergy   . Anemia   . Anxiety   . B12 deficiency   . Bipolar 1 disorder ([place])   . Blood type, Rh negative   . Cervical dysplasia   . Depression   . Essential tremor   . Fibromyalgia   . Hives   . IBS (irritable bowel syndrome)   . Neuromuscular disorder ([place])    fibromylagia  . Spastic colon    Past Surgical History:  Procedure Laterality Date  . CERVICAL BIOPSY  W/ LOOP ELECTRODE EXCISION    . CESAREAN SECTION  2006  . CESAREAN SECTION  2011  . COLPOSCOPY    . DILATION AND CURETTAGE OF UTERUS  2005  . INTRAUTERINE DEVICE INSERTION  05/09/2016  . LAPAROSCOPIC APPENDECTOMY N/A 08/19/2016   Procedure: APPENDECTOMY LAPAROSCOPIC;  Surgeon: Alphonsa Overall, MD;  Location: [facility];  Service: General;  Laterality: N/A;  . LEEP  07/2010   CIN II, Margins free    reports that she has been smoking Cigarettes.  She has been smoking about 1.00 pack per day. She has never used smokeless tobacco. She reports that she drinks alcohol. She reports that she does not use drugs. family history includes Alcohol abuse in her maternal grandfather; Bipolar disorder in her maternal grandfather and paternal grandmother; Breast cancer (age of onset: 37) in her paternal grandmother; Cancer in her paternal grandmother; Cancer  (age of onset: 41) in her father; Colon cancer in her paternal grandmother; Depression in her maternal grandmother and mother; Diabetes in her paternal grandmother; Drug abuse in her maternal grandfather; Esophageal cancer in her father; Hyperlipidemia in her paternal grandmother; Seizures in her maternal grandmother; Stroke in her paternal grandmother; Thyroid disease in her maternal grandmother and mother. Allergies  Allergen Reactions  . Carbidopa-Levodopa Nausea And Vomiting  . Sertraline Hcl Other (See Comments)    Reaction:  Manic episode   Current Outpatient Prescriptions on File Prior to Visit  Medication Sig Dispense Refill  . amoxicillin-clavulanate (AUGMENTIN) 875-125 MG tablet Take 1 tablet by mouth 2 (two) times daily. 10 tablet 3  . amphetamine-dextroamphetamine (ADDERALL XR) 20 MG 24 hr capsule Take 20 mg by mouth daily.    Marland Kitchen amphetamine-dextroamphetamine (ADDERALL) 20 MG tablet Take 20 mg by mouth daily as needed (to focus).   0  . cetirizine (ZYRTEC) 10 MG tablet Take 10 mg by mouth daily.    . cimetidine (TAGAMET) 400 MG tablet Take 1 tablet (400 mg total) by mouth at bedtime. 30 tablet 11  . clonazePAM (KLONOPIN) 0.5 MG disintegrating tablet Take 1 mg by mouth 3 (three) times daily as needed (for anxiety).    . cyclobenzaprine (FLEXERIL) 5 MG tablet Take 1 tablet (5 mg total) by mouth 3 (three) times daily as needed for muscle spasms. [place]  tablet 2  . gabapentin (NEURONTIN) 300 MG capsule Take 300 mg by mouth 3 (three) times daily as needed (for pain).   0  . HYDROcodone-acetaminophen (NORCO/VICODIN) 5-325 MG tablet Take 1-2 tablets by mouth every 6 (six) hours as needed for moderate pain. 20 tablet 0  . lamoTRIgine (LAMICTAL) 100 MG tablet Take 100 mg by mouth daily.    Marland Kitchen levonorgestrel (MIRENA) 20 MCG/24HR IUD 1 each by Intrauterine route once.    Marland Kitchen omeprazole (PRILOSEC) 20 MG capsule Take 2 capsules by mouth daily. Please disregard last RX sent over signed by Dr Linda Hedges  (Patient taking differently: Take 40 mg by mouth daily. ) 180 capsule 3  . rosuvastatin (CRESTOR) 10 MG tablet Take 1 tablet (10 mg total) by mouth daily. 30 tablet 11   No current facility-administered medications on file prior to visit.    Review of Systems  Constitutional: Negative for other unusual diaphoresis or sweats HENT: Negative for ear discharge or swelling Eyes: Negative for other worsening visual disturbances Respiratory: Negative for stridor or other swelling  Gastrointestinal: Negative for worsening distension or other blood Genitourinary: Negative for retention or other urinary change Musculoskeletal: Negative for other MSK pain or swelling Skin: Negative for color change or other new lesions Neurological: Negative for worsening tremors and other numbness  Psychiatric/Behavioral: Negative for worsening agitation or other fatigue All other system neg per pt    Objective:   Physical Exam BP 128/82   Pulse 88   Ht 5' 8.5" (1.74 m)   Wt 158 lb (71.7 kg)   SpO2 99%   BMI 23.67 kg/m  VS noted, not ill appearing Constitutional: Pt appears in NAD HENT: Head: NCAT.  Right Ear: External ear normal.  Left Ear: External ear normal.  Eyes: . Pupils are equal, round, and reactive to light. Conjunctivae and EOM are normal Nose: without d/c or deformity Tongue with mild white rash Neck: Neck supple. Gross normal ROM Cardiovascular: Normal rate and regular rhythm.   Pulmonary/Chest: Effort normal and breath sounds without rales or wheezing.  Abd:  Soft, NT, ND, + BS, no organomegaly - benign exam, no flank tender Neurological: Pt is alert. At baseline orientation, motor grossly intact Skin: Skin is warm. No rashes, other new lesions, no LE edema Psychiatric: Pt behavior is normal without agitation  No other exam findings Lab Results  Component Value Date   WBC 12.9 (H) 08/19/2016   HGB 13.2 08/19/2016   HCT 38.4 08/19/2016   PLT 266 08/19/2016   GLUCOSE 97 08/19/2016     CHOL 206 (H) 08/01/2016   TRIG 53.0 08/01/2016   HDL 45.80 08/01/2016   LDLCALC 150 (H) 08/01/2016   ALT 15 08/18/2016   AST 16 08/18/2016   NA 138 08/19/2016   K 4.0 08/19/2016   CL 103 08/19/2016   CREATININE 0.71 08/19/2016   BUN 7 08/19/2016   CO2 26 08/19/2016   TSH 0.52 08/01/2016       Assessment & Plan:

## 2016-08-26 NOTE — Assessment & Plan Note (Signed)
C/o some persistent post anesthesia throat swelling, exam with mild swelling at best, has some difficulty with augmentin pill size but o/w no dysphagia to solids or liquids, for depomedrol IM 80, ok to follow but to consider egd and colonoscopy as has already been recommended but annot afford at this time

## 2016-08-26 NOTE — Assessment & Plan Note (Signed)
Mild, for nystatin soln asd, . to f/u any worsening symptoms or concerns

## 2016-08-26 NOTE — Assessment & Plan Note (Signed)
Clinically resolved and stable, no further tx needed

## 2016-08-26 NOTE — Assessment & Plan Note (Addendum)
Urged to quit, has tried chantix, decline tx at this time

## 2016-09-08 ENCOUNTER — Encounter: Payer: Self-pay | Admitting: Obstetrics & Gynecology

## 2016-09-08 ENCOUNTER — Ambulatory Visit (INDEPENDENT_AMBULATORY_CARE_PROVIDER_SITE_OTHER): Payer: 59 | Admitting: Obstetrics & Gynecology

## 2016-09-08 VITALS — BP 114/70

## 2016-09-08 DIAGNOSIS — N898 Other specified noninflammatory disorders of vagina: Secondary | ICD-10-CM | POA: Diagnosis not present

## 2016-09-08 DIAGNOSIS — Z975 Presence of (intrauterine) contraceptive device: Secondary | ICD-10-CM

## 2016-09-08 DIAGNOSIS — N921 Excessive and frequent menstruation with irregular cycle: Secondary | ICD-10-CM

## 2016-09-08 DIAGNOSIS — N9089 Other specified noninflammatory disorders of vulva and perineum: Secondary | ICD-10-CM | POA: Diagnosis not present

## 2016-09-08 LAB — WET PREP FOR TRICH, YEAST, CLUE
Clue Cells Wet Prep HPF POC: NONE SEEN
Trich, Wet Prep: NONE SEEN
WBC WET PREP: NONE SEEN
YEAST WET PREP: NONE SEEN

## 2016-09-08 MED ORDER — CLOBETASOL PROPIONATE 0.05 % EX OINT: 1.0000 "application " | TOPICAL_OINTMENT | Freq: Two times a day (BID) | CUTANEOUS | 0 refills | Status: AC

## 2016-09-08 NOTE — Progress Notes (Signed)
    Christine Duncan [birth year] [id number]        36 y.o.  [id number] Married  RP:  Vaginal/vulvar itching/irritation and black dots in vulvar area  HPI:  Vaginal and Rt vulvar itching and irritation x a few days.  No pelvic pain except for cramps with improved but still frequent BTB on Mirena IUD x 05/09/2016.  No vaginal odor.  Noticed a couple small black dots on Rt vulva.  No UTI Sx.  No fever.  Past medical history,surgical history, problem list, medications, allergies, family history and social history were all reviewed and documented in the EPIC chart.  Directed ROS with pertinent positives and negatives documented in the history of present illness/assessment and plan.  Exam:  Vitals:   09/08/16 1028  BP: 114/70   General appearance:  Normal  Gyn exam:  Vulva:  Right vulvar erythema, small sebaceous gland cysts x 2 and small hemangiomas x3.                     Speculum:  Cervix normal, strings seen.  Vagina normal.  Mild dark red/brown blood.  Minimal d/c, wet prep done.     Wet prep neg  Assessment/Plan:  36 y.o. [id number]   1. Vaginal irritation Wet prep negative.  Reassured. - WET PREP FOR [place], YEAST, CLUE  2. Vulvar lesion Inflammation of Rt vulva, acute vulvitis.  2 small sebaceous gland cysts and pinpoint hemangiomas x 3.  Recommend warm soaking for sebaceous gland cysts.  No lesion c/w Herpes. Clobetasol 0.05% ointment prescribed to apply twice a day x 1 week on Rt vulva.  3. Breakthrough bleeding with IUD Declines Progestin-pill add back.  Will observe ad 6 months post insertion and if BTB persists, will schedule with Dr Wayne Both to discuss/remove.  Counseling on above issues >50% x 15 minutes.  Princess Bruins MD, 10:34 AM 09/08/2016

## 2016-09-08 NOTE — Patient Instructions (Signed)
1. Vaginal irritation Wet prep negative.  Reassured. - WET PREP FOR [name], YEAST, CLUE  2. Vulvar lesion Inflammation of Rt vulva, acute vulvitis.  2 small sebaceous gland cysts and pinpoint hemangiomas x 3.  Recommend warm soaking for sebaceous gland cysts.  No lesion c/w Herpes. Clobetasol 0.05% ointment prescribed to apply twice a day x 1 week on Rt vulva.  3. Breakthrough bleeding with IUD Declines Progestin-pill add back.  Will observe ad 6 months post insertion and if BTB persists, will schedule with Dr Wayne Both to discuss/remove.  Christine Duncan, it was a pleasure to meet you today!

## 2016-09-15 ENCOUNTER — Ambulatory Visit (INDEPENDENT_AMBULATORY_CARE_PROVIDER_SITE_OTHER): Payer: 59 | Admitting: Family Medicine

## 2016-09-15 ENCOUNTER — Encounter: Payer: Self-pay | Admitting: Family Medicine

## 2016-09-15 VITALS — BP 110/80 | HR 87 | Temp 98.1°F | Ht 68.5 in | Wt 150.0 lb

## 2016-09-15 DIAGNOSIS — L509 Urticaria, unspecified: Secondary | ICD-10-CM | POA: Diagnosis not present

## 2016-09-15 DIAGNOSIS — R21 Rash and other nonspecific skin eruption: Secondary | ICD-10-CM

## 2016-09-15 DIAGNOSIS — D229 Melanocytic nevi, unspecified: Secondary | ICD-10-CM | POA: Insufficient documentation

## 2016-09-15 MED ORDER — METHYLPREDNISOLONE ACETATE 40 MG/ML IJ SUSP
40.0000 mg | Freq: Once | INTRAMUSCULAR | Status: AC
Start: 2016-09-15 — End: 2016-09-15
  Administered 2016-09-15: 40 mg via INTRAMUSCULAR

## 2016-09-15 NOTE — Patient Instructions (Addendum)
Thank you for coming in,   Please try to follow up with your regular doctor in the future to discuss the need for a referral to an allergist or a rheumatologist.    Please feel free to call with any questions or concerns at any time, at [phone]. --Dr. Raeford Razor

## 2016-09-15 NOTE — Assessment & Plan Note (Signed)
It is possible that her symptoms are associated with hives. Exam was not explicitly revealing today. She does report that a steroid injection as the only resolution of her symptoms. - IM depo 40 mg - If this reoccurs then would advise her to be referred to rheumatology or allergist and she has not been seen in 20 some years. - Counseled today on the effects of receiving steroid injections on a regular basis.

## 2016-09-15 NOTE — Progress Notes (Signed)
Christine Duncan - 36 y.o. female MRN [id number]  Date of birth: 07-30-1980  SUBJECTIVE:  Including CC & ROS.  Chief Complaint  Patient presents with  . Urticaria    patient states she has chronic hives due to stress or friction and states the only thing that helps is a steroid injection    Christine Duncan is a 36 year old female that is presenting with urticaria. She reports she has chronic urticaria and this is been ongoing for 20 years. She reports having extensive workup by allergist and rheumatologist that found no treatment that worked for her. She receives steroid injections once or twice a year and have her see [facility] symptoms. She reports her triggers are stress and friction. She has itching in the back of her head as well as her for head that started earlier this week. Oral medications do not seem to help. Her symptoms seem to be refractory to prednisone. She has not been seen by a rheumatologist or an allergist and 20 some years. She has not been worked up any further during this time.   Received a steroid injection on 08/24/16 for throat swelling. Review for blood work from 08/19/16 shows normal kidney function and electrolytes  Review of Systems  Constitutional: Negative for fever.  HENT: Negative for facial swelling.   Respiratory: Negative for apnea and shortness of breath.   Musculoskeletal: Negative for joint swelling.  Skin: Positive for rash.  Hematological: Negative for adenopathy.   otherwise negative  HISTORY: Past Medical, Surgical, Social, and Family History Reviewed & Updated per EMR.   Pertinent Historical Findings include:  Past Medical History:  Diagnosis Date  . Acid reflux   . Allergy   . Anemia   . Anxiety   . B12 deficiency   . Bipolar 1 disorder ([place])   . Blood type, Rh negative   . Cervical dysplasia   . Depression   . Essential tremor   . Fibromyalgia   . Hives   . IBS (irritable bowel syndrome)   . Neuromuscular disorder ([place])    fibromylagia  .  Spastic colon     Past Surgical History:  Procedure Laterality Date  . CERVICAL BIOPSY  W/ LOOP ELECTRODE EXCISION    . CESAREAN SECTION  2006  . CESAREAN SECTION  2011  . COLPOSCOPY    . DILATION AND CURETTAGE OF UTERUS  2005  . INTRAUTERINE DEVICE INSERTION  05/09/2016  . LAPAROSCOPIC APPENDECTOMY N/A 08/19/2016   Procedure: APPENDECTOMY LAPAROSCOPIC;  Surgeon: Alphonsa Overall, MD;  Location: [facility];  Service: General;  Laterality: N/A;  . LEEP  07/2010   CIN II, Margins free    Allergies  Allergen Reactions  . Carbidopa-Levodopa Nausea And Vomiting  . Sertraline Hcl Other (See Comments)    Reaction:  Manic episode    Family History  Problem Relation Age of Onset  . Thyroid disease Mother   . Depression Mother   . Cancer Father 70       esoph ca  . Esophageal cancer Father   . Hyperlipidemia Paternal Grandmother   . Diabetes Paternal Grandmother   . Breast cancer Paternal Grandmother 18  . Cancer Paternal Grandmother        Bladder and colon cancer  . Colon cancer Paternal Grandmother   . Bipolar disorder Paternal Grandmother   . Stroke Paternal Grandmother   . Seizures Maternal Grandmother   . Thyroid disease Maternal Grandmother   . Depression Maternal Grandmother   . Bipolar disorder Maternal Grandfather   .  Alcohol abuse Maternal Grandfather   . Drug abuse Maternal Grandfather   . Rectal cancer Neg Hx   . Stomach cancer Neg Hx      Social History   Social History  . Marital status: Married    Spouse name: N/A  . Number of children: N/A  . Years of education: N/A   Occupational History  . WORKS [employer]    [employer]   Social History Main Topics  . Smoking status: Current Every Day Smoker    Packs/day: 1.00    Types: Cigarettes  . Smokeless tobacco: Never Used  . Alcohol use 0.0 oz/week     Comment: Rare  . Drug use: No  . Sexual activity: Yes    Birth control/ protection: Condom     Comment: 1st intercourse 36 yo-Fewer than 5 partners  Mirena 05/09/2016    Other Topics Concern  . Not on file   Social History Narrative   Regular exercise- yes     PHYSICAL EXAM:  VS: BP 110/80 (BP Location: Left Arm, Patient Position: Sitting, Cuff Size: Normal)   Pulse 87   Temp 98.1 F (36.7 C) (Oral)   Ht 5' 8.5" (1.74 m)   Wt 150 lb (68 kg)   LMP 09/04/2016 Comment: MIRENA  SpO2 100%   BMI 22.48 kg/m  Physical Exam Gen: NAD, alert, cooperative with exam, well-appearing ENT: normal lips, normal nasal mucosa,  Eye: normal EOM, normal conjunctiva and lids CV:  no edema, +2 pedal pulses   Resp: no accessory muscle use, non-labored,  Skin: No explicit wheals or raised areas noticeable. Some redness on her for head. Neuro: normal tone, normal sensation to touch Psych:  normal insight, alert and oriented MSK: Normal gait, normal strength      ASSESSMENT & PLAN:   Rash It is possible that her symptoms are associated with hives. Exam was not explicitly revealing today. She does report that a steroid injection as the only resolution of her symptoms. - IM depo 40 mg - If this reoccurs then would advise her to be referred to rheumatology or allergist and she has not been seen in 20 some years. - Counseled today on the effects of receiving steroid injections on a regular basis.

## 2016-09-27 ENCOUNTER — Ambulatory Visit (INDEPENDENT_AMBULATORY_CARE_PROVIDER_SITE_OTHER): Payer: 59 | Admitting: Gynecology

## 2016-09-27 ENCOUNTER — Encounter: Payer: Self-pay | Admitting: Gynecology

## 2016-09-27 VITALS — BP 124/80 | Ht 68.0 in | Wt 150.0 lb

## 2016-09-27 DIAGNOSIS — Z30432 Encounter for removal of intrauterine contraceptive device: Secondary | ICD-10-CM

## 2016-09-27 DIAGNOSIS — N926 Irregular menstruation, unspecified: Secondary | ICD-10-CM

## 2016-09-27 NOTE — Progress Notes (Signed)
    Christine Duncan [birth year] [id number]        36 y.o.  [id number] presents wanting to have her IUD removed. Since placement in April 2018 she's had irregular bleeding where she will bleed up to 2 weeks. Was treated with Megace without benefit. Recently had appendicitis with appendectomy. CT scan demonstrated IUD within the fundus of the uterus. Also noting more recent onset of cystic acne on her face and back that she's a tripping to the IUD.  Past medical history,surgical history, problem list, medications, allergies, family history and social history were all reviewed and documented in the EPIC chart.  Directed ROS with pertinent positives and negatives documented in the history of present illness/assessment and plan.  Exam: [name] assistant Vitals:   09/27/16 0840  BP: 124/80  Weight: 150 lb (68 kg)  Height: 5\' 8"  (1.727 m)   General appearance:  Normal Abdomen soft nontender without masses guarding rebound Pelvic external BUS vagina normal. Cervix normal IUD string visualized. Uterus grossly normal midline mobile nontender. Adnexa without masses or tenderness.  Procedure: The cervix was visualized and the IUD string was grasped with the [facility] forcep, removed, shown to the patient and discarded. Patient tolerated well.  Assessment/Plan:  36 y.o. [id number] with continued irregular bleeding with Mirena IUD. Also was cystic acne that she is attributing to the IUD. She wanted IUD removed regardless. Options for birth control were reviewed. She does smoke at age 66. We reviewed the risk of thrombosis particularly with estrogen products such as BCPs, ring and patch. Possible increased risk also with Depo-Provera. Alternatives to include Nexplanon, barrier methods, sterilization, trial of nonhormonal IUD such as ParaGard or waiting several cycles and replacing the Mirena to see how she does was discussed. Patient is going to use barrier methods now and follow up with her ultimate birth control  decision.   Anastasio Auerbach MD, 9:00 AM 09/27/2016

## 2016-09-27 NOTE — Patient Instructions (Signed)
Follow up with your birth control decision

## 2016-10-17 ENCOUNTER — Encounter: Payer: Self-pay | Admitting: Internal Medicine

## 2016-11-02 ENCOUNTER — Ambulatory Visit: Payer: Self-pay | Admitting: Family Medicine

## 2016-11-07 ENCOUNTER — Encounter: Payer: Self-pay | Admitting: Internal Medicine

## 2016-11-07 ENCOUNTER — Ambulatory Visit (INDEPENDENT_AMBULATORY_CARE_PROVIDER_SITE_OTHER): Payer: 59 | Admitting: Internal Medicine

## 2016-11-07 DIAGNOSIS — L509 Urticaria, unspecified: Secondary | ICD-10-CM

## 2016-11-07 DIAGNOSIS — E538 Deficiency of other specified B group vitamins: Secondary | ICD-10-CM | POA: Diagnosis not present

## 2016-11-07 DIAGNOSIS — B079 Viral wart, unspecified: Secondary | ICD-10-CM

## 2016-11-07 DIAGNOSIS — L210 Seborrhea capitis: Secondary | ICD-10-CM | POA: Diagnosis not present

## 2016-11-07 MED ORDER — PODOFILOX 0.5 % EX GEL: Freq: Two times a day (BID) | CUTANEOUS | 1 refills | Status: DC

## 2016-11-07 MED ORDER — CLOBETASOL PROPIONATE 0.05 % EX SOLN: 1.0000 "application " | Freq: Every day | CUTANEOUS | 3 refills | Status: DC | PRN

## 2016-11-07 MED ORDER — KETOCONAZOLE 2 % EX SHAM: 1.0000 "application " | MEDICATED_SHAMPOO | CUTANEOUS | 3 refills | Status: DC

## 2016-11-07 MED ORDER — METHYLPREDNISOLONE ACETATE 80 MG/ML IJ SUSP: 80.0000 mg | INTRAMUSCULAR | Status: DC

## 2016-11-07 MED ORDER — METHYLPREDNISOLONE ACETATE 80 MG/ML IJ SUSP
80.0000 mg | Freq: Once | INTRAMUSCULAR | Status: AC
  Administered 2016-11-07: 80 mg via INTRAMUSCULAR

## 2016-11-07 NOTE — Assessment & Plan Note (Signed)
Shampoo Rx

## 2016-11-07 NOTE — Patient Instructions (Signed)
Gluten free trial for 3 months. OK to use gluten-free bread and gluten-free pasta.    Gluten-Free Diet for Celiac Disease, Adult The gluten-free diet includes all foods that do not contain gluten. Gluten is a protein that is found in wheat, rye, barley, and some other grains. Following the gluten-free diet is the only treatment for people with celiac disease. It helps to prevent damage to the intestines and improves or eliminates the symptoms of celiac disease. Following the gluten-free diet requires some planning. It can be challenging at first, but it gets easier with time and practice. There are more gluten-free options available today than ever before. If you need help finding gluten-free foods or if you have questions, talk with your diet and nutrition specialist (registered dietitian) or your health care provider. What do I need to know about a gluten-free diet?  All fruits, vegetables, and meats are safe to eat and do not contain gluten.  When grocery shopping, start by shopping in the produce, meat, and dairy sections. These sections are more likely to contain gluten-free foods. Then move to the aisles that contain packaged foods if you need to.  Read all food labels. Gluten is often added to foods. Always check the ingredient list and look for warnings, such as "may contain gluten."  Talk with your dietitian or health care provider before taking a gluten-free multivitamin or mineral supplement.  Be aware of gluten-free foods having contact with foods that contain gluten (cross-contamination). This can happen at home and with any processed foods. ? Talk with your health care provider or dietitian about how to reduce the risk of cross-contamination in your home. ? If you have questions about how a food is processed, ask the manufacturer. What key words help to identify gluten? Foods that list any of these key words on the label usually contain gluten:  Wheat, flour, enriched  flour, bromated flour, white flour, durum flour, graham flour, phosphated flour, self-rising flour, semolina, farina, barley (malt), rye, and oats.  Starch, dextrin, modified food starch, or cereal.  Thickening, fillers, or emulsifiers.  Malt flavoring, malt extract, or malt syrup.  Hydrolyzed vegetable protein.  In the [place] packaged foods that are gluten-free are required to be labeled "GF." These foods should be easy to identify and are safe to eat. In the [place] food companies are also required to list common food allergens, including wheat, on their labels. Recommended foods Grains  Amaranth, bean flours, 100% buckwheat flour, corn, millet, nut flours or nut meals, GF oats, quinoa, rice, sorghum, teff, rice wafers, pure cornmeal tortillas, popcorn, and hot cereals made from cornmeal. Hominy, rice, wild rice. Some Asian rice noodles or bean noodles. Arrowroot starch, corn bran, corn flour, corn germ, cornmeal, corn starch, potato flour, potato starch flour, and rice bran. Plain, brown, and sweet rice flours. Rice polish, soy flour, and tapioca starch. Vegetables  All plain fresh, frozen, and canned vegetables. Fruits  All plain fresh, frozen, canned, and dried fruits, and 100% fruit juices. Meats and other protein foods  All fresh beef, pork, poultry, fish, seafood, and eggs. Fish canned in water, oil, brine, or vegetable broth. Plain nuts and seeds, peanut butter. Some lunch meat and some frankfurters. Dried beans, dried peas, and lentils. Dairy  Fresh plain, dry, evaporated, or condensed milk. Cream, butter, sour cream, whipping cream, and most yogurts. Unprocessed cheese, most processed cheeses, some cottage cheese, some cream cheeses. Beverages  Coffee, tea, most herbal teas. Carbonated beverages and some root beers.  Wine, sake, and distilled spirits, such as gin, vodka, and whiskey. Most hard ciders. Fats and oils  Butter, margarine, vegetable oil, hydrogenated butter, olive  oil, shortening, lard, cream, and some mayonnaise. Some commercial salad dressings. Olives. Sweets and desserts  Sugar, honey, some syrups, molasses, jelly, and jam. Plain hard candy, marshmallows, and gumdrops. Pure cocoa powder. Plain chocolate. Custard and some pudding mixes. Gelatin desserts, sorbets, frozen ice pops, and sherbet. Cake, cookies, and other desserts prepared with allowed flours. Some commercial ice creams. Cornstarch, tapioca, and rice puddings. Seasoning and other foods  Some canned or frozen soups. Monosodium glutamate (MSG). Cider, rice, and wine vinegar. Baking soda and baking powder. Cream of tartar. Baking and nutritional yeast. Certain soy sauces made without wheat (ask your dietitian about specific brands that are allowed). Nuts, coconut, and chocolate. Salt, pepper, herbs, spices, flavoring extracts, imitation or artificial flavorings, natural flavorings, and food colorings. Some medicines and supplements. Some lip glosses and other cosmetics. Rice syrups. The items listed may not be a complete list. Talk with your dietitian about what dietary choices are best for you. Foods to avoid Grains  Barley, bran, bulgur, couscous, cracked wheat, , farro, graham, malt, matzo, semolina, wheat germ, and all wheat and rye cereals including spelt and kamut. Cereals containing malt as a flavoring, such as rice cereal. Noodles, spaghetti, macaroni, most packaged rice mixes, and all mixes containing wheat, rye, barley, or triticale. Vegetables  Most creamed vegetables and most vegetables canned in sauces. Some commercially prepared vegetables and salads. Fruits  Thickened or prepared fruits and some pie fillings. Some fruit snacks and fruit roll-ups. Meats and other protein foods  Any meat or meat alternative containing wheat, rye, barley, or gluten stabilizers. These are often marinated or packaged meats and lunch meats. Bread-containing products, such as Swiss steak,  croquettes, meatballs, and meatloaf. Most tuna canned in vegetable broth and turkey with hydrolyzed vegetable protein (HVP) injected as part of the basting. Seitan. Imitation fish. Eggs in sauces made from ingredients to avoid. Dairy  Commercial chocolate milk drinks and malted milk. Some non-dairy creamers. Any cheese product containing ingredients to avoid. Beverages  Certain cereal beverages. Beer, ale, malted milk, and some root beers. Some hard ciders. Some instant flavored coffees. Some herbal teas made with barley or with barley malt added. Fats and oils  Some commercial salad dressings. Sour cream containing modified food starch. Sweets and desserts  Some toffees. Chocolate-coated nuts (may be rolled in wheat flour) and some commercial candies and candy bars. Most cakes, cookies, donuts, pastries, and other baked goods. Some commercial ice cream. Ice cream cones. Commercially prepared mixes for cakes, cookies, and other desserts. Bread pudding and other puddings thickened with flour. Products containing brown rice syrup made with barley malt enzyme. Desserts and sweets made with malt flavoring. Seasoning and other foods  Some curry powders, some dry seasoning mixes, some gravy extracts, some meat sauces, some ketchups, some prepared mustards, and horseradish. Certain soy sauces. Malt vinegar. Bouillon and bouillon cubes that contain HVP. Some chip dips, and some chewing gum. Yeast extract. Brewer's yeast. Caramel color. Some medicines and supplements. Some lip glosses and other cosmetics. The items listed may not be a complete list. Talk with your dietitian about what dietary choices are best for you. Summary  Gluten is a protein that is found in wheat, rye, barley, and some other grains. The gluten-free diet includes all foods that do not contain gluten.  If you need help finding gluten-free foods or if   you have questions, talk with your diet and nutrition specialist (registered  dietitian) or your health care provider.  Read all food labels. Gluten is often added to foods. Always check the ingredient list and look for warnings, such as "may contain gluten." This information is not intended to replace advice given to you by your health care provider. Make sure you discuss any questions you have with your health care provider. Document Released: 01/02/2005 Document Revised: 10/18/2015 Document Reviewed: 10/18/2015 Elsevier Interactive Patient Education  2018 [employer]   

## 2016-11-07 NOTE — Assessment & Plan Note (Signed)
Depo-medrol prn standing order

## 2016-11-07 NOTE — Assessment & Plan Note (Addendum)
See Cryo Condylox for small warts (non-genital warts)

## 2016-11-07 NOTE — Assessment & Plan Note (Signed)
On B12 Labs 

## 2016-11-07 NOTE — Progress Notes (Signed)
Subjective:  Patient ID: Christine Duncan, female    DOB: 07-24-1980  Age: 36 y.o. MRN: [id number]  CC: No chief complaint on file.   HPI Christine Duncan presents for urticaria f/u; dyslipidemia C/o warts C/o scalp itching, dandruff   Outpatient Medications Prior to Visit  Medication Sig Dispense Refill  . amphetamine-dextroamphetamine (ADDERALL) 20 MG tablet Take 20 mg by mouth daily as needed (to focus).   0  . cetirizine (ZYRTEC) 10 MG tablet Take 10 mg by mouth daily.    . cimetidine (TAGAMET) 400 MG tablet Take 1 tablet (400 mg total) by mouth at bedtime. 30 tablet 11  . clonazePAM (KLONOPIN) 0.5 MG disintegrating tablet Take 1 mg by mouth 3 (three) times daily as needed (for anxiety).    . cyclobenzaprine (FLEXERIL) 5 MG tablet Take 1 tablet (5 mg total) by mouth 3 (three) times daily as needed for muscle spasms. 270 tablet 2  . gabapentin (NEURONTIN) 300 MG capsule Take 300 mg by mouth 3 (three) times daily as needed (for pain).   0  . lamoTRIgine (LAMICTAL) 100 MG tablet Take 100 mg by mouth daily.    Marland Kitchen omeprazole (PRILOSEC) 20 MG capsule Take 2 capsules by mouth daily. Please disregard last RX sent over signed by Dr Linda Hedges (Patient taking differently: Take 40 mg by mouth daily. ) 180 capsule 3  . levonorgestrel (MIRENA) 20 MCG/24HR IUD 1 each by Intrauterine route once.     No facility-administered medications prior to visit.     ROS Review of Systems  Constitutional: Negative for activity change, appetite change, chills, fatigue and unexpected weight change.  HENT: Negative for congestion, mouth sores and sinus pressure.   Eyes: Negative for visual disturbance.  Respiratory: Negative for cough and chest tightness.   Gastrointestinal: Negative for abdominal pain and nausea.  Genitourinary: Negative for difficulty urinating, frequency and vaginal pain.  Musculoskeletal: Negative for back pain and gait problem.  Skin: Positive for rash. Negative for pallor.  Neurological:  Negative for dizziness, tremors, weakness, numbness and headaches.  Psychiatric/Behavioral: Negative for confusion and sleep disturbance.    Objective:  BP 128/78   Pulse (!) 112   Temp 98.5 F (36.9 C) (Oral)   Ht 5' 8.5" (1.74 m)   Wt 143 lb (64.9 kg)   SpO2 99%   BMI 21.43 kg/m   BP Readings from Last 3 Encounters:  11/07/16 128/78  09/27/16 124/80  09/15/16 110/80    Wt Readings from Last 3 Encounters:  11/07/16 143 lb (64.9 kg)  09/27/16 150 lb (68 kg)  09/15/16 150 lb (68 kg)    Physical Exam  Constitutional: She appears well-developed. No distress.  HENT:  Head: Normocephalic.  Right Ear: External ear normal.  Left Ear: External ear normal.  Nose: Nose normal.  Mouth/Throat: Oropharynx is clear and moist.  Eyes: Pupils are equal, round, and reactive to light. Conjunctivae are normal. Right eye exhibits no discharge. Left eye exhibits no discharge.  Neck: Normal range of motion. Neck supple. No JVD present. No tracheal deviation present. No thyromegaly present.  Cardiovascular: Normal rate, regular rhythm and normal heart sounds.   Pulmonary/Chest: No stridor. No respiratory distress. She has no wheezes.  Abdominal: Soft. Bowel sounds are normal. She exhibits no distension and no mass. There is no tenderness. There is no rebound and no guarding.  Musculoskeletal: She exhibits no edema or tenderness.  Lymphadenopathy:    She has no cervical adenopathy.  Neurological: She displays normal reflexes. No  cranial nerve deficit. She exhibits normal muscle tone. Coordination normal.  Skin: No rash noted. No erythema.  Psychiatric: She has a normal mood and affect. Her behavior is normal. Judgment and thought content normal.  dandruff Warts   Procedure Note :     Procedure : Cryosurgery   Indication:  Wart(s)    Risks including unsuccessful procedure , bleeding, infection, bruising, scar, a need for a repeat  procedure and others were explained to the patient in  detail as well as the benefits. Informed consent was obtained verbally.    6 lesion(s)  on  LEs  was/were treated with liquid nitrogen on a Q-tip in a usual fasion . Band-Aid was applied and antibiotic ointment was given for a later use.   Tolerated well. Complications none.      Lab Results  Component Value Date   WBC 12.9 (H) 08/19/2016   HGB 13.2 08/19/2016   HCT 38.4 08/19/2016   PLT 266 08/19/2016   GLUCOSE 97 08/19/2016   CHOL 206 (H) 08/01/2016   TRIG 53.0 08/01/2016   HDL 45.80 08/01/2016   LDLCALC 150 (H) 08/01/2016   ALT 15 08/18/2016   AST 16 08/18/2016   NA 138 08/19/2016   K 4.0 08/19/2016   CL 103 08/19/2016   CREATININE 0.71 08/19/2016   BUN 7 08/19/2016   CO2 26 08/19/2016   TSH 0.52 08/01/2016    Ct Abdomen Pelvis W Contrast  Result Date: 08/19/2016 CLINICAL DATA:  Acute onset of generalized abdominal pain, radiating to the back. Abdominal distention. Nausea. Diaphoresis. Initial encounter. EXAM: CT ABDOMEN AND PELVIS WITH CONTRAST TECHNIQUE: Multidetector CT imaging of the abdomen and pelvis was performed using the standard protocol following bolus administration of intravenous contrast. CONTRAST:  141mL ISOVUE-300 IOPAMIDOL (ISOVUE-300) INJECTION 61% COMPARISON:  None. FINDINGS: Lower chest: Minimal right basilar atelectasis is noted. The visualized portions of the mediastinum are unremarkable. Hepatobiliary: The liver is unremarkable in appearance. The gallbladder is unremarkable in appearance. The common bile duct remains normal in caliber. Pancreas: The pancreas is within normal limits. Spleen: The spleen is unremarkable in appearance. Adrenals/Urinary Tract: The adrenal glands are unremarkable in appearance. The kidneys are within normal limits. There is no evidence of hydronephrosis. No renal or ureteral stones are identified. No perinephric stranding is seen. Stomach/Bowel: The appendix is distended to 1.0 cm near its distal tip, with mild soft tissue  inflammation and mild wall thickening, concerning for mild acute appendicitis. There is no evidence of perforation or abscess formation at this time. The colon is grossly unremarkable in appearance. The small bowel is grossly unremarkable. The stomach is within normal limits. Vascular/Lymphatic: Minimal calcification is noted at the aortic bifurcation. The abdominal aorta is otherwise unremarkable. The inferior vena cava is grossly unremarkable. No retroperitoneal lymphadenopathy is seen. No pelvic sidewall lymphadenopathy is identified. Reproductive: The bladder is mildly distended and grossly unremarkable. The uterus is unremarkable in appearance. An intrauterine device is noted in expected position at the fundus of the uterus. Other: No additional soft tissue abnormalities are seen. Musculoskeletal: No acute osseous abnormalities are identified. The visualized musculature is unremarkable in appearance. IMPRESSION: Mild acute appendicitis, with distention of the appendix to 1.0 cm near its distal tip, surrounding soft tissue inflammation and mild wall thickening. No evidence of perforation or abscess formation at this time. These results were called by telephone at the time of interpretation on 08/19/2016 at 12:55 am to Dr. Pryor Curia, who verbally acknowledged these results. Electronically Signed   By:  Garald Balding M.D.   On: 08/19/2016 00:56    Assessment & Plan:   There are no diagnoses linked to this encounter. I have discontinued Ms. Szwed's levonorgestrel. I am also having her maintain her omeprazole, cyclobenzaprine, gabapentin, lamoTRIgine, amphetamine-dextroamphetamine, cimetidine, cetirizine, and clonazePAM.  No orders of the defined types were placed in this encounter.    Follow-up: No Follow-up on file.  Walker Kehr, MD

## 2016-11-11 ENCOUNTER — Encounter: Payer: Self-pay | Admitting: Gynecology

## 2016-11-12 ENCOUNTER — Encounter: Payer: Self-pay | Admitting: Gynecology

## 2016-11-13 NOTE — Telephone Encounter (Signed)
I am not sure of the indication for hysterectomy.  From a sterilization standpoint options would be vasectomy or tubal sterilization.  Vasectomy as outpatient, but no general anesthesia and quick.  The tubal sterilization requires general anesthesia with a recovery period afterwards.  If she is interested in pursuing sterilization then office visit to discuss

## 2016-11-13 NOTE — Telephone Encounter (Signed)
See prior note

## 2016-11-28 ENCOUNTER — Encounter: Payer: Self-pay | Admitting: Internal Medicine

## 2016-12-03 ENCOUNTER — Other Ambulatory Visit: Payer: Self-pay | Admitting: Internal Medicine

## 2016-12-03 DIAGNOSIS — L509 Urticaria, unspecified: Secondary | ICD-10-CM

## 2016-12-19 ENCOUNTER — Ambulatory Visit (INDEPENDENT_AMBULATORY_CARE_PROVIDER_SITE_OTHER): Payer: 59 | Admitting: Gynecology

## 2016-12-19 ENCOUNTER — Ambulatory Visit (INDEPENDENT_AMBULATORY_CARE_PROVIDER_SITE_OTHER): Payer: 59 | Admitting: Allergy and Immunology

## 2016-12-19 ENCOUNTER — Encounter: Payer: Self-pay | Admitting: Gynecology

## 2016-12-19 ENCOUNTER — Encounter: Payer: Self-pay | Admitting: Allergy and Immunology

## 2016-12-19 VITALS — BP 118/76

## 2016-12-19 VITALS — BP 116/64 | HR 92 | Temp 98.4°F | Resp 16 | Ht 68.5 in | Wt 144.6 lb

## 2016-12-19 DIAGNOSIS — Z87891 Personal history of nicotine dependence: Secondary | ICD-10-CM | POA: Diagnosis not present

## 2016-12-19 DIAGNOSIS — N9089 Other specified noninflammatory disorders of vulva and perineum: Secondary | ICD-10-CM

## 2016-12-19 DIAGNOSIS — K219 Gastro-esophageal reflux disease without esophagitis: Secondary | ICD-10-CM | POA: Diagnosis not present

## 2016-12-19 DIAGNOSIS — J3089 Other allergic rhinitis: Secondary | ICD-10-CM | POA: Diagnosis not present

## 2016-12-19 DIAGNOSIS — L500 Allergic urticaria: Secondary | ICD-10-CM | POA: Diagnosis not present

## 2016-12-19 MED ORDER — EPINEPHRINE 0.3 MG/0.3ML IJ SOAJ: 0.3000 mg | Freq: Once | INTRAMUSCULAR | 3 refills | Status: AC

## 2016-12-19 MED ORDER — MONTELUKAST SODIUM 10 MG PO TABS: 10.0000 mg | ORAL_TABLET | Freq: Every day | ORAL | 5 refills | Status: DC

## 2016-12-19 MED ORDER — RANITIDINE HCL 300 MG PO TABS: 300.0000 mg | ORAL_TABLET | Freq: Every day | ORAL | 5 refills | Status: DC

## 2016-12-19 NOTE — Patient Instructions (Addendum)
  1.  Allergen avoidance measures  2.  Consolidate caffeine consumption and continue nicotine replacements  3.  Every day utilize the following medications:   A.  Cetirizine 10 mg twice a day  B.  Montelukast 10 mg daily  C.  Ranitidine 300 mg daily (replaces cimetidine)  4.  If needed:   A. AUVI-Q 0.3, Benadryl, MD/ER evaluation for allergic  5.  Blood -alpha gal panel, TSH, T4, TP  6.  Further evaluation?  Yes if unresponsive to plan  7.  Return to clinic in 12 weeks or earlier if problem

## 2016-12-19 NOTE — Progress Notes (Signed)
Dear Dr. Alain Marion,  Thank you for referring Christine Duncan to the [facility] on 12/19/2016.   Below is a summation of this patient's evaluation and recommendations.  Thank you for your referral. I will keep you informed about this patient's response to treatment.   If you have any questions please do not hesitate to contact me.   Sincerely,  Jiles Prows, MD Allergy / [facility]   ______________________________________________________________________    NEW PATIENT NOTE  Referring Provider: Cassandria Anger, MD Primary Provider: Cassandria Anger, MD Date of office visit: 12/19/2016    Subjective:   Chief Complaint:  Christine Duncan (DOB: 1980/10/04) is a 36 y.o. female who presents to the clinic on 12/19/2016 with a chief complaint of Urticaria (x 18-19 years induced by stress or friction; steroid shot not helping anymore) .     HPI: [name] presents to this clinic in evaluation of hives.  She has an 18-year history of intermittent urticarial lesions without associated systemic or constitutional symptoms but her pattern changed in 2018 and she now has daily urticarial lesions.  In the past triggers include stress and pressure.  Now she has daily urticaria without any obvious trigger.  She has had development of swelling of her lip and her peri-orbital region and her tongue.  Her lesions never heal with scar or hyperpigmentation.  She believes that her urticaria might be a little bit more active immediately prior to her period and during her period but she does have urticaria throughout her entire cycle.  In the past she has seen a rheumatologist in evaluation of possible lupus contributing to this issue but it has been determined that she does not have a connective tissue disease.  She is usually treated with a steroid shot for this issue.  She has required 3  steroid shots in 2018 for this issue and she does not really think that her steroid administration that occurred during her last 2 flare ups really changed any of the issue with her urticaria.  She has very bad reflux and is presently using a combination of omeprazole and cimetidine.  She had an upper endoscopy about 5 years ago.  She does drink a very large amount of coffee throughout the day and has one Coke per day and chocolate intermittently and rarely alcohol.  She does smoke up until 2 weeks ago and she is now using a nicotine vapor.  She also has a history of nasal congestion and sneezing and itchy watery eyes during  pollination season of the year for which she will take Zyrtec.  She does not have any associated anosmia or ugly nasal discharge or history of sinusitis.  Past Medical History:  Diagnosis Date  . Acid reflux   . Allergy   . Anemia   . Anxiety   . B12 deficiency   . Bipolar 1 disorder ([place])   . Blood type, Rh negative   . Cervical dysplasia   . Depression   . Essential tremor   . Fibromyalgia   . Hives   . IBS (irritable bowel syndrome)   . Neuromuscular disorder ([place])    fibromylagia  . Spastic colon     Past Surgical History:  Procedure Laterality Date  . CERVICAL BIOPSY  W/ LOOP ELECTRODE EXCISION    . CESAREAN SECTION  2006  . CESAREAN SECTION  2011  . COLPOSCOPY    .  DILATION AND CURETTAGE OF UTERUS  2005  . INTRAUTERINE DEVICE INSERTION  05/09/2016  . LAPAROSCOPIC APPENDECTOMY N/A 08/19/2016   Procedure: APPENDECTOMY LAPAROSCOPIC;  Surgeon: Alphonsa Overall, MD;  Location: [facility];  Service: General;  Laterality: N/A;  . 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   cetirizine 10 MG tablet Commonly known as:  ZYRTEC Take 10 mg by mouth daily.   cimetidine 400 MG tablet Commonly known as:  TAGAMET Take 1 tablet (400 mg total) by mouth at bedtime.   clobetasol 0.05 % external solution Commonly known as:  TEMOVATE Apply 1 application topically daily as needed. To scalp prn rash   clonazePAM 0.5 MG disintegrating tablet Commonly known as:  KLONOPIN Take 1 mg by mouth 3 (three) times daily as needed (for anxiety).   cyclobenzaprine 5 MG tablet Commonly known as:  FLEXERIL Take 1 tablet (5 mg total) by mouth 3 (three) times daily as needed for muscle spasms.   gabapentin 300 MG capsule Commonly known as:  NEURONTIN Take 300 mg by mouth 3 (three) times daily as needed (for pain).   ketoconazole 2 % shampoo Commonly known as:  NIZORAL Apply 1 application topically 2 (two) times a week. Use prn   lamoTRIgine 100 MG tablet Commonly known as:  LAMICTAL Take 100 mg by mouth daily.   omeprazole 20 MG capsule Commonly known as:  PRILOSEC Take 2 capsules by mouth daily. Please disregard last RX sent over signed by Dr Linda Hedges   podofilox 0.5 % gel Commonly known as:  CONDYLOX Apply topically 2 (two) times daily.       Review of systems negative except as noted in HPI / PMHx or noted below:  Review of Systems  Constitutional: Negative.   HENT: Negative.   Eyes: Negative.   Respiratory: Negative.   Cardiovascular: Negative.   Gastrointestinal: Negative.   Genitourinary: Negative.   Musculoskeletal: Negative.   Skin: Negative.   Neurological: Negative.   Endo/Heme/Allergies: Negative.   Psychiatric/Behavioral: Negative.     Family History  Problem Relation Age of Onset  . Thyroid disease Mother   . Depression Mother   . Cancer Father 76       esoph ca  . Esophageal cancer Father   . Hyperlipidemia Paternal Grandmother   . Diabetes Paternal Grandmother   . Breast cancer Paternal Grandmother 66  . Cancer Paternal  Grandmother        Bladder and colon cancer  . Colon cancer Paternal Grandmother   . Bipolar disorder Paternal Grandmother   . Stroke Paternal Grandmother   . Seizures Maternal Grandmother   . Thyroid disease Maternal Grandmother   . Depression Maternal Grandmother   . Bipolar disorder Maternal Grandfather   . Alcohol abuse Maternal Grandfather   . Drug abuse Maternal Grandfather   . Allergic rhinitis Brother   . Asthma Paternal Aunt   . Allergic rhinitis Paternal Aunt   . Rectal cancer Neg Hx   . Stomach cancer Neg Hx     Social History   Socioeconomic History  . Marital status: Married    Spouse name: Not on file  .  Number of children: Not on file  . Years of education: Not on file  . Highest education level: Not on file  Social Needs  . Financial resource strain: Not on file  . Food insecurity - worry: Not on file  . Food insecurity - inability: Not on file  . Transportation needs - medical: Not on file  . Transportation needs - non-medical: Not on file  Occupational History  . Occupation: WORKS [employer]    Employer: [employer]    Comment: [employer]  Tobacco Use  . Smoking status: Current Every Day Smoker    Packs/day: 1.00    Types: E-cigarettes  . Smokeless tobacco: Never Used  Substance and Sexual Activity  . Alcohol use: Yes    Alcohol/week: 0.0 oz    Comment: Rare  . Drug use: No  . Sexual activity: Yes    Birth control/protection: Condom    Comment: 1st intercourse 36 yo-Fewer than 5 partners Mirena 05/09/2016   Other Topics Concern  . Not on file  Social History Narrative   Regular exercise- yes    Environmental and Social history  Lives in a townhouse with a dry environment, household, carpet in the bedroom, plastic on the bed, no plastic on the pillow, and terminated her tobacco hobby about 2 weeks ago.  She works in an office setting.  Objective:   Vitals:   12/19/16 0913  BP: 116/64  Pulse: 92  Resp: 16  Temp: 98.4 F (36.9 C)   Height:  5' 8.5" (174 cm) Weight: 144 lb 9.6 oz (65.6 kg)  Physical Exam  Constitutional: She is well-developed, well-nourished, and in no distress.  HENT:  Head: Normocephalic. Head is without right periorbital erythema and without left periorbital erythema.  Right Ear: Tympanic membrane, external ear and ear canal normal.  Left Ear: Tympanic membrane, external ear and ear canal normal.  Nose: Nose normal. No mucosal edema or rhinorrhea.  Mouth/Throat: Uvula is midline, oropharynx is clear and moist and mucous membranes are normal. No oropharyngeal exudate.  Eyes: Conjunctivae and lids are normal. Pupils are equal, round, and reactive to light.  Neck: Trachea normal. No tracheal tenderness present. No tracheal deviation present. No thyromegaly present.  Cardiovascular: Normal rate, regular rhythm, S1 normal, S2 normal and normal heart sounds.  No murmur heard. Pulmonary/Chest: Effort normal and breath sounds normal. No stridor. No tachypnea. No respiratory distress. She has no wheezes. She has no rales. She exhibits no tenderness.  Abdominal: Soft. She exhibits no distension and no mass. There is no hepatosplenomegaly. There is no tenderness. There is no rebound and no guarding.  Musculoskeletal: She exhibits no edema or tenderness.  Lymphadenopathy:       Head (right side): No tonsillar adenopathy present.       Head (left side): No tonsillar adenopathy present.    She has no cervical adenopathy.    She has no axillary adenopathy.  Neurological: She is alert. Gait normal.  Skin: Rash (Scattered urticarial lesions trunk) noted. She is not diaphoretic. No erythema. No pallor. Nails show no clubbing.  Psychiatric: Mood and affect normal.    Diagnostics: Allergy skin tests were performed.  She demonstrated hypersensitivity to grasses and weeds.  Results of blood tests obtained 18 August 2016 and 01 August 2016 identified normal hepatic and renal function, WBC 8.0, absolute eosinophil 100, absolute  lymphocyte 2400, hemoglobin 14.6, platelet 265.  Assessment and Plan:    1. Allergic urticaria   2. Other allergic rhinitis   3. Gastroesophageal  reflux disease, esophagitis presence not specified   4. History of tobacco use     1.  Allergen avoidance measures  2.  Consolidate caffeine consumption and continue nicotine replacements  3.  Every day utilize the following medications:   A.  Cetirizine 10 mg twice a day  B.  Montelukast 10 mg daily  C.  Ranitidine 300 mg daily (replaces cimetidine)  4.  If needed:   A. AUVI-Q 0.3, Benadryl, MD/ER evaluation for allergic  5.  Blood -alpha gal panel, TSH, T4, TP  6.  Further evaluation?  Yes if unresponsive to plan  7.  Return to clinic in 12 weeks or earlier if problem  Rukia has some form of immunological hyperreactivity with unknown etiologic factor and I will obtain the blood tests noted above in investigation for the cause of this hyperactivity.  We will try a collection of medical therapy to decrease her immunological hyperactivity as noted above.  She will continue on her proton pump inhibitor while utilizing ranitidine to replace her cimetidine and I have encouraged her to consolidate all of her caffeine consumption to help with her reflux disease.  Of course she should remain away from all tobacco smoke exposure and she can continue her nicotine replacements.  I will regroup with her in 12 weeks or earlier if there is a problem.  Jiles Prows, MD Allergy / Immunology [facility]

## 2016-12-19 NOTE — Progress Notes (Signed)
    Christine Duncan 1980/09/06 [id number]        36 y.o.  [id number] presents complaining of several months of a raised area in her vulvar/perianal region that bothers her.  She has been picking at it and it is somewhat irritating to her.  No other lesions noted.  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  Past medical history,surgical history, problem list, medications, allergies, family history and social history were all reviewed and documented in the EPIC chart.  Directed ROS with pertinent positives and negatives documented in the history of present illness/assessment and plan.  Exam: [name] assistant Vitals:   12/19/16 1625  BP: 118/76   General appearance:  Normal External BUS vagina with small raised condylomatous appearing lesion left perianal region above the anus.  No other vulvar lesions noted.  No vaginal lesions palpated. Physical Exam  Genitourinary:      Procedure: The skin surrounding the skin lesion was cleansed with Betadine and infiltrated with 1% lidocaine.  The skin lesion was excised in its entirety with silver nitrate hemostasis applied afterwards.  Postoperative instructions given to the patient.  Assessment/Plan:  36 y.o. [id number] with a raised condylomatous appearing skin lesion.  Acetic acid was applied with faint accentuation in its appearance but no classic acetowhite change.  Lesion was excised in its entirety.  Postoperative instructions were given to the patient.  She will follow-up for biopsy results.  Various possibilities reviewed to include skin tag, condyloma, VIN.    Anastasio Auerbach MD, 4:35 PM 12/19/2016

## 2016-12-19 NOTE — Patient Instructions (Signed)
Office will call you with biopsy results 

## 2016-12-20 ENCOUNTER — Encounter: Payer: Self-pay | Admitting: Allergy and Immunology

## 2016-12-21 LAB — PATHOLOGY

## 2016-12-21 LAB — TISSUE SPECIMEN

## 2016-12-22 LAB — ALPHA-GAL PANEL
Alpha Gal IgE*: <0.1 kU/L (ref <0.10)
BEEF CLASS INTERPRETATION: 0
Class Interpretation: 0
LAMB CLASS INTERPRETATION: 0
Lamb/Mutton (Ovis spp) IgE: <0.1 kU/L (ref <0.35)

## 2016-12-22 LAB — TSH: TSH: 0.585 u[IU]/mL (ref 0.450–4.500)

## 2016-12-22 LAB — T4, FREE: FREE T4: 1.11 ng/dL (ref 0.82–1.77)

## 2016-12-22 LAB — THYROID PEROXIDASE ANTIBODY: THYROID PEROXIDASE ANTIBODY: 12 [IU]/mL (ref 0–34)

## 2017-01-02 ENCOUNTER — Encounter: Payer: Self-pay | Admitting: Internal Medicine

## 2017-01-02 ENCOUNTER — Ambulatory Visit (INDEPENDENT_AMBULATORY_CARE_PROVIDER_SITE_OTHER): Payer: 59 | Admitting: Internal Medicine

## 2017-01-02 DIAGNOSIS — D229 Melanocytic nevi, unspecified: Secondary | ICD-10-CM | POA: Diagnosis not present

## 2017-01-02 NOTE — Progress Notes (Signed)
   Subjective:    Patient ID: Christine Duncan, female    DOB: 05/17/1980, 36 y.o.   MRN: [id number]  HPI The patient is a 36 YO female coming in for spot on her right leg. She has had it for some time. She noticed it growing some in the last 1 month and raising slightly. She denies pain or itching. She does use sunscreen. Denies personal history of skin cancer.   Review of Systems  Constitutional: Negative.   HENT: Negative.   Respiratory: Negative.   Cardiovascular: Negative.   Gastrointestinal: Negative.   Musculoskeletal: Negative.   Skin: Positive for color change.  Psychiatric/Behavioral: Negative.       Objective:   Physical Exam  Constitutional: She is oriented to person, place, and time. She appears well-developed and well-nourished.  HENT:  Head: Normocephalic and atraumatic.  Eyes: EOM are normal.  Neck: Normal range of motion.  Cardiovascular: Normal rate and regular rhythm.  Pulmonary/Chest: Effort normal and breath sounds normal. No respiratory distress. She has no wheezes. She has no rales.  Abdominal: Soft.  Musculoskeletal: She exhibits no edema.  Neurological: She is alert and oriented to person, place, and time. Coordination normal.  Skin: Skin is warm and dry.  1-2 mm mole on the right leg which is not suspicious appearing.   Vitals:   01/02/17 1611  BP: 118/82  Pulse: 96  Temp: 98.5 F (36.9 C)  TempSrc: Oral  SpO2: 99%  Weight: 144 lb (65.3 kg)  Height: 5' 8.5" (1.74 m)      Assessment & Plan:

## 2017-01-02 NOTE — Assessment & Plan Note (Signed)
Reassurance given today, she will monitor the mole and if growing or changing colors can get re-evaluated.

## 2017-01-12 ENCOUNTER — Other Ambulatory Visit: Payer: Self-pay | Admitting: Internal Medicine

## 2017-01-18 ENCOUNTER — Encounter: Payer: Self-pay | Admitting: Gynecology

## 2017-01-22 ENCOUNTER — Encounter: Payer: 59 | Admitting: Gynecology

## 2017-01-31 ENCOUNTER — Encounter: Payer: Self-pay | Admitting: Gynecology

## 2017-02-05 ENCOUNTER — Encounter: Payer: 59 | Admitting: Gynecology

## 2017-02-07 ENCOUNTER — Ambulatory Visit (INDEPENDENT_AMBULATORY_CARE_PROVIDER_SITE_OTHER): Payer: 59 | Admitting: Gynecology

## 2017-02-07 ENCOUNTER — Encounter: Payer: Self-pay | Admitting: Gynecology

## 2017-02-07 VITALS — BP 116/72 | Ht 68.0 in | Wt 141.0 lb

## 2017-02-07 DIAGNOSIS — Z01419 Encounter for gynecological examination (general) (routine) without abnormal findings: Secondary | ICD-10-CM | POA: Diagnosis not present

## 2017-02-07 DIAGNOSIS — Z1151 Encounter for screening for human papillomavirus (HPV): Secondary | ICD-10-CM | POA: Diagnosis not present

## 2017-02-07 DIAGNOSIS — Z3009 Encounter for other general counseling and advice on contraception: Secondary | ICD-10-CM

## 2017-02-07 NOTE — Addendum Note (Signed)
Addended by: Nelva Nay on: 02/07/2017 11:33 AM   Modules accepted: Orders

## 2017-02-07 NOTE — Progress Notes (Signed)
    Christine Duncan 15-Nov-1980 [id number]        36 y.o.  [id number] for annual gynecologic exam.  Doing well without gynecologic complaints.  Past medical history,surgical history, problem list, medications, allergies, family history and social history were all reviewed and documented as reviewed in the EPIC chart.  ROS:  Performed with pertinent positives and negatives included in the history, assessment and plan.   Additional significant findings : None   Exam: Christine Duncan assistant Vitals:   02/07/17 1026  BP: 116/72  Weight: 141 lb (64 kg)  Height: 5\' 8"  (1.727 m)   Body mass index is 21.44 kg/m.  General appearance:  Normal affect, orientation and appearance. Skin: Grossly normal HEENT: Without gross lesions.  No cervical or supraclavicular adenopathy. Thyroid normal.  Lungs:  Clear without wheezing, rales or rhonchi Cardiac: RR, without RMG Abdominal:  Soft, nontender, without masses, guarding, rebound, organomegaly or hernia Breasts:  Examined lying and sitting without masses, retractions, discharge or axillary adenopathy. Pelvic:  Ext, BUS, Vagina: Normal  Cervix: Normal.  Pap smear/HPV  Uterus: Anteverted, normal size, shape and contour, midline and mobile nontender   Adnexa: Without masses or tenderness    Anus and perineum: Normal   Rectovaginal: Normal sphincter tone without palpated masses or tenderness.    Assessment/Plan:  37 y.o. [name] female for annual gynecologic exam with regular menses, no contraception.   1. Contraception.  I again reviewed options for contraception with the patient.  At 55 and history of smoking I reviewed the risks of estrogen containing products to include increased risk of thrombosis.  Alternatives to include Nexplanon, Depo-Provera, retrial of IUD is noting she did not have a good experience with the last Mirena IUD and sterilization.  Patient unclear what she wants to do but is not ready to commit at this time.  Stressed the need for  condoms with Plan B backup for now. 2. Pap smear 2016.   Pap smear/HPV done today.  History of LEEP for CIN-2 2012.  Clear margins.  Follow-up Pap smears negative.  3. Breast exam normal.  SBE monthly reviewed.  Plan baseline mammogram at age 49. 37. Stop smoking.  Patient has stopped cigarette smoking but vapes.  Need to taper from this reviewed.  Congratulations and encouragement given. 5. Health maintenance.  Patient plans to have baseline lab work done through her primary physician's office.  Follow-up with contraceptive decision.  Follow-up in 1 year for annual exam.   Anastasio Auerbach MD, 10:59 AM 02/07/2017

## 2017-02-07 NOTE — Patient Instructions (Signed)
Follow-up with your contraceptive decision.  Follow-up in 1 year for annual exam.

## 2017-02-12 LAB — PAP IG AND HPV HIGH-RISK: HPV DNA HIGH RISK: NOT DETECTED

## 2017-02-18 ENCOUNTER — Other Ambulatory Visit: Payer: Self-pay

## 2017-02-18 ENCOUNTER — Encounter (HOSPITAL_COMMUNITY): Payer: Self-pay

## 2017-02-18 ENCOUNTER — Emergency Department (HOSPITAL_COMMUNITY)
Admission: EM | Admit: 2017-02-18 | Discharge: 2017-02-19 | Disposition: A | Discharge status: Home / Self Care | Payer: 59 | Attending: Emergency Medicine | Admitting: Emergency Medicine

## 2017-02-18 DIAGNOSIS — R064 Hyperventilation: Secondary | ICD-10-CM | POA: Insufficient documentation

## 2017-02-18 DIAGNOSIS — R200 Anesthesia of skin: Secondary | ICD-10-CM | POA: Insufficient documentation

## 2017-02-18 DIAGNOSIS — Z87891 Personal history of nicotine dependence: Secondary | ICD-10-CM | POA: Insufficient documentation

## 2017-02-18 DIAGNOSIS — I10 Essential (primary) hypertension: Secondary | ICD-10-CM | POA: Diagnosis not present

## 2017-02-18 DIAGNOSIS — Z79899 Other long term (current) drug therapy: Secondary | ICD-10-CM | POA: Insufficient documentation

## 2017-02-18 DIAGNOSIS — R55 Syncope and collapse: Secondary | ICD-10-CM | POA: Diagnosis present

## 2017-02-18 NOTE — ED Triage Notes (Signed)
Pt arrived via [facility]; per EMS pt was at home in  [place] and became unresponsive ; accroding to husband the pt did not fall; pt was sitting in chair upon EMS arrival. No hx of seizures; denies alcohol or drug use

## 2017-02-19 LAB — COMPREHENSIVE METABOLIC PANEL
ALT: 18 U/L (ref 14–54)
ANION GAP: 11 (ref 5–15)
AST: 20 U/L (ref 15–41)
Albumin: 3.5 g/dL (ref 3.5–5.0)
Alkaline Phosphatase: 56 U/L (ref 38–126)
BILIRUBIN TOTAL: 0.5 mg/dL (ref 0.3–1.2)
BUN: 9 mg/dL (ref 6–20)
CO2: 22 mmol/L (ref 22–32)
Calcium: 8.7 mg/dL — ABNORMAL LOW (ref 8.9–10.3)
Chloride: 106 mmol/L (ref 101–111)
Creatinine, Ser: 0.67 mg/dL (ref 0.44–1.00)
GFR calc Af Amer: >60 mL/min (ref >60)
Glucose, Bld: 111 mg/dL — ABNORMAL HIGH (ref 65–99)
POTASSIUM: 3.5 mmol/L (ref 3.5–5.1)
Sodium: 139 mmol/L (ref 135–145)
TOTAL PROTEIN: 6 g/dL — AB (ref 6.5–8.1)

## 2017-02-19 LAB — CBC WITH DIFFERENTIAL/PLATELET
BASOS ABS: 0 10*3/uL (ref 0.0–0.1)
BASOS PCT: 1 %
EOS PCT: 1 %
Eosinophils Absolute: 0 10*3/uL (ref 0.0–0.7)
HCT: 36.1 % (ref 36.0–46.0)
Hemoglobin: 12.6 g/dL (ref 12.0–15.0)
LYMPHS PCT: 26 %
Lymphs Abs: 2.2 10*3/uL (ref 0.7–4.0)
MCH: 30.4 pg (ref 26.0–34.0)
MCHC: 34.9 g/dL (ref 30.0–36.0)
MCV: 87 fL (ref 78.0–100.0)
Monocytes Absolute: 0.4 10*3/uL (ref 0.1–1.0)
Monocytes Relative: 5 %
NEUTROS ABS: 5.6 10*3/uL (ref 1.7–7.7)
Neutrophils Relative %: 67 %
PLATELETS: 269 10*3/uL (ref 150–400)
RBC: 4.15 MIL/uL (ref 3.87–5.11)
RDW: 12.5 % (ref 11.5–15.5)
WBC: 8.2 10*3/uL (ref 4.0–10.5)

## 2017-02-19 LAB — I-STAT BETA HCG BLOOD, ED (MC, WL, AP ONLY)

## 2017-02-19 LAB — I-STAT TROPONIN, ED: TROPONIN I, POC: 0 ng/mL (ref 0.00–0.08)

## 2017-02-19 LAB — I-STAT CG4 LACTIC ACID, ED: LACTIC ACID, VENOUS: 1.27 mmol/L (ref 0.5–1.9)

## 2017-02-19 MED ORDER — SODIUM CHLORIDE 0.9 % IV BOLUS (SEPSIS)
1000.0000 mL | Freq: Once | INTRAVENOUS | Status: AC
  Administered 2017-02-19: 1000 mL via INTRAVENOUS

## 2017-02-19 NOTE — ED Provider Notes (Signed)
[facility] EMERGENCY DEPARTMENT Provider Note   CSN: [id number] Arrival date & time: 02/18/17  2313     History   Chief Complaint Chief Complaint  Patient presents with  . Near Syncope    HPI Christine Duncan is a 37 y.o. female.  The history is provided by the patient.  Near Syncope   She has a history of bipolar disorder, fibromyalgia, GERD and comes in following a near syncopal episode at home.  She was sitting down doing some work on her computer when she started feeling lightheaded and developed tunnel vision.  She developed nausea and got very sweaty.  Her husband noted that she had about 32nd episode where she was not responding to him.  She was sitting the entire time.  Following that episode, he noted that her hands were drawn up.  She noted that there was numbness in her hands and feet, dry mouth, and ongoing lightheadedness.  After about 20 minutes, all symptoms resolved and she feels she is back to her baseline.  There was no actual seizure activity noted and there was no bit lip or tongue or incontinence.  She has not had any similar episodes in the past.  Last menses was 1 week ago.  She states she is currently sexually abstinent.  Past Medical History:  Diagnosis Date  . Acid reflux   . Allergy   . Anemia   . Anxiety   . B12 deficiency   . Bipolar 1 disorder ([place])   . Blood type, Rh negative   . Cervical dysplasia   . Depression   . Essential tremor   . Fibromyalgia   . Hives   . IBS (irritable bowel syndrome)   . Neuromuscular disorder ([place])    fibromylagia  . Spastic colon     Patient Active Problem List   Diagnosis Date Noted  . Dandruff in adult 11/07/2016  . Benign mole 09/15/2016  . Throat swelling 08/24/2016  . Thrush 08/24/2016  . Smoker 08/24/2016  . Acute appendicitis 08/19/2016  . Well adult exam 08/01/2016  . Acute tonsillitis 06/28/2016  . Hypokalemia 07/29/2014  . Sialoadenitis 07/29/2014  . Warts 07/29/2014  .  Shingles outbreak 12/10/2013  . Hoarseness 12/04/2013  . Knee MCL sprain 09/02/2013  . Acute meniscal tear of left knee 09/02/2013  . Pes anserine bursitis 09/02/2013  . Pes planus 09/02/2013  . Bipolar I disorder, most recent episode (or current) manic ([place]) 11/27/2012  . Dyslipidemia 11/07/2012  . Fibromyalgia muscle pain 09/13/2011  . Low back pain 09/13/2011  . Spastic colon   . Essential tremor   . Rosacea 08/02/2010  . EYE FLOATERS 03/29/2010  . Essential hypertension 01/25/2010  . SWEATING 01/25/2010  . Tobacco use 10/21/2008  . FATIGUE 10/21/2008  . URTICARIA 06/06/2008  . ESOPHAGITIS 05/08/2008  . GASTRITIS, CHRONIC 05/08/2008  . COLITIS 05/08/2008  . IBS 05/08/2008  . DYSTONIA 04/20/2008  . GERD 04/13/2008  . B12 deficiency 01/20/2008  . INSOMNIA, PERSISTENT 01/20/2008  . Myoclonus 01/20/2008  . 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 SYNCOPE 08/25/2006    Past Surgical History:  Procedure Laterality Date  . CERVICAL BIOPSY  W/ LOOP ELECTRODE EXCISION    . CESAREAN SECTION  2006  . CESAREAN SECTION  2011  . COLPOSCOPY    . DILATION AND CURETTAGE OF UTERUS  2005  . INTRAUTERINE DEVICE INSERTION  05/09/2016  . LAPAROSCOPIC APPENDECTOMY N/A 08/19/2016   Procedure: APPENDECTOMY LAPAROSCOPIC;  Surgeon:  Alphonsa Overall, MD;  Location: [facility];  Service: General;  Laterality: N/A;  . LEEP  07/2010   CIN II, Margins free    OB History    Gravida Para Term Preterm AB Living   5 2 2   3 2    SAB TAB Ectopic Multiple Live Births   3               Home Medications    Prior to Admission medications   Medication Sig Start Date End Date Taking? Authorizing Provider  amphetamine-dextroamphetamine (ADDERALL) 20 MG tablet Take 20 mg by mouth daily as needed (to focus).     [provider]  cetirizine (ZYRTEC) 10 MG tablet Take 10 mg by mouth daily.    [provider]  cimetidine (TAGAMET) 400 MG tablet Take 1 tablet (400 mg total) by  mouth at bedtime. 08/01/16   Plotnikov, Evie Lacks, MD  clobetasol (TEMOVATE) 0.05 % external solution Apply 1 application topically daily as needed. To scalp prn rash Patient not taking: Reported on 12/19/2016 11/07/16   Plotnikov, Evie Lacks, MD  clonazePAM (KLONOPIN) 0.5 MG disintegrating tablet Take 1 mg by mouth 3 (three) times daily as needed (for anxiety).    [provider]  CONDYLOX 0.5 % gel APPLY TOPICALLY TWICE A DAY 01/17/17   Plotnikov, Evie Lacks, MD  cyclobenzaprine (FLEXERIL) 5 MG tablet Take 1 tablet (5 mg total) by mouth 3 (three) times daily as needed for muscle spasms. 12/04/13   Plotnikov, Evie Lacks, MD  gabapentin (NEURONTIN) 300 MG capsule Take 300 mg by mouth 3 (three) times daily as needed (for pain).     [provider]  ketoconazole (NIZORAL) 2 % shampoo Apply 1 application topically 2 (two) times a week. Use prn Patient not taking: Reported on 01/02/2017 11/09/16   Plotnikov, Evie Lacks, MD  lamoTRIgine (LAMICTAL) 100 MG tablet Take 100 mg by mouth daily.    [provider]  montelukast (SINGULAIR) 10 MG tablet Take 1 tablet (10 mg total) by mouth at bedtime. 12/19/16   Kozlow, Donnamarie Poag, MD  omeprazole (PRILOSEC) 20 MG capsule Take 2 capsules by mouth daily. Please disregard last RX sent over signed by Dr Linda Hedges Patient taking differently: Take 40 mg by mouth daily.  02/27/13   Plotnikov, Evie Lacks, MD  ranitidine (ZANTAC) 300 MG tablet Take 1 tablet (300 mg total) by mouth at bedtime. Patient not taking: Reported on 02/07/2017 12/19/16   Jiles Prows, MD    Family History Family History  Problem Relation Age of Onset  . Thyroid disease Mother   . Depression Mother   . Cancer Father 1       esoph ca  . Esophageal cancer Father   . Hyperlipidemia Paternal Grandmother   . Diabetes Paternal Grandmother   . Breast cancer Paternal Grandmother 31  . Cancer Paternal Grandmother        Bladder and colon cancer  . Colon cancer Paternal Grandmother     . Bipolar disorder Paternal Grandmother   . Stroke Paternal Grandmother   . Seizures Maternal Grandmother   . Thyroid disease Maternal Grandmother   . Depression Maternal Grandmother   . Bipolar disorder Maternal Grandfather   . Alcohol abuse Maternal Grandfather   . Drug abuse Maternal Grandfather   . Allergic rhinitis Brother   . Asthma Paternal Aunt   . Allergic rhinitis Paternal Aunt   . Rectal cancer Neg Hx   . Stomach cancer Neg Hx  Social History Social History   Tobacco Use  . Smoking status: Former Smoker    Packs/day: 1.00    Types: E-cigarettes  . Smokeless tobacco: Never Used  Substance Use Topics  . Alcohol use: Yes    Alcohol/week: 0.0 oz    Comment: Rare  . Drug use: No     Allergies   Carbidopa-levodopa and Sertraline hcl   Review of Systems Review of Systems  Cardiovascular: Positive for near-syncope.  All other systems reviewed and are negative.    Physical Exam Updated Vital Signs Pulse 74   Resp 16   Ht 5\' 8"  (1.727 m)   Wt 64.4 kg (142 lb)   LMP 02/17/2017   SpO2 100%   BMI 21.59 kg/m   Physical Exam  Nursing note and vitals reviewed.  37 year old female, resting comfortably and in no acute distress. Vital signs are normal. Oxygen saturation is 100%, which is normal. Head is normocephalic and atraumatic. PERRLA, EOMI. Oropharynx is clear. Neck is nontender and supple without adenopathy or JVD. Back is nontender and there is no CVA tenderness. Lungs are clear without rales, wheezes, or rhonchi. Chest is nontender. Heart has regular rate and rhythm without murmur. Abdomen is soft, flat, nontender without masses or hepatosplenomegaly and peristalsis is normoactive. Extremities have no cyanosis or edema, full range of motion is present. Skin is warm and dry without rash. Neurologic: Mental status is normal, cranial nerves are intact, there are no motor or sensory deficits.  ED Treatments / Results  Labs (all labs ordered are  listed, but only abnormal results are displayed) Labs Reviewed  COMPREHENSIVE METABOLIC PANEL - Abnormal; Notable for the following components:      Result Value   Glucose, Bld 111 (*)    Calcium 8.7 (*)    Total Protein 6.0 (*)    All other components within normal limits  CBC WITH DIFFERENTIAL/PLATELET  I-STAT CG4 LACTIC ACID, ED  I-STAT BETA HCG BLOOD, ED ([facility], [facility], [facility] ONLY)  I-STAT TROPONIN, ED  I-STAT CG4 LACTIC ACID, ED    EKG  EKG Interpretation  Date/Time:  Monday February 19 2017 02:28:42 EST Ventricular Rate:  77 PR Interval:    QRS Duration: 102 QT Interval:  415 QTC Calculation: 470 R Axis:   88 Text Interpretation:  Sinus rhythm Normal ECG When compared with ECG of 01/06/2008, No significant change was found Confirmed by Delora Fuel ([id number]) on 02/19/2017 2:42:26 AM      Procedures Procedures (including critical care time)  Medications Ordered in ED Medications  sodium chloride 0.9 % bolus 1,000 mL (0 mLs Intravenous Stopped 02/19/17 0335)     Initial Impression / Assessment and Plan / ED Course  I have reviewed the triage vital signs and the nursing notes.  Pertinent lab results that were available during my care of the patient were reviewed by me and considered in my medical decision making (see chart for details).  Near syncopal episode having characteristics of vasovagal episode and hyperventilation.  No red flags to suggest more serious pathology.  I suspect that had she allowed herself to lay down, she probably would have done much better.  However, she states sitting throughout the entire event.  Screening labs are obtained as well as ECG.  We will give some IV fluids.  Old records are reviewed, and she has no relevant past visits.  Laboratory workup is unremarkable.  Patient has remained symptom-free in the ED.  At this point, no indication of anything  other than the vasovagal episode.  I discussed this with patient.  She is to follow-up with PCP, return  precautions discussed.  Final Clinical Impressions(s) / ED Diagnoses   Final diagnoses:  Near syncope  Vasovagal episode    ED Discharge Orders    None       Delora Fuel, MD 27/07/86 [phone]

## 2017-02-19 NOTE — ED Notes (Signed)
Patient up to bathroom. No distress observed. 

## 2017-03-27 ENCOUNTER — Telehealth: Payer: Self-pay | Admitting: *Deleted

## 2017-03-27 ENCOUNTER — Encounter: Payer: Self-pay | Admitting: *Deleted

## 2017-03-27 ENCOUNTER — Encounter: Payer: Self-pay | Admitting: Internal Medicine

## 2017-03-27 NOTE — Telephone Encounter (Signed)
With regular menses it would be unlikely that she would have a early menopause issue.  Other reasons for hot flushes is thyroid dysfunction well as metabolic issues like diabetes.  Lastly she is on a number of medications which side effects can also lead to symptoms.  I would recommend starting with her primary physician for evaluation.

## 2017-03-27 NOTE — Telephone Encounter (Signed)
Pt called asking if "hormones" can be checked, c/o being moody, hot flashes at night and acne. States her mother was menopausal at earlier age. OV offered, pt was hoping labs can be placed. Has PCP, but thought since its hormone related she would check you first.  Please advise

## 2017-03-27 NOTE — Telephone Encounter (Signed)
Sent mychart message

## 2017-04-03 ENCOUNTER — Encounter: Payer: Self-pay | Admitting: Physician Assistant

## 2017-04-03 ENCOUNTER — Ambulatory Visit (INDEPENDENT_AMBULATORY_CARE_PROVIDER_SITE_OTHER): Payer: 59 | Admitting: Physician Assistant

## 2017-04-03 VITALS — BP 126/80 | HR 101 | Temp 98.3°F | Ht 68.0 in | Wt 143.2 lb

## 2017-04-03 DIAGNOSIS — Z833 Family history of diabetes mellitus: Secondary | ICD-10-CM

## 2017-04-03 DIAGNOSIS — L709 Acne, unspecified: Secondary | ICD-10-CM

## 2017-04-03 DIAGNOSIS — R238 Other skin changes: Secondary | ICD-10-CM | POA: Diagnosis not present

## 2017-04-03 LAB — COMPREHENSIVE METABOLIC PANEL
ALK PHOS: 67 U/L (ref 39–117)
ALT: 14 U/L (ref 0–35)
AST: 12 U/L (ref 0–37)
Albumin: 4.2 g/dL (ref 3.5–5.2)
BILIRUBIN TOTAL: 0.5 mg/dL (ref 0.2–1.2)
BUN: 11 mg/dL (ref 6–23)
CO2: 27 mEq/L (ref 19–32)
CREATININE: 0.77 mg/dL (ref 0.40–1.20)
Calcium: 9.2 mg/dL (ref 8.4–10.5)
Chloride: 102 mEq/L (ref 96–112)
GFR: 88.8 mL/min (ref >60.00)
GLUCOSE: 105 mg/dL — AB (ref 70–99)
Potassium: 3.7 mEq/L (ref 3.5–5.1)
SODIUM: 137 meq/L (ref 135–145)
Total Protein: 6.6 g/dL (ref 6.0–8.3)

## 2017-04-03 LAB — CBC WITH DIFFERENTIAL/PLATELET
BASOS ABS: 0 10*3/uL (ref 0.0–0.1)
Basophils Relative: 0.9 % (ref 0.0–3.0)
Eosinophils Absolute: 0 10*3/uL (ref 0.0–0.7)
Eosinophils Relative: 0.8 % (ref 0.0–5.0)
HCT: 40.3 % (ref 36.0–46.0)
Hemoglobin: 13.7 g/dL (ref 12.0–15.0)
Lymphocytes Relative: 35.6 % (ref 12.0–46.0)
Lymphs Abs: 2 10*3/uL (ref 0.7–4.0)
MCHC: 34 g/dL (ref 30.0–36.0)
MCV: 87.2 fl (ref 78.0–100.0)
MONO ABS: 0.3 10*3/uL (ref 0.1–1.0)
Monocytes Relative: 6.2 % (ref 3.0–12.0)
Neutro Abs: 3.1 10*3/uL (ref 1.4–7.7)
Neutrophils Relative %: 56.5 % (ref 43.0–77.0)
Platelets: 309 10*3/uL (ref 150.0–400.0)
RBC: 4.63 Mil/uL (ref 3.87–5.11)
RDW: 12.7 % (ref 11.5–15.5)
WBC: 5.5 10*3/uL (ref 4.0–10.5)

## 2017-04-03 LAB — TSH: TSH: 0.82 u[IU]/mL (ref 0.35–4.50)

## 2017-04-03 LAB — T4, FREE: Free T4: 0.84 ng/dL (ref 0.60–1.60)

## 2017-04-03 LAB — HEMOGLOBIN A1C: HEMOGLOBIN A1C: 5.5 % (ref 4.6–6.5)

## 2017-04-03 MED ORDER — DOXYCYCLINE HYCLATE 100 MG PO TABS: 100.0000 mg | ORAL_TABLET | Freq: Every day | ORAL | 0 refills | Status: AC

## 2017-04-03 MED ORDER — KETOCONAZOLE 2 % EX SHAM: 1.0000 "application " | MEDICATED_SHAMPOO | CUTANEOUS | 3 refills | Status: DC

## 2017-04-03 MED ORDER — FLUCONAZOLE 150 MG PO TABS: 150.0000 mg | ORAL_TABLET | Freq: Once | ORAL | 0 refills | Status: AC

## 2017-04-03 NOTE — Patient Instructions (Signed)
Apply Ketoconazole shampoo to affected areas as needed.  Start oral doxycycline daily. See PCP for more refills. I do recommend going to Dermatology for further evaluation.  Avoid prolonged sun exposure with doxycycline.   We will call you with your lab results.

## 2017-04-03 NOTE — Progress Notes (Signed)
Christine Duncan is a 37 y.o. female here for a new problem.  I acted as a Education administrator for [employer], PA-C Anselmo Pickler, LPN  History of Present Illness:   Chief Complaint  Patient presents with  . Acne    Other  This is a new problem. Episode onset: Pt c/o acne breakout times several months. The problem occurs daily. The problem has been gradually worsening. Associated symptoms comments: Breakout on face, neck, chest and back and starting to come down arms.. Treatments tried: Differin on face, started last week but not everynight. The treatment provided no relief.   Has not had issues with acne in the past. Called her ob-gyn to have hormone levels tested as she has been having night sweats, moodiness. Under stress as work. Has had shingles twice, most recently a year or two ago. Nothing new in the environment that she is aware of. Does have hx of urticaria.  Wt Readings from Last 3 Encounters:  04/03/17 143 lb 4 oz (65 kg)  02/18/17 142 lb (64.4 kg)  02/07/17 141 lb (64 kg)    Has IBS so fluctuates between diarrhea or constipation. No travel. No open areas.  Also has family history of diabetes and recently a few of her family members have been diagnosed with DM. She is requesting a HgbA1c. She denies polyuria, polyphagia. Denies recent changes to medications.    Past Medical History:  Diagnosis Date  . Acid reflux   . Allergy   . Anemia   . Anxiety   . B12 deficiency   . Bipolar 1 disorder ([place])   . Blood type, Rh negative   . Cervical dysplasia   . Depression   . Essential tremor   . Fibromyalgia   . Hives   . IBS (irritable bowel syndrome)   . Neuromuscular disorder ([place])    fibromylagia  . Spastic colon      Social History   Socioeconomic History  . Marital status: Married    Spouse name: Not on file  . Number of children: Not on file  . Years of education: Not on file  . Highest education level: Not on file  Social Needs  . Financial resource strain:  Not on file  . Food insecurity - worry: Not on file  . Food insecurity - inability: Not on file  . Transportation needs - medical: Not on file  . Transportation needs - non-medical: Not on file  Occupational History  . Occupation: WORKS [employer]    Employer: [employer]    Comment: [employer]  Tobacco Use  . Smoking status: Former Smoker    Packs/day: 1.00    Types: E-cigarettes  . Smokeless tobacco: Never Used  Substance and Sexual Activity  . Alcohol use: Yes    Alcohol/week: 0.0 oz    Comment: Rare  . Drug use: No  . Sexual activity: Yes    Birth control/protection: Condom    Comment: 1st intercourse 37 yo-Fewer than 5 partners Mirena 05/09/2016   Other Topics Concern  . Not on file  Social History Narrative   Regular exercise- yes    Past Surgical History:  Procedure Laterality Date  . CERVICAL BIOPSY  W/ LOOP ELECTRODE EXCISION    . CESAREAN SECTION  2006  . CESAREAN SECTION  2011  . COLPOSCOPY    . DILATION AND CURETTAGE OF UTERUS  2005  . INTRAUTERINE DEVICE INSERTION  05/09/2016  . LAPAROSCOPIC APPENDECTOMY N/A 08/19/2016   Procedure: APPENDECTOMY LAPAROSCOPIC;  Surgeon:  Alphonsa Overall, MD;  Location: [facility];  Service: General;  Laterality: N/A;  . LEEP  07/2010   CIN II, Margins free    Family History  Problem Relation Age of Onset  . Thyroid disease Mother   . Depression Mother   . Cancer Father 18       esoph ca  . Esophageal cancer Father   . Hyperlipidemia Paternal Grandmother   . Diabetes Paternal Grandmother   . Breast cancer Paternal Grandmother 75  . Cancer Paternal Grandmother        Bladder and colon cancer  . Colon cancer Paternal Grandmother   . Bipolar disorder Paternal Grandmother   . Stroke Paternal Grandmother   . Seizures Maternal Grandmother   . Thyroid disease Maternal Grandmother   . Depression Maternal Grandmother   . Diabetes Maternal Grandmother   . Bipolar disorder Maternal Grandfather   . Alcohol abuse Maternal Grandfather   .  Drug abuse Maternal Grandfather   . Allergic rhinitis Brother   . Asthma Paternal Aunt   . Allergic rhinitis Paternal Aunt   . Diabetes Paternal Grandfather   . Rectal cancer Neg Hx   . Stomach cancer Neg Hx     Allergies  Allergen Reactions  . Carbidopa-Levodopa Nausea And Vomiting  . Sertraline Hcl Other (See Comments)    Reaction:  Manic episode    Current Medications:   Current Outpatient Medications:  .  amphetamine-dextroamphetamine (ADDERALL) 20 MG tablet, Take 20 mg by mouth daily as needed (to focus). , Disp: , Rfl: 0 .  cetirizine (ZYRTEC) 10 MG tablet, Take 10 mg by mouth daily., Disp: , Rfl:  .  cimetidine (TAGAMET) 400 MG tablet, Take 1 tablet (400 mg total) by mouth at bedtime., Disp: 30 tablet, Rfl: 11 .  clonazePAM (KLONOPIN) 0.5 MG disintegrating tablet, Take 1 mg by mouth 3 (three) times daily as needed (for anxiety)., Disp: , Rfl:  .  CONDYLOX 0.5 % gel, APPLY TOPICALLY TWICE A DAY (Patient taking differently: APPLY TOPICALLY TWICE A DAY PRN), Disp: 3.5 g, Rfl: 1 .  cyclobenzaprine (FLEXERIL) 5 MG tablet, Take 1 tablet (5 mg total) by mouth 3 (three) times daily as needed for muscle spasms., Disp: 270 tablet, Rfl: 2 .  gabapentin (NEURONTIN) 300 MG capsule, Take 300 mg by mouth 3 (three) times daily as needed (for pain). , Disp: , Rfl: 0 .  lamoTRIgine (LAMICTAL) 100 MG tablet, Take 100 mg by mouth daily., Disp: , Rfl:  .  omeprazole (PRILOSEC) 20 MG capsule, Take 2 capsules by mouth daily. Please disregard last RX sent over signed by Dr Linda Hedges (Patient taking differently: Take 40 mg by mouth daily. ), Disp: 180 capsule, Rfl: 3 .  doxycycline (VIBRA-TABS) 100 MG tablet, Take 1 tablet (100 mg total) by mouth daily., Disp: 30 tablet, Rfl: 0 .  fluconazole (DIFLUCAN) 150 MG tablet, Take 1 tablet (150 mg total) by mouth once for 1 dose., Disp: 1 tablet, Rfl: 0 .  [START ON 04/05/2017] ketoconazole (NIZORAL) 2 % shampoo, Apply 1 application topically 2 (two) times a week.  Use prn, Disp: 120 mL, Rfl: 3 .  ranitidine (ZANTAC) 300 MG tablet, Take 1 tablet (300 mg total) by mouth at bedtime. (Patient not taking: Reported on 02/07/2017), Disp: 30 tablet, Rfl: 5  Current Facility-Administered Medications:  .  methylPREDNISolone acetate (DEPO-MEDROL) injection 80 mg, 80 mg, Intramuscular, Q6 weeks, Plotnikov, Evie Lacks, MD   Review of Systems:   ROS  Negative unless otherwise specified per  HPI.   Vitals:   Vitals:   04/03/17 0910  BP: 126/80  Pulse: (!) 101  Temp: 98.3 F (36.8 C)  TempSrc: Oral  SpO2: 99%  Weight: 143 lb 4 oz (65 kg)  Height: 5\' 8"  (1.727 m)     Body mass index is 21.78 kg/m.  Physical Exam:   Physical Exam  Constitutional: She appears well-developed. She is cooperative.  Non-toxic appearance. She does not have a sickly appearance. She does not appear ill. No distress.  HENT:  Head: Normocephalic and atraumatic.  Nose: Nose normal.  Mouth/Throat: Uvula is midline, oropharynx is clear and moist and mucous membranes are normal.  Cardiovascular: Normal rate, regular rhythm, S1 normal, S2 normal, normal heart sounds and normal pulses.  No LE edema  Pulmonary/Chest: Effort normal and breath sounds normal.  Neurological: She is alert. GCS eye subscore is 4. GCS verbal subscore is 5. GCS motor subscore is 6.  Skin: Skin is warm, dry and intact.  Multiple scattered erythematous papules along face, neck, torso, upper back and bilateral upper extremities; no evidence of discharge or infection  Psychiatric: She has a normal mood and affect. Her speech is normal and behavior is normal.  Nursing note and vitals reviewed.   Assessment and Plan:    Makylah was seen today for acne.  Diagnoses and all orders for this visit:  Generalized skin papules Will get baseline labs to assess for other possible cause. She has ketoconazole shampoo in her medication list, I recommended that she try to use this on the area to cover for any fungal  etiology. We discussed possibly using daily doxycycline to help with skin, however she is going to consider this -- I counseled on photosensitivity and GERD with this medication. She will decide if she is going to take it after she tries the ketoconazole. I also recommended that she get set up with dermatology for further evaluation and treatment -- a list was provided to patient today. Patient verbalized understanding. -     Comprehensive metabolic panel -     CBC with Differential/Platelet -     TSH -     T4, free  Family history of diabetes mellitus -     Hemoglobin A1c  Other orders -     doxycycline (VIBRA-TABS) 100 MG tablet; Take 1 tablet (100 mg total) by mouth daily. -     ketoconazole (NIZORAL) 2 % shampoo; Apply 1 application topically 2 (two) times a week. Use prn -     fluconazole (DIFLUCAN) 150 MG tablet; Take 1 tablet (150 mg total) by mouth once for 1 dose.    . Reviewed expectations re: course of current medical issues. . Discussed self-management of symptoms. . Outlined signs and symptoms indicating need for more acute intervention. . Patient verbalized understanding and all questions were answered. . See orders for this visit as documented in the electronic medical record. . Patient received an After-Visit Summary.  CMA or LPN served as scribe during this visit. History, Physical, and Plan performed by medical provider. Documentation and orders reviewed and attested to.  Inda Coke, PA-C

## 2017-06-22 ENCOUNTER — Ambulatory Visit: Payer: Self-pay | Admitting: Physician Assistant

## 2017-06-23 ENCOUNTER — Encounter: Payer: Self-pay | Admitting: Family Medicine

## 2017-06-23 ENCOUNTER — Ambulatory Visit (INDEPENDENT_AMBULATORY_CARE_PROVIDER_SITE_OTHER): Payer: 59 | Admitting: Family Medicine

## 2017-06-23 VITALS — BP 106/70 | HR 98 | Temp 98.7°F | Wt 146.0 lb

## 2017-06-23 DIAGNOSIS — J029 Acute pharyngitis, unspecified: Secondary | ICD-10-CM | POA: Diagnosis not present

## 2017-06-23 DIAGNOSIS — H6982 Other specified disorders of Eustachian tube, left ear: Secondary | ICD-10-CM | POA: Diagnosis not present

## 2017-06-23 LAB — POCT RAPID STREP A (OFFICE): RAPID STREP A SCREEN: NEGATIVE

## 2017-06-23 MED ORDER — METHYLPREDNISOLONE ACETATE 40 MG/ML IJ SUSP
40.0000 mg | Freq: Once | INTRAMUSCULAR | Status: AC
  Administered 2017-06-23: 40 mg via INTRAMUSCULAR

## 2017-06-23 NOTE — Patient Instructions (Addendum)
Ibuprofen 400-600 mg (2-3 over the counter strength tabs) every 6 hours as needed for pain.   Your strep test is negative.  Start a steroid nasal spray like Flonase to help with the ear.  Continue to push fluids, practice good hand hygiene, and cover your mouth if you cough.  If you start having fevers, shaking or shortness of breath, seek immediate care.  Let us know if you need anything.

## 2017-06-23 NOTE — Progress Notes (Signed)
Chief Complaint  Patient presents with  . Sore Throat    X 4 days    Jadene Pierini here for URI complaints.  Duration: 4 days  Associated symptoms: subjective fever, R ear pain and sore throat Denies: sinus congestion, sinus pain, rhinorrhea, itchy watery eyes, ear drainage, wheezing, shortness of breath, myalgia and cough Treatment to date: Tylenol Sick contacts: Yes - daughter  ROS:  Const: +subj fevers HEENT: As noted in HPI Lungs: No SOB  Past Medical History:  Diagnosis Date  . Acid reflux   . Allergy   . Anemia   . Anxiety   . B12 deficiency   . Bipolar 1 disorder ([place])   . Blood type, Rh negative   . Cervical dysplasia   . Depression   . Essential tremor   . Fibromyalgia   . Hives   . IBS (irritable bowel syndrome)   . Neuromuscular disorder ([place])    fibromylagia  . Spastic colon      BP 106/70   Pulse 98   Temp 98.7 F (37.1 C) (Oral)   Wt 146 lb (66.2 kg)   SpO2 98%   BMI 22.20 kg/m  General: Awake, alert, appears stated age HEENT: AT, [place], ears patent b/l and TM's neg, nares patent w/o discharge, pharynx erythematous and without exudates, MMM Neck: No masses or asymmetry Heart: RRR Lungs: CTAB, no accessory muscle use Psych: Age appropriate judgment and insight, normal mood and affect  Pharyngitis, unspecified etiology - Plan: methylPREDNISolone acetate (DEPO-MEDROL) injection 40 mg  Dysfunction of left eustachian tube - Plan: methylPREDNISolone acetate (DEPO-MEDROL) injection 40 mg  Orders as above. Strep neg. Chronic hives, is due for above injection, could help with  Continue to push fluids, practice good hand hygiene, cover mouth when coughing. F/u prn. If starting to experience fevers, shaking, or shortness of breath, seek immediate care. Pt voiced understanding and agreement to the plan.  [place], DO 06/23/17 10:36 AM

## 2017-06-23 NOTE — Addendum Note (Signed)
Addended by: Durwin Glaze on: 06/23/2017 10:52 AM   Modules accepted: Orders

## 2017-07-24 ENCOUNTER — Encounter: Payer: Self-pay | Admitting: Internal Medicine

## 2017-08-06 ENCOUNTER — Encounter: Payer: Self-pay | Admitting: Gynecology

## 2017-08-06 NOTE — Telephone Encounter (Signed)
Be happy to talk about it to go over indications/risks.  Not sure why dermatologist did not start her on it if she felt it was indicated?

## 2017-09-24 ENCOUNTER — Encounter: Payer: Self-pay | Admitting: Internal Medicine

## 2017-09-24 ENCOUNTER — Ambulatory Visit (INDEPENDENT_AMBULATORY_CARE_PROVIDER_SITE_OTHER): Payer: 59 | Admitting: Internal Medicine

## 2017-09-24 DIAGNOSIS — M797 Fibromyalgia: Secondary | ICD-10-CM

## 2017-09-24 DIAGNOSIS — M5440 Lumbago with sciatica, unspecified side: Secondary | ICD-10-CM | POA: Diagnosis not present

## 2017-09-24 DIAGNOSIS — I10 Essential (primary) hypertension: Secondary | ICD-10-CM | POA: Diagnosis not present

## 2017-09-24 DIAGNOSIS — R2232 Localized swelling, mass and lump, left upper limb: Secondary | ICD-10-CM

## 2017-09-24 DIAGNOSIS — G8929 Other chronic pain: Secondary | ICD-10-CM

## 2017-09-24 MED ORDER — HYDROCODONE-ACETAMINOPHEN 5-325 MG PO TABS: 1.0000 | ORAL_TABLET | Freq: Four times a day (QID) | ORAL | 0 refills | Status: AC | PRN

## 2017-09-24 NOTE — Assessment & Plan Note (Signed)
Worse Norco filled - rare use

## 2017-09-24 NOTE — Progress Notes (Signed)
Subjective:  Patient ID: Christine Duncan, female    DOB: 1980/09/29  Age: 37 y.o. MRN: [id number]  CC: No chief complaint on file.   HPI Christine Duncan presents for L wrist growth F/u LBP, FMS  Outpatient Medications Prior to Visit  Medication Sig Dispense Refill  . amphetamine-dextroamphetamine (ADDERALL) 20 MG tablet Take 20 mg by mouth daily as needed (to focus).   0  . cetirizine (ZYRTEC) 10 MG tablet Take 10 mg by mouth daily.    . cimetidine (TAGAMET) 400 MG tablet Take 1 tablet (400 mg total) by mouth at bedtime. 30 tablet 11  . clonazePAM (KLONOPIN) 0.5 MG disintegrating tablet Take 1 mg by mouth 3 (three) times daily as needed (for anxiety).    . CONDYLOX 0.5 % gel APPLY TOPICALLY TWICE A DAY 3.5 g 1  . cyclobenzaprine (FLEXERIL) 5 MG tablet Take 1 tablet (5 mg total) by mouth 3 (three) times daily as needed for muscle spasms. 270 tablet 2  . ketoconazole (NIZORAL) 2 % shampoo Apply 1 application topically 2 (two) times a week. Use prn 120 mL 3  . lamoTRIgine (LAMICTAL) 100 MG tablet Take 100 mg by mouth daily.    Marland Kitchen omeprazole (PRILOSEC) 20 MG capsule Take 2 capsules by mouth daily. Please disregard last RX sent over signed by Dr Debby Bud (Patient taking differently: Take 40 mg by mouth daily. ) 180 capsule 3  . ranitidine (ZANTAC) 300 MG tablet Take 1 tablet (300 mg total) by mouth at bedtime. 30 tablet 5  . spironolactone (ALDACTONE) 25 MG tablet Take 25 mg by mouth 2 (two) times daily.  1  . gabapentin (NEURONTIN) 300 MG capsule Take 300 mg by mouth 3 (three) times daily as needed (for pain).   0   Facility-Administered Medications Prior to Visit  Medication Dose Route Frequency Provider Last Rate Last Dose  . methylPREDNISolone acetate (DEPO-MEDROL) injection 80 mg  80 mg Intramuscular Q6 weeks Auriah Hollings, Georgina Quint, MD        ROS: Review of Systems  Constitutional: Negative for activity change, appetite change, chills, fatigue and unexpected weight change.  HENT:  Negative for congestion, mouth sores and sinus pressure.   Eyes: Negative for visual disturbance.  Respiratory: Negative for cough and chest tightness.   Gastrointestinal: Negative for abdominal pain and nausea.  Genitourinary: Negative for difficulty urinating, frequency and vaginal pain.  Musculoskeletal: Positive for arthralgias and back pain. Negative for gait problem.  Skin: Negative for pallor and rash.  Neurological: Negative for dizziness, tremors, weakness, numbness and headaches.  Psychiatric/Behavioral: Negative for confusion and sleep disturbance.    Objective:  BP 112/80 (BP Location: Right Arm, Patient Position: Sitting, Cuff Size: Normal)   Pulse (!) 101   Temp 98.6 F (37 C) (Oral)   Ht 5\' 8"  (1.727 m)   Wt 150 lb (68 kg)   SpO2 99%   BMI 22.81 kg/m   BP Readings from Last 3 Encounters:  09/24/17 112/80  06/23/17 106/70  04/03/17 126/80    Wt Readings from Last 3 Encounters:  09/24/17 150 lb (68 kg)  06/23/17 146 lb (66.2 kg)  04/03/17 143 lb 4 oz (65 kg)    Physical Exam  Constitutional: She appears well-developed. No distress.  HENT:  Head: Normocephalic.  Right Ear: External ear normal.  Left Ear: External ear normal.  Nose: Nose normal.  Mouth/Throat: Oropharynx is clear and moist.  Eyes: Pupils are equal, round, and reactive to light. Conjunctivae are normal. Right eye  exhibits no discharge. Left eye exhibits no discharge.  Neck: Normal range of motion. Neck supple. No JVD present. No tracheal deviation present. No thyromegaly present.  Cardiovascular: Normal rate, regular rhythm and normal heart sounds.  Pulmonary/Chest: No stridor. No respiratory distress. She has no wheezes.  Abdominal: Soft. Bowel sounds are normal. She exhibits no distension and no mass. There is no tenderness. There is no rebound and no guarding.  Musculoskeletal: She exhibits tenderness. She exhibits no edema.  Lymphadenopathy:    She has no cervical adenopathy.    Neurological: She displays normal reflexes. No cranial nerve deficit. She exhibits normal muscle tone. Coordination normal.  Skin: No rash noted. No erythema.  Psychiatric: She has a normal mood and affect. Her behavior is normal. Judgment and thought content normal.     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  Assessment & Plan:   There are no diagnoses linked to this encounter.   No orders of the defined types were placed in this encounter.    Follow-up: No follow-ups on file.  Sonda Primes, MD

## 2017-09-24 NOTE — Assessment & Plan Note (Signed)
Irreg shaped ?ganglion cyst with a blood vessel on [place] Ortho ref

## 2017-09-24 NOTE — Assessment & Plan Note (Signed)
Off Gabaentin

## 2017-09-24 NOTE — Assessment & Plan Note (Signed)
BP Readings from Last 3 Encounters:  09/24/17 112/80  06/23/17 106/70  04/03/17 126/80

## 2017-10-08 ENCOUNTER — Encounter: Payer: Self-pay | Admitting: Physician Assistant

## 2017-10-08 ENCOUNTER — Ambulatory Visit (INDEPENDENT_AMBULATORY_CARE_PROVIDER_SITE_OTHER): Payer: 59 | Admitting: Physician Assistant

## 2017-10-08 ENCOUNTER — Ambulatory Visit (INDEPENDENT_AMBULATORY_CARE_PROVIDER_SITE_OTHER): Payer: 59

## 2017-10-08 DIAGNOSIS — M79662 Pain in left lower leg: Secondary | ICD-10-CM

## 2017-10-08 DIAGNOSIS — M79661 Pain in right lower leg: Secondary | ICD-10-CM

## 2017-10-08 DIAGNOSIS — M542 Cervicalgia: Secondary | ICD-10-CM

## 2017-10-08 DIAGNOSIS — S199XXA Unspecified injury of neck, initial encounter: Secondary | ICD-10-CM

## 2017-10-08 DIAGNOSIS — R0789 Other chest pain: Secondary | ICD-10-CM

## 2017-10-08 MED ORDER — CYCLOBENZAPRINE HCL 5 MG PO TABS: 5.0000 mg | ORAL_TABLET | Freq: Three times a day (TID) | ORAL | 0 refills | Status: DC | PRN

## 2017-10-08 NOTE — Patient Instructions (Signed)
It was great to see you!  Please continue ibuprofen. May take flexeril.  If you develop any sudden onset shortness of breath or chest pain, please go to the ER. If you develop any worsening leg pain please seek medical attention.  Take care,  Inda Coke PA-C
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
## 2017-10-09 ENCOUNTER — Encounter: Payer: Self-pay | Admitting: Physician Assistant

## 2017-10-31 ENCOUNTER — Ambulatory Visit: Payer: Self-pay

## 2017-10-31 ENCOUNTER — Ambulatory Visit: Payer: Self-pay | Admitting: Sports Medicine

## 2017-10-31 ENCOUNTER — Encounter: Payer: Self-pay | Admitting: Sports Medicine

## 2017-10-31 ENCOUNTER — Encounter: Payer: Self-pay | Admitting: Physician Assistant

## 2017-10-31 VITALS — BP 110/78 | HR 85 | Ht 68.0 in | Wt 149.8 lb

## 2017-10-31 DIAGNOSIS — M79662 Pain in left lower leg: Secondary | ICD-10-CM

## 2017-10-31 DIAGNOSIS — S8012XA Contusion of left lower leg, initial encounter: Secondary | ICD-10-CM
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
## 2017-10-31 NOTE — Procedures (Signed)
LIMITED MSK ULTRASOUND OF Left leg Images were obtained and interpreted by myself, Teresa Coombs, DO  Images have been saved and stored to PACS system. Images obtained on: GE S7 Ultrasound machine  FINDINGS:   Left medial leg has disruption of the subcutaneous tissue with hypoechoic change in a linear fashion traces down to the lesser saphenous vein.  There is subcutaneous edema as well as resolving contusion.  No significant disruption of the bone or muscular layer.  Lesser saphenous vein does have normal flow  IMPRESSION:  1. Left medial calf contusion

## 2018-10-08 ENCOUNTER — Encounter: Payer: Self-pay | Admitting: Gynecology

## 2019-01-31 IMAGING — CT CT ABD-PELV W/ CM
2 of 4 series · 16 of 46 positions shown, 18 images · IV contrast (ISOVUE)
Comparison: None.

CLINICAL DATA: Acute onset of generalized abdominal pain, radiating
to the back. Abdominal distention. Nausea. Diaphoresis. Initial
encounter.

EXAM:
CT ABDOMEN AND PELVIS WITH CONTRAST
TECHNIQUE: Multidetector CT imaging of the abdomen and pelvis was performed
using the standard protocol following bolus administration of
intravenous contrast.
CONTRAST:  100mL [id number] IOPAMIDOL ([id number]) INJECTION 61%

[Series 2: abd/pel with · axial · 0.74mm/px · z∈[-484,-99]mm · 13 of 89 slices shown, 15 images]
[im 6/89  soft-tissue]
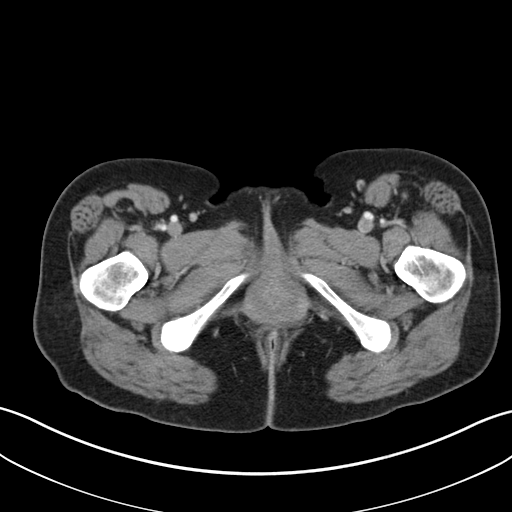
[im 6/89  bone]
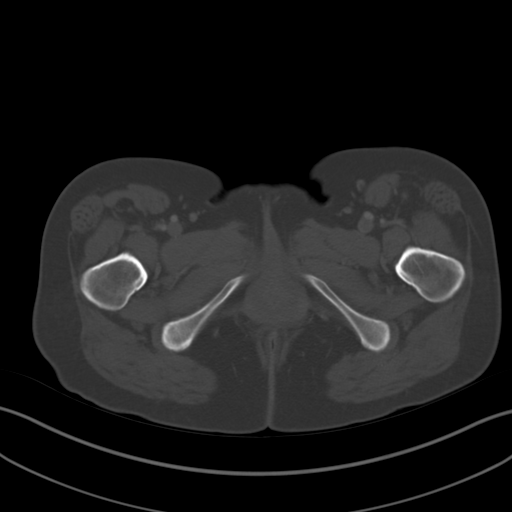
[im 11/89  soft-tissue]
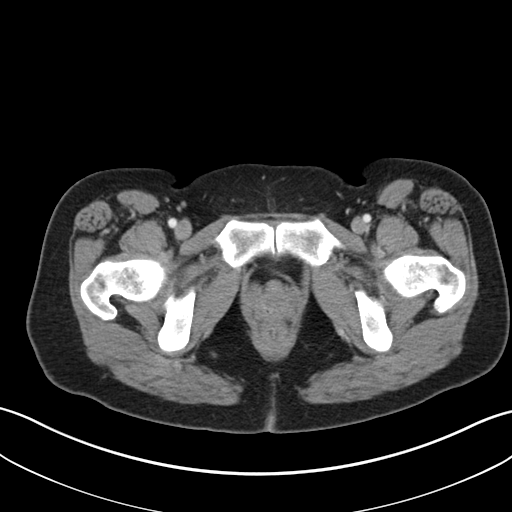
[im 21/89  soft-tissue]
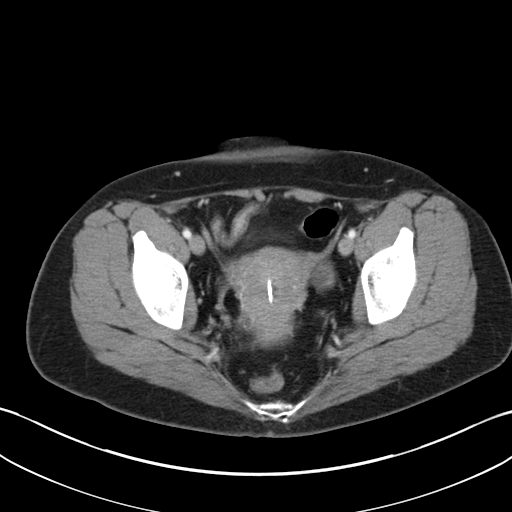
[im 26/89  soft-tissue]
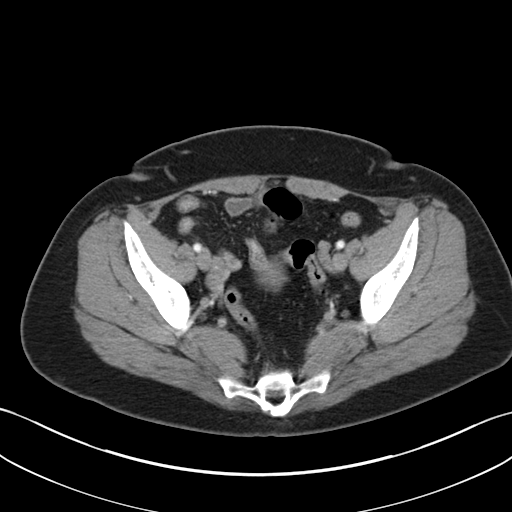
[im 32/89  soft-tissue]
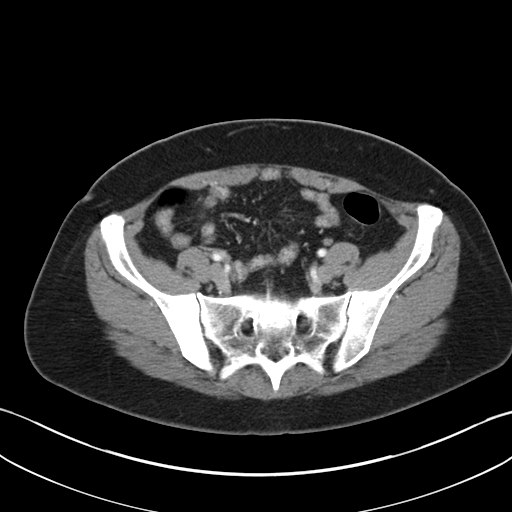
[im 37/89  soft-tissue]
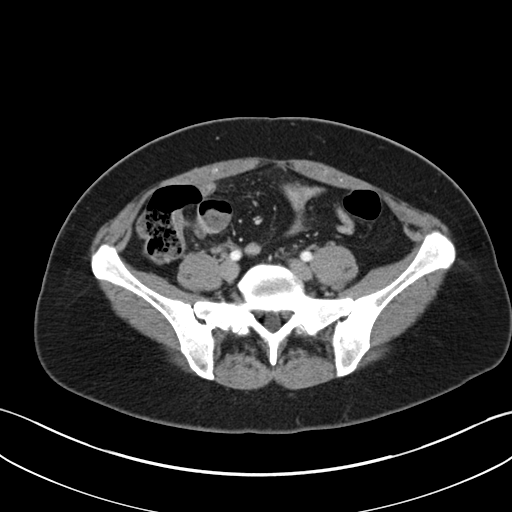
[im 47/89  soft-tissue]
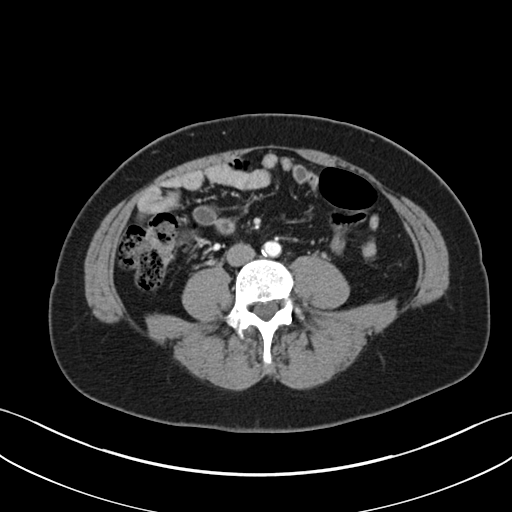
[im 52/89  soft-tissue]
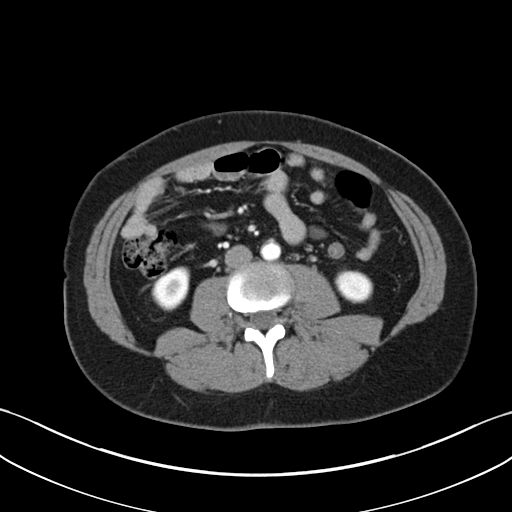
[im 57/89  soft-tissue]
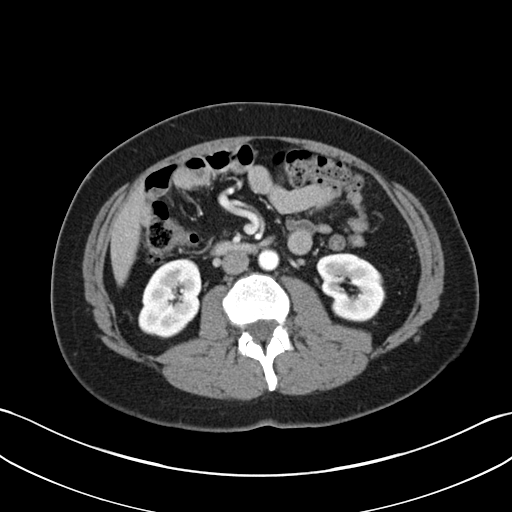
[im 57/89  bone]
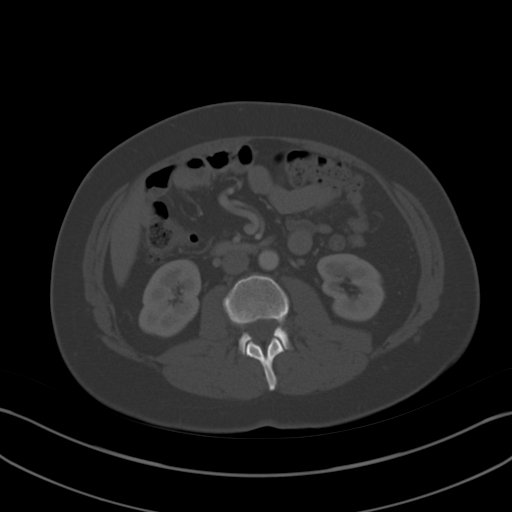
[im 63/89  soft-tissue]
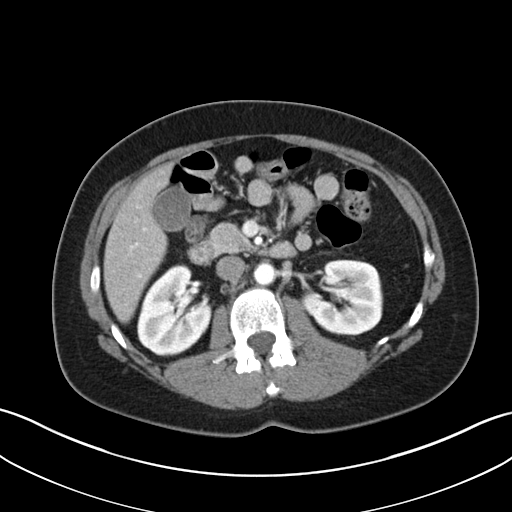
[im 68/89  soft-tissue]
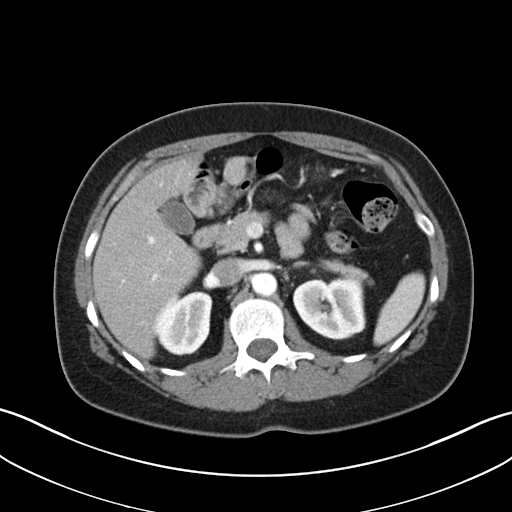
[im 78/89  soft-tissue]
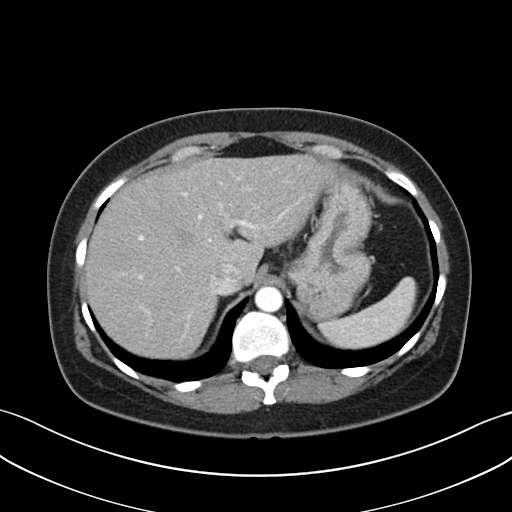
[im 83/89  soft-tissue]
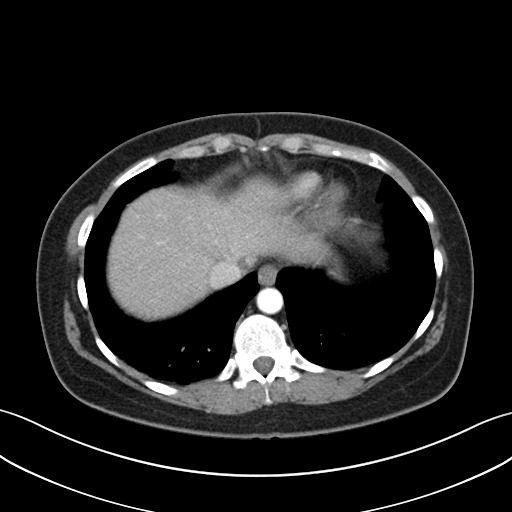

[Series 5: coronal a/|p · coronal · 0.75mm/px · 3 of 118 slices shown]
[im 40/118  soft-tissue]
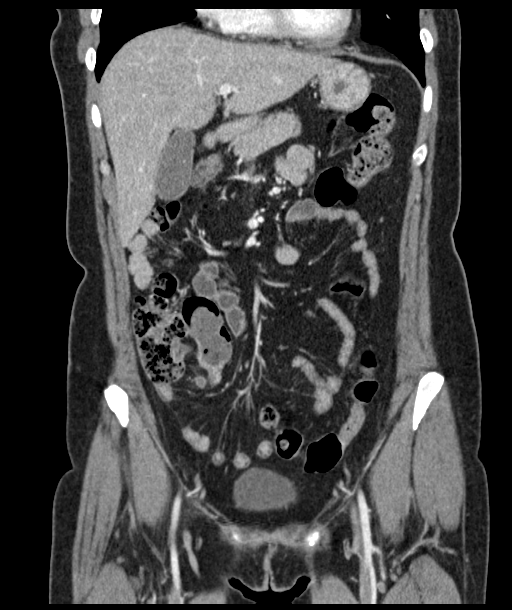
[im 53/118  soft-tissue]
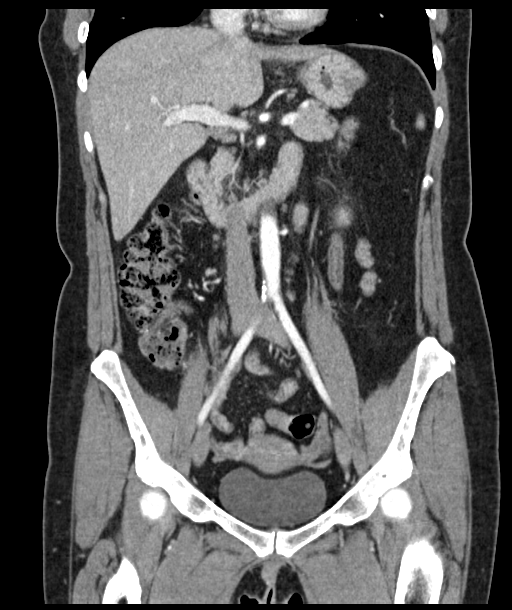
[im 66/118  soft-tissue]
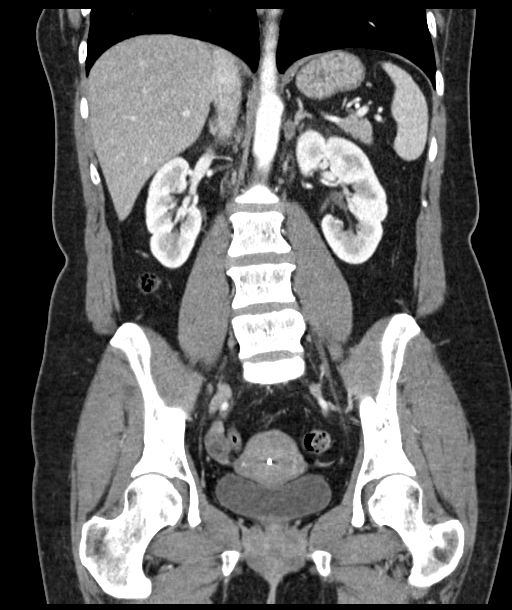

[16 of 46 positions shown; findings below may reference images not displayed]

FINDINGS: Lower chest: Minimal right basilar atelectasis is noted. The
visualized portions of the mediastinum are unremarkable.

Hepatobiliary: The liver is unremarkable in appearance. The
gallbladder is unremarkable in appearance. The common bile duct
remains normal in caliber.

Pancreas: The pancreas is within normal limits.

Spleen: The spleen is unremarkable in appearance.

Adrenals/Urinary Tract: The adrenal glands are unremarkable in
appearance. The kidneys are within normal limits. There is no
evidence of hydronephrosis. No renal or ureteral stones are
identified. No perinephric stranding is seen.

Stomach/Bowel: The appendix is distended to 1.0 cm near its distal
tip, with mild soft tissue inflammation and mild wall thickening,
concerning for mild acute appendicitis. There is no evidence of
perforation or abscess formation at this time.

The colon is grossly unremarkable in appearance. The small bowel is
grossly unremarkable. The stomach is within normal limits.

Vascular/Lymphatic: Minimal calcification is noted at the aortic
bifurcation. The abdominal aorta is otherwise unremarkable. The
inferior vena cava is grossly unremarkable. No retroperitoneal
lymphadenopathy is seen. No pelvic sidewall lymphadenopathy is
identified.

Reproductive: The bladder is mildly distended and grossly
unremarkable. The uterus is unremarkable in appearance. An
intrauterine device is noted in expected position at the fundus of
the uterus.

Other: No additional soft tissue abnormalities are seen.

Musculoskeletal: No acute osseous abnormalities are identified. The
visualized musculature is unremarkable in appearance.
IMPRESSION: Mild acute appendicitis, with distention of the appendix to 1.0 cm
near its distal tip, surrounding soft tissue inflammation and mild
wall thickening. No evidence of perforation or abscess formation at
this time.

These results were called by telephone at the time of interpretation
on 08/19/2016 at [DATE] to Dr. AKHILA SALAS, who verbally
acknowledged these results.

## 2020-10-15 ENCOUNTER — Emergency Department
Admission: EM | Admit: 2020-10-15 | Discharge: 2020-10-15 | Disposition: A | Discharge status: Home / Self Care | Payer: BC Managed Care – PPO | Source: Home / Self Care | Attending: Family Medicine | Admitting: Family Medicine

## 2020-10-15 ENCOUNTER — Other Ambulatory Visit: Payer: Self-pay

## 2020-10-15 ENCOUNTER — Encounter: Payer: Self-pay | Admitting: Emergency Medicine

## 2020-10-15 ENCOUNTER — Emergency Department: Admit: 2020-10-15 | Discharge status: Absent without leave | Payer: Self-pay

## 2020-10-15 ENCOUNTER — Telehealth: Payer: Self-pay | Admitting: Internal Medicine

## 2020-10-15 DIAGNOSIS — R21 Rash and other nonspecific skin eruption: Secondary | ICD-10-CM | POA: Diagnosis not present

## 2020-10-15 MED ORDER — DOXYCYCLINE HYCLATE 100 MG PO CAPS: ORAL_CAPSULE | ORAL | 0 refills | Status: DC

## 2020-10-15 MED ORDER — METHYLPREDNISOLONE ACETATE 80 MG/ML IJ SUSP
60.0000 mg | Freq: Once | INTRAMUSCULAR | Status: AC
  Administered 2020-10-15: 60 mg via INTRAMUSCULAR

## 2020-10-15 MED ORDER — FLUCONAZOLE 150 MG PO TABS: 150.0000 mg | ORAL_TABLET | Freq: Once | ORAL | 0 refills | Status: AC

## 2020-10-15 NOTE — ED Provider Notes (Signed)
[facility]    CSN: [id number] Arrival date & time: 10/15/20  1028      History   Chief Complaint Chief Complaint  Patient presents with   Insect Bite    HPI Christine Duncan is a 40 y.o. female.   Patient complains of a persistent pruritic rash on primarily her upper arms for about 2 to 3 weeks.  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  She does not believe that her rash has been caused by bedbugs.  She feels well otherwise.  The history is provided by the patient.  Rash Location: upper medial arms. Quality: dryness, itchiness and redness   Quality: not blistering, not bruising, not draining, not painful, not peeling, not swelling and not weeping   Severity:  Mild Onset quality:  Sudden Duration:  3 weeks Timing:  Constant Progression:  Unchanged Chronicity:  New Context: not animal contact, not chemical exposure, not exposure to similar rash, not food, not hot tub use, not insect bite/sting, not medications, not new detergent/soap, not nuts, not plant contact and not sick contacts   Relieved by:  Nothing Worsened by:  Nothing Ineffective treatments:  Antihistamines Associated symptoms: no abdominal pain, no diarrhea, no fatigue, no fever, no headaches, no induration, no joint pain, no myalgias, no nausea and no sore throat    Past Medical History:  Diagnosis Date   Acid reflux    Allergy    Anemia    Anxiety    B12 deficiency    Bipolar 1 disorder (HCC)    Blood type, Rh negative    Cervical dysplasia    Depression    Essential tremor    Fibromyalgia    Hives    IBS (irritable bowel syndrome)    Neuromuscular disorder (HCC)    fibromylagia   Spastic colon     Patient Active Problem List   Diagnosis Date Noted   Contusion of left leg 10/31/2017   Mass of left wrist 09/24/2017   Dandruff in adult 11/07/2016   Benign mole 09/15/2016   Throat swelling 08/24/2016   Thrush 08/24/2016   Smoker 08/24/2016    Acute appendicitis 08/19/2016   Well adult exam 08/01/2016   Acute tonsillitis 06/28/2016   Hypokalemia 07/29/2014   Sialoadenitis 07/29/2014   Warts 07/29/2014   Shingles outbreak 12/10/2013   Hoarseness 12/04/2013   Knee MCL sprain 09/02/2013   Acute meniscal tear of left knee 09/02/2013   Pes anserine bursitis 09/02/2013   Pes planus 09/02/2013   Bipolar I disorder, most recent episode (or current) manic ([place]) 11/27/2012   Dyslipidemia 11/07/2012   Fibromyalgia muscle pain 09/13/2011   Low back pain 09/13/2011   Spastic colon    Essential tremor    Rosacea 08/02/2010   EYE FLOATERS 03/29/2010   Essential hypertension 01/25/2010   SWEATING 01/25/2010   Tobacco use 10/21/2008   FATIGUE 10/21/2008   URTICARIA 06/06/2008   ESOPHAGITIS 05/08/2008   GASTRITIS, CHRONIC 05/08/2008   COLITIS 05/08/2008   IBS 05/08/2008   DYSTONIA 04/20/2008   GERD 04/13/2008   B12 deficiency 01/20/2008   INSOMNIA, PERSISTENT 01/20/2008   Myoclonus 01/20/2008   DSORD BIPOLAR I, UNSPC, MOST RECENT EPSD 08/25/2006   SYNCOPE 08/25/2006    Past Surgical History:  Procedure Laterality Date   CERVICAL BIOPSY  W/ LOOP ELECTRODE EXCISION     CESAREAN SECTION  2006   CESAREAN SECTION  2011   COLPOSCOPY     DILATION  AND CURETTAGE OF UTERUS  2005   INTRAUTERINE DEVICE INSERTION  05/09/2016   LAPAROSCOPIC APPENDECTOMY N/A 08/19/2016   Procedure: APPENDECTOMY LAPAROSCOPIC;  Surgeon: Alphonsa Overall, MD;  Location: [facility];  Service: General;  Laterality: N/A;   LEEP  07/2010   CIN II, Margins free    OB History     Gravida  5   Para  2   Term  2   Preterm      AB  3   Living  2      SAB  3   IAB      Ectopic      Multiple      Live Births               Home Medications    Prior to Admission medications   Medication Sig Start Date End Date Taking? Authorizing Provider  doxycycline (VIBRAMYCIN) 100 MG capsule Take one cap PO Q12hr with food. 10/15/20  Yes Kandra Nicolas,  MD  fluconazole (DIFLUCAN) 150 MG tablet Take 1 tablet (150 mg total) by mouth once for 1 dose. May repeat in 72 hours. 10/15/20 10/15/20 Yes Kandra Nicolas, MD  amphetamine-dextroamphetamine (ADDERALL) 20 MG tablet Take 20 mg by mouth daily as needed (to focus).     [provider]  cetirizine (ZYRTEC) 10 MG tablet Take 10 mg by mouth daily.    [provider]  clonazePAM (KLONOPIN) 0.5 MG disintegrating tablet Take 1 mg by mouth 3 (three) times daily as needed (for anxiety).    [provider]  cyclobenzaprine (FLEXERIL) 5 MG tablet Take 1 tablet (5 mg total) by mouth 3 (three) times daily as needed for muscle spasms. 10/08/17   Inda Coke, PA  lamoTRIgine (LAMICTAL) 100 MG tablet Take 100 mg by mouth daily.    [provider]  omeprazole (PRILOSEC) 20 MG capsule Take 2 capsules by mouth daily. Please disregard last RX sent over signed by Dr Linda Hedges Patient taking differently: Take 40 mg by mouth daily.  02/27/13   Plotnikov, Evie Lacks, MD  temazepam (RESTORIL) 15 MG capsule Take 15 mg by mouth at bedtime.    [provider]    Family History Family History  Problem Relation Age of Onset   Thyroid disease Mother    Depression Mother    Cancer Father 23       esoph ca   Esophageal cancer Father    Hyperlipidemia Paternal Grandmother    Diabetes Paternal Grandmother    Breast cancer Paternal Grandmother 79   Cancer Paternal Grandmother        Bladder and colon cancer   Colon cancer Paternal Grandmother    Bipolar disorder Paternal Grandmother    Stroke Paternal Grandmother    Seizures Maternal Grandmother    Thyroid disease Maternal Grandmother    Depression Maternal Grandmother    Diabetes Maternal Grandmother    Bipolar disorder Maternal Grandfather    Alcohol abuse Maternal Grandfather    Drug abuse Maternal Grandfather    Allergic rhinitis Brother    Asthma Paternal Aunt    Allergic rhinitis Paternal Aunt    Diabetes Paternal  Grandfather    Rectal cancer Neg Hx    Stomach cancer Neg Hx     Social History Social History   Tobacco Use   Smoking status: Former    Packs/day: 1.00    Types: E-cigarettes, Cigarettes   Smokeless tobacco: Never   Tobacco comments:    Pt Vapes  everyday.  Vaping Use   Vaping Use: Every day   Substances: Nicotine, Flavoring  Substance Use Topics   Alcohol use: Yes    Alcohol/week: 0.0 standard drinks    Comment: Rare   Drug use: No     Allergies   Carbidopa-levodopa and Sertraline hcl   Review of Systems Review of Systems  Constitutional:  Negative for activity change, appetite change, chills, diaphoresis, fatigue and fever.  HENT:  Negative for sore throat.   Gastrointestinal:  Negative for abdominal pain, diarrhea and nausea.  Musculoskeletal:  Negative for arthralgias and myalgias.  Skin:  Positive for rash.  Neurological:  Negative for headaches.  All other systems reviewed and are negative.   Physical Exam Triage Vital Signs ED Triage Vitals  Enc Vitals Group     BP 10/15/20 1049 140/87     Pulse Rate 10/15/20 1049 80     Resp 10/15/20 1049 18     Temp 10/15/20 1049 98.4 F (36.9 C)     Temp Source 10/15/20 1049 Oral     SpO2 10/15/20 1049 99 %     Weight 10/15/20 1050 160 lb (72.6 kg)     Height 10/15/20 1050 5\' 8"  (1.727 m)     Head Circumference --      Peak Flow --      Pain Score 10/15/20 1049 3     Pain Loc --      Pain Edu? --      Excl. in [place]? --    No data found.  Updated Vital Signs BP 140/87 (BP Location: Left Arm)   Pulse 80   Temp 98.4 F (36.9 C) (Oral)   Resp 18   Ht 5\' 8"  (1.727 m)   Wt 72.6 kg   SpO2 99%   BMI 24.33 kg/m   Visual Acuity Right Eye Distance:   Left Eye Distance:   Bilateral Distance:    Right Eye Near:   Left Eye Near:    Bilateral Near:     Physical Exam Vitals and nursing note reviewed.  Constitutional:      General: She is not in acute distress. HENT:     Right Ear: External ear normal.      Left Ear: External ear normal.     Nose: Nose normal.     Mouth/Throat:     Pharynx: Oropharynx is clear.  Eyes:     Pupils: Pupils are equal, round, and reactive to light.  Cardiovascular:     Rate and Rhythm: Normal rate.  Pulmonary:     Effort: Pulmonary effort is normal.  Musculoskeletal:     Cervical back: Neck supple.  Lymphadenopathy:     Cervical: No cervical adenopathy.  Skin:    General: Skin is warm and dry.     Findings: Rash present.          Comments: Upper arms have scattered erythematous macules, some excoriated with increased surrounding erythema.  No fluctuance or drainage.  No tenderness to palpation.  Neurological:     Mental Status: She is alert.     UC Treatments / Results  Labs (all labs ordered are listed, but only abnormal results are displayed) Labs Reviewed - No data to display  EKG   Radiology No results found.  Procedures Procedures (including critical care time)  Medications Ordered in UC Medications  methylPREDNISolone acetate (DEPO-MEDROL) injection 60 mg (has no administration in time range)    Initial Impression / Assessment and Plan / UC  Course  I have reviewed the triage vital signs and the nursing notes.  Pertinent labs & imaging results that were available during my care of the patient were reviewed by me and considered in my medical decision making (see chart for details).    ?folliculitis.  ?infected insect bites. Administered Depo Medrol 60mg  IM.  Begin empiric doxycycline.  Rx for Diflucan at patient's request. Followup with dermatologist if not resolved 2 weeks.   Final Clinical Impressions(s) / UC Diagnoses   Final diagnoses:  Rash and nonspecific skin eruption     Discharge Instructions      May take Benadryl at bedtime if needed for itching.  May apply 1% hydrocortisone cream 2 or 3 times daily.   ED Prescriptions     Medication Sig Dispense Auth. Provider   doxycycline (VIBRAMYCIN) 100 MG  capsule Take one cap PO Q12hr with food. 14 capsule Kandra Nicolas, MD   fluconazole (DIFLUCAN) 150 MG tablet Take 1 tablet (150 mg total) by mouth once for 1 dose. May repeat in 72 hours. 2 tablet Kandra Nicolas, MD         Kandra Nicolas, MD 10/16/20 [phone]

## 2020-10-15 NOTE — ED Triage Notes (Signed)
Insect bites/Rash on arms and a couple in underwear line. 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

## 2020-10-15 NOTE — Discharge Instructions (Addendum)
May take Benadryl at bedtime if needed for itching.  May apply 1% hydrocortisone cream 2 or 3 times daily.

## 2020-10-25 ENCOUNTER — Ambulatory Visit: Payer: BC Managed Care – PPO | Admitting: Internal Medicine

## 2020-10-25 ENCOUNTER — Encounter: Payer: Self-pay | Admitting: Internal Medicine

## 2020-10-25 ENCOUNTER — Other Ambulatory Visit: Payer: Self-pay

## 2020-10-25 VITALS — BP 128/84 | HR 90 | Temp 99.0°F | Ht 68.0 in | Wt 158.0 lb

## 2020-10-25 DIAGNOSIS — R21 Rash and other nonspecific skin eruption: Secondary | ICD-10-CM | POA: Diagnosis not present

## 2020-10-25 DIAGNOSIS — R350 Frequency of micturition: Secondary | ICD-10-CM | POA: Diagnosis not present

## 2020-10-25 DIAGNOSIS — I10 Essential (primary) hypertension: Secondary | ICD-10-CM

## 2020-10-25 LAB — C-REACTIVE PROTEIN: CRP: <1 mg/dL (ref 0.5–20.0)

## 2020-10-25 LAB — CBC WITH DIFFERENTIAL/PLATELET
Basophils Absolute: 0.1 10*3/uL (ref 0.0–0.1)
Basophils Relative: 1 % (ref 0.0–3.0)
Eosinophils Absolute: 0 10*3/uL (ref 0.0–0.7)
Eosinophils Relative: 0.4 % (ref 0.0–5.0)
HCT: 40.8 % (ref 36.0–46.0)
Hemoglobin: 13.8 g/dL (ref 12.0–15.0)
Lymphocytes Relative: 32.9 % (ref 12.0–46.0)
Lymphs Abs: 2.3 10*3/uL (ref 0.7–4.0)
MCHC: 33.7 g/dL (ref 30.0–36.0)
MCV: 84.3 fl (ref 78.0–100.0)
Monocytes Absolute: 0.5 10*3/uL (ref 0.1–1.0)
Monocytes Relative: 6.5 % (ref 3.0–12.0)
Neutro Abs: 4.2 10*3/uL (ref 1.4–7.7)
Neutrophils Relative %: 59.2 % (ref 43.0–77.0)
Platelets: 313 10*3/uL (ref 150.0–400.0)
RBC: 4.84 Mil/uL (ref 3.87–5.11)
RDW: 13 % (ref 11.5–15.5)
WBC: 7.1 10*3/uL (ref 4.0–10.5)

## 2020-10-25 LAB — HEPATIC FUNCTION PANEL
ALT: 11 U/L (ref 0–35)
AST: 19 U/L (ref 0–37)
Albumin: 4.4 g/dL (ref 3.5–5.2)
Alkaline Phosphatase: 69 U/L (ref 39–117)
Bilirubin, Direct: 0.1 mg/dL (ref 0.0–0.3)
Total Bilirubin: 0.5 mg/dL (ref 0.2–1.2)
Total Protein: 7.5 g/dL (ref 6.0–8.3)

## 2020-10-25 LAB — SEDIMENTATION RATE: Sed Rate: 9 mm/hr (ref 0–20)

## 2020-10-25 LAB — BASIC METABOLIC PANEL
BUN: 9 mg/dL (ref 6–23)
CO2: 28 mEq/L (ref 19–32)
Calcium: 9.1 mg/dL (ref 8.4–10.5)
Chloride: 102 mEq/L (ref 96–112)
Creatinine, Ser: 0.82 mg/dL (ref 0.40–1.20)
GFR: 88.27 mL/min (ref >60.00)
Glucose, Bld: 88 mg/dL (ref 70–99)
Potassium: 3.5 mEq/L (ref 3.5–5.1)
Sodium: 138 mEq/L (ref 135–145)

## 2020-10-25 MED ORDER — METHYLPREDNISOLONE 4 MG PO TBPK: ORAL_TABLET | ORAL | 0 refills | Status: DC

## 2020-10-25 NOTE — Progress Notes (Signed)
Patient ID: Christine Duncan, female   DOB: [birth year], 40 y.o.   MRN: [id number]        Chief Complaint:  Chief Complaint  Patient presents with   Rash    Rash under arms, on hand, back, and some in groin        HPI:  Christine Duncan is a 40 y.o. female here with above, in particular one element at the left palm with developing lesion over 10 days starting from a faint erythem, then seeming pustular, then purplish.  Has multiple lesions various stages to hands, back, arms, groin.  Has intense itching at times.  Initially though related to bed bugs but not now.  Saw UC approx 1 wk ago - doxy course no help for proviisonal dx folliculitis.   Topical steroid helps somewhat.  No prior hx of autoimmune dz or vasculitis.  Pt denies chest pain, increased sob or doe, wheezing, orthopnea, PND, increased LE swelling, palpitations, dizziness or syncope.   Pt denies polydipsia, polyuria, or new focal neuro s/s.   Pt denies fever, wt loss, night sweats, loss of appetite, or other constitutional symptoms   Also incidentally with  urinary symptoms such as frequency  but not dysuria, urgency, flank pain, hematuria or n/v, fever, chills.       Wt Readings from Last 3 Encounters:  10/25/20 158 lb (71.7 kg)  10/15/20 160 lb (72.6 kg)  10/31/17 149 lb 12.8 oz (67.9 kg)   BP Readings from Last 3 Encounters:  10/25/20 128/84  10/15/20 140/87  10/31/17 110/78         Past Medical History:  Diagnosis Date   Acid reflux    Allergy    Anemia    Anxiety    B12 deficiency    Bipolar 1 disorder (HCC)    Blood type, Rh negative    Cervical dysplasia    Depression    Essential tremor    Fibromyalgia    Hives    IBS (irritable bowel syndrome)    Neuromuscular disorder ([place])    fibromylagia   Spastic colon    Past Surgical History:  Procedure Laterality Date   CERVICAL BIOPSY  W/ LOOP ELECTRODE EXCISION     CESAREAN SECTION  2006   CESAREAN SECTION  2011   COLPOSCOPY     DILATION AND CURETTAGE OF UTERUS   2005   INTRAUTERINE DEVICE INSERTION  05/09/2016   LAPAROSCOPIC APPENDECTOMY N/A 08/19/2016   Procedure: APPENDECTOMY LAPAROSCOPIC;  Surgeon: Alphonsa Overall, MD;  Location: [facility];  Service: General;  Laterality: N/A;   LEEP  07/2010   CIN II, Margins free    reports that she has quit smoking. Her smoking use included e-cigarettes and cigarettes. She smoked an average of 1 pack per day. She has never used smokeless tobacco. She reports current alcohol use. She reports that she does not use drugs. family history includes Alcohol abuse in her maternal grandfather; Allergic rhinitis in her brother and paternal aunt; Asthma in her paternal aunt; Bipolar disorder in her maternal grandfather and paternal grandmother; Breast cancer (age of onset: 84) in her paternal grandmother; Cancer in her paternal grandmother; Cancer (age of onset: 5) in her father; Colon cancer in her paternal grandmother; Depression in her maternal grandmother and mother; Diabetes in her maternal grandmother, paternal grandfather, and paternal grandmother; Drug abuse in her maternal grandfather; Esophageal cancer in her father; Hyperlipidemia in her paternal grandmother; Seizures in her maternal grandmother; Stroke in her paternal grandmother; Thyroid  disease in her maternal grandmother and mother. Allergies  Allergen Reactions   Carbidopa-Levodopa Nausea And Vomiting   Sertraline Hcl Other (See Comments)    Reaction:  Manic episode   Current Outpatient Medications on File Prior to Visit  Medication Sig Dispense Refill   amphetamine-dextroamphetamine (ADDERALL) 20 MG tablet Take 20 mg by mouth daily as needed (to focus).   0   cetirizine (ZYRTEC) 10 MG tablet Take 10 mg by mouth daily.     clonazePAM (KLONOPIN) 0.5 MG disintegrating tablet Take 1 mg by mouth 3 (three) times daily as needed (for anxiety).     cyclobenzaprine (FLEXERIL) 5 MG tablet Take 1 tablet (5 mg total) by mouth 3 (three) times daily as needed for muscle spasms.  30 tablet 0   fluconazole (DIFLUCAN) 150 MG tablet Take by mouth.     lamoTRIgine (LAMICTAL) 100 MG tablet Take 100 mg by mouth daily.     omeprazole (PRILOSEC) 20 MG capsule Take 2 capsules by mouth daily. Please disregard last RX sent over signed by Dr Linda Hedges (Patient taking differently: Take 40 mg by mouth daily.) 180 capsule 3   temazepam (RESTORIL) 15 MG capsule Take 15 mg by mouth at bedtime.     Current Facility-Administered Medications on File Prior to Visit  Medication Dose Route Frequency Provider Last Rate Last Admin   methylPREDNISolone acetate (DEPO-MEDROL) injection 80 mg  80 mg Intramuscular Q6 weeks Plotnikov, Evie Lacks, MD            ROS:  All others reviewed and negative.  Objective        PE:  BP 128/84 (BP Location: Right Arm, Patient Position: Sitting, Cuff Size: Normal)   Pulse 90   Temp 99 F (37.2 C) (Oral)   Ht _0  (1.727 m)   Wt 158 lb (71.7 kg)   SpO2 98%   BMI 24.02 kg/m                 Constitutional: Pt appears in NAD               HENT: Head: NCAT.                Right Ear: External ear normal.                 Left Ear: External ear normal.                Eyes: . Pupils are equal, round, and reactive to light. Conjunctivae and EOM are normal               Nose: without d/c or deformity               Neck: Neck supple. Gross normal ROM               Cardiovascular: Normal rate and regular rhythm.                 Pulmonary/Chest: Effort normal and breath sounds without rales or wheezing.                Abd:  Soft, NT, ND, + BS, no organomegaly               Neurological: Pt is alert. At baseline orientation, motor grossly intact               Skin:  LE edema - none, has numerous about 50 erythem itchy lesions small 2 mm to 15 mm throughout  the arms, upper back, left palm, groin               Psychiatric: Pt behavior is normal without agitation   Micro: none  Cardiac tracings I have personally interpreted today:  none  Pertinent Radiological  findings (summarize): none   Lab Results  Component Value Date   WBC 7.1 10/25/2020   HGB 13.8 10/25/2020   HCT 40.8 10/25/2020   PLT 313.0 10/25/2020   GLUCOSE 88 10/25/2020   CHOL 206 (H) 08/01/2016   TRIG 53.0 08/01/2016   HDL 45.80 08/01/2016   LDLCALC 150 (H) 08/01/2016   ALT 11 10/25/2020   AST 19 10/25/2020   NA 138 10/25/2020   K 3.5 10/25/2020   CL 102 10/25/2020   CREATININE 0.82 10/25/2020   BUN 9 10/25/2020   CO2 28 10/25/2020   TSH 0.82 04/03/2017   HGBA1C 5.5 04/03/2017   Assessment/Plan:  Christine Duncan is a 40 y.o. White or Caucasian [1] female with  has a past medical history of Acid reflux, Allergy, Anemia, Anxiety, B12 deficiency, Bipolar 1 disorder ([place]), Blood type, Rh negative, Cervical dysplasia, Depression, Essential tremor, Fibromyalgia, Hives, IBS (irritable bowel syndrome), Neuromuscular disorder ([place]), and Spastic colon.  Rash Etiology unclear, has at least a few lesions suggestive of palpable purpura, declines prednisone but will take medrol pack, to continue topical steroid prn, and labs as ordered including esr and crp, and refer dermatology  Essential hypertension BP Readings from Last 3 Encounters:  10/25/20 128/84  10/15/20 140/87  10/31/17 110/78   Stable, pt to continue medical treatment  - diet, wt control, low salt   Urinary frequency Etiology unclear, for UA r/o uti  Followup: Return if symptoms worsen or fail to improve.  Cathlean Cower, MD 10/26/2020 9:38 PM [facility]

## 2020-10-25 NOTE — Patient Instructions (Signed)
Please take all new medication as prescribed - the medrol  Please continue all other medications as before, and refills have been done if requested.  Please have the pharmacy call with any other refills you may need.  Please continue your efforts at being more active, low cholesterol diet, and weight control.  Please keep your appointments with your specialists as you may have planned  You will be contacted regarding the referral for: Dermatology  Please go to the LAB at the blood drawing area for the tests to be done  You will be contacted by phone if any changes need to be made immediately.  Otherwise, you will receive a letter about your results with an explanation, but please check with MyChart first.  Please remember to sign up for MyChart if you have not done so, as this will be important to you in the future with finding out test results, communicating by private email, and scheduling acute appointments online when needed.  Please also see your PCP for physical

## 2020-10-26 ENCOUNTER — Encounter: Payer: Self-pay | Admitting: Internal Medicine

## 2020-10-26 DIAGNOSIS — R350 Frequency of micturition: Secondary | ICD-10-CM | POA: Insufficient documentation

## 2020-10-26 LAB — URINALYSIS, ROUTINE W REFLEX MICROSCOPIC
Bilirubin Urine: NEGATIVE
Hgb urine dipstick: NEGATIVE
Ketones, ur: NEGATIVE
Leukocytes,Ua: NEGATIVE
Nitrite: NEGATIVE
RBC / HPF: NONE SEEN (ref 0–?)
Specific Gravity, Urine: 1.01 (ref 1.000–1.030)
Total Protein, Urine: NEGATIVE
Urine Glucose: NEGATIVE
Urobilinogen, UA: 0.2 (ref 0.0–1.0)
pH: 6 (ref 5.0–8.0)

## 2020-10-26 NOTE — Assessment & Plan Note (Signed)
Etiology unclear, for UA r/o uti

## 2020-10-26 NOTE — Assessment & Plan Note (Signed)
Etiology unclear, has at least a few lesions suggestive of palpable purpura, declines prednisone but will take medrol pack, to continue topical steroid prn, and labs as ordered including esr and crp, and refer dermatology

## 2020-10-26 NOTE — Assessment & Plan Note (Signed)
BP Readings from Last 3 Encounters:  10/25/20 128/84  10/15/20 140/87  10/31/17 110/78   Stable, pt to continue medical treatment  - diet, wt control, low salt

## 2020-10-27 ENCOUNTER — Encounter: Payer: Self-pay | Admitting: Internal Medicine

## 2020-10-27 LAB — C4 COMPLEMENT: C4 Complement: 39 mg/dL (ref 15–57)

## 2020-10-27 LAB — ANA: Anti Nuclear Antibody (ANA): POSITIVE — AB

## 2020-10-27 LAB — ANTI-DNA ANTIBODY, DOUBLE-STRANDED: ds DNA Ab: 1 IU/mL

## 2020-10-27 LAB — ANTI-NUCLEAR AB-TITER (ANA TITER)
ANA TITER: 1:80 {titer} — ABNORMAL HIGH
ANA Titer 1: 1:40 {titer} — ABNORMAL HIGH

## 2020-10-27 LAB — ANCA PROFILE
Anti-MPO Antibodies: <0.2 units (ref 0.0–0.9)
Anti-PR3 Antibodies: <0.2 units (ref 0.0–0.9)
Atypical pANCA: <1:20 {titer}
C-ANCA: <1:20 {titer}
P-ANCA: <1:20 {titer}

## 2020-10-27 LAB — C3 COMPLEMENT: C3 Complement: 138 mg/dL (ref 83–193)

## 2020-11-01 DIAGNOSIS — D492 Neoplasm of unspecified behavior of bone, soft tissue, and skin: Secondary | ICD-10-CM | POA: Diagnosis not present

## 2020-11-01 DIAGNOSIS — L989 Disorder of the skin and subcutaneous tissue, unspecified: Secondary | ICD-10-CM | POA: Diagnosis not present

## 2020-11-01 DIAGNOSIS — S60522A Blister (nonthermal) of left hand, initial encounter: Secondary | ICD-10-CM | POA: Diagnosis not present

## 2020-11-01 DIAGNOSIS — L0291 Cutaneous abscess, unspecified: Secondary | ICD-10-CM | POA: Diagnosis not present

## 2020-11-10 ENCOUNTER — Other Ambulatory Visit: Payer: Self-pay

## 2020-11-10 ENCOUNTER — Encounter: Payer: Self-pay | Admitting: Internal Medicine

## 2020-11-10 ENCOUNTER — Ambulatory Visit (INDEPENDENT_AMBULATORY_CARE_PROVIDER_SITE_OTHER): Payer: BC Managed Care – PPO | Admitting: Internal Medicine

## 2020-11-10 VITALS — BP 130/88 | HR 88 | Temp 98.2°F | Ht 68.0 in | Wt 159.8 lb

## 2020-11-10 DIAGNOSIS — Z0000 Encounter for general adult medical examination without abnormal findings: Secondary | ICD-10-CM

## 2020-11-10 DIAGNOSIS — R768 Other specified abnormal immunological findings in serum: Secondary | ICD-10-CM | POA: Diagnosis not present

## 2020-11-10 DIAGNOSIS — Z23 Encounter for immunization: Secondary | ICD-10-CM

## 2020-11-10 DIAGNOSIS — R21 Rash and other nonspecific skin eruption: Secondary | ICD-10-CM

## 2020-11-10 DIAGNOSIS — K580 Irritable bowel syndrome with diarrhea: Secondary | ICD-10-CM | POA: Diagnosis not present

## 2020-11-10 LAB — CBC WITH DIFFERENTIAL/PLATELET
Basophils Absolute: 0.1 10*3/uL (ref 0.0–0.1)
Basophils Relative: 0.9 % (ref 0.0–3.0)
Eosinophils Absolute: 0 10*3/uL (ref 0.0–0.7)
Eosinophils Relative: 0.1 % (ref 0.0–5.0)
HCT: 39.9 % (ref 36.0–46.0)
Hemoglobin: 13.4 g/dL (ref 12.0–15.0)
Lymphocytes Relative: 23.9 % (ref 12.0–46.0)
Lymphs Abs: 2.1 10*3/uL (ref 0.7–4.0)
MCHC: 33.5 g/dL (ref 30.0–36.0)
MCV: 85.5 fl (ref 78.0–100.0)
Monocytes Absolute: 0.6 10*3/uL (ref 0.1–1.0)
Monocytes Relative: 6.2 % (ref 3.0–12.0)
Neutro Abs: 6.1 10*3/uL (ref 1.4–7.7)
Neutrophils Relative %: 68.9 % (ref 43.0–77.0)
Platelets: 313 10*3/uL (ref 150.0–400.0)
RBC: 4.67 Mil/uL (ref 3.87–5.11)
RDW: 13.1 % (ref 11.5–15.5)
WBC: 8.9 10*3/uL (ref 4.0–10.5)

## 2020-11-10 LAB — COMPREHENSIVE METABOLIC PANEL
ALT: 12 U/L (ref 0–35)
AST: 14 U/L (ref 0–37)
Albumin: 4.3 g/dL (ref 3.5–5.2)
Alkaline Phosphatase: 54 U/L (ref 39–117)
BUN: 9 mg/dL (ref 6–23)
CO2: 28 mEq/L (ref 19–32)
Calcium: 9 mg/dL (ref 8.4–10.5)
Chloride: 103 mEq/L (ref 96–112)
Creatinine, Ser: 0.73 mg/dL (ref 0.40–1.20)
GFR: 101.46 mL/min (ref >60.00)
Glucose, Bld: 84 mg/dL (ref 70–99)
Potassium: 3.6 mEq/L (ref 3.5–5.1)
Sodium: 138 mEq/L (ref 135–145)
Total Bilirubin: 0.5 mg/dL (ref 0.2–1.2)
Total Protein: 7 g/dL (ref 6.0–8.3)

## 2020-11-10 LAB — LIPID PANEL
Cholesterol: 262 mg/dL — ABNORMAL HIGH (ref 0–200)
HDL: 66.9 mg/dL (ref >39.00)
LDL Cholesterol: 172 mg/dL — ABNORMAL HIGH (ref 0–99)
NonHDL: 194.69
Total CHOL/HDL Ratio: 4
Triglycerides: 115 mg/dL (ref 0.0–149.0)
VLDL: 23 mg/dL (ref 0.0–40.0)

## 2020-11-10 LAB — URINALYSIS
Bilirubin Urine: NEGATIVE
Hgb urine dipstick: NEGATIVE
Ketones, ur: NEGATIVE
Leukocytes,Ua: NEGATIVE
Nitrite: NEGATIVE
Specific Gravity, Urine: 1.015 (ref 1.000–1.030)
Total Protein, Urine: NEGATIVE
Urine Glucose: NEGATIVE
Urobilinogen, UA: 0.2 (ref 0.0–1.0)
pH: 7 (ref 5.0–8.0)

## 2020-11-10 LAB — VITAMIN D 25 HYDROXY (VIT D DEFICIENCY, FRACTURES): VITD: 28.11 ng/mL — ABNORMAL LOW (ref 30.00–100.00)

## 2020-11-10 LAB — TSH: TSH: 0.43 u[IU]/mL (ref 0.35–5.50)

## 2020-11-10 LAB — VITAMIN B12: Vitamin B-12: 162 pg/mL — ABNORMAL LOW (ref 211–911)

## 2020-11-10 LAB — T4, FREE: Free T4: 0.87 ng/dL (ref 0.60–1.60)

## 2020-11-10 MED ORDER — CYCLOBENZAPRINE HCL 5 MG PO TABS: 5.0000 mg | ORAL_TABLET | Freq: Three times a day (TID) | ORAL | 1 refills | Status: DC | PRN

## 2020-11-10 MED ORDER — OMEPRAZOLE 40 MG PO CPDR: 40.0000 mg | DELAYED_RELEASE_CAPSULE | Freq: Every day | ORAL | 3 refills | Status: AC

## 2020-11-10 NOTE — Assessment & Plan Note (Signed)

## 2020-11-10 NOTE — Assessment & Plan Note (Addendum)
The etiology is unclear.  The rash is unusual.  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 Bx report pending.  Will wait till we get the report from Dr. Pearline Cables. Gluten free trial.

## 2020-11-10 NOTE — Progress Notes (Signed)
Subjective:  Patient ID: Jadene Pierini, female    DOB: 03/06/1980  Age: 40 y.o. MRN: [id number]  CC: Annual Exam   HPI JERICHO CIESLIK presents for rash 1.5 months - s/p steroids, abx Pt saw Dr Pearline Cables - had a skin bx 1 week ago. Pt has a cat F/u IBS  Well exam   Outpatient Medications Prior to Visit  Medication Sig Dispense Refill   amphetamine-dextroamphetamine (ADDERALL) 20 MG tablet Take 20 mg by mouth daily as needed (to focus).   0   cetirizine (ZYRTEC) 10 MG tablet Take 10 mg by mouth daily.     clonazePAM (KLONOPIN) 0.5 MG disintegrating tablet Take 1 mg by mouth 3 (three) times daily as needed (for anxiety).     lamoTRIgine (LAMICTAL) 100 MG tablet Take 100 mg by mouth daily.     temazepam (RESTORIL) 15 MG capsule Take 15 mg by mouth at bedtime.     cyclobenzaprine (FLEXERIL) 5 MG tablet Take 1 tablet (5 mg total) by mouth 3 (three) times daily as needed for muscle spasms. 30 tablet 0   omeprazole (PRILOSEC) 20 MG capsule Take 2 capsules by mouth daily. Please disregard last RX sent over signed by Dr Linda Hedges (Patient taking differently: Take 40 mg by mouth daily.) 180 capsule 3   fluconazole (DIFLUCAN) 150 MG tablet Take by mouth. (Patient not taking: Reported on 11/10/2020)     methylPREDNISolone (MEDROL DOSEPAK) 4 MG TBPK tablet 4 tab by mouth x 3day, 2 tabs x 3 day, 1 tab x 3 day (Patient not taking: Reported on 11/10/2020) 21 tablet 0   methylPREDNISolone acetate (DEPO-MEDROL) injection 80 mg      No facility-administered medications prior to visit.    ROS: Review of Systems  Constitutional:  Positive for fatigue. Negative for activity change, appetite change, chills and unexpected weight change.  HENT:  Negative for congestion, mouth sores and sinus pressure.   Eyes:  Negative for visual disturbance.  Respiratory:  Negative for cough and chest tightness.   Gastrointestinal:  Positive for diarrhea. Negative for abdominal pain, nausea and vomiting.  Genitourinary:   Negative for difficulty urinating, frequency and vaginal pain.  Musculoskeletal:  Positive for arthralgias. Negative for back pain and gait problem.  Skin:  Positive for rash. Negative for pallor.  Neurological:  Negative for dizziness, tremors, weakness, numbness and headaches.  Psychiatric/Behavioral:  Negative for confusion and sleep disturbance.    Objective:  BP 130/88 (BP Location: Left Arm)   Pulse 88   Temp 98.2 F (36.8 C) (Oral)   Ht 5\' 8"  (1.727 m)   Wt 159 lb 12.8 oz (72.5 kg)   SpO2 99%   BMI 24.30 kg/m   BP Readings from Last 3 Encounters:  11/10/20 130/88  10/25/20 128/84  10/15/20 140/87    Wt Readings from Last 3 Encounters:  11/10/20 159 lb 12.8 oz (72.5 kg)  10/25/20 158 lb (71.7 kg)  10/15/20 160 lb (72.6 kg)    Physical Exam Constitutional:      General: She is not in acute distress.    Appearance: She is well-developed.  HENT:     Head: Normocephalic.     Right Ear: External ear normal.     Left Ear: External ear normal.     Nose: Nose normal.  Eyes:     General:        Right eye: No discharge.        Left eye: No discharge.     Conjunctiva/sclera:  Conjunctivae normal.     Pupils: Pupils are equal, round, and reactive to light.  Neck:     Thyroid: No thyromegaly.     Vascular: No JVD.     Trachea: No tracheal deviation.  Cardiovascular:     Rate and Rhythm: Normal rate and regular rhythm.     Heart sounds: Normal heart sounds.  Pulmonary:     Effort: No respiratory distress.     Breath sounds: No stridor. No wheezing.  Abdominal:     General: Bowel sounds are normal. There is no distension.     Palpations: Abdomen is soft. There is no mass.     Tenderness: There is no abdominal tenderness. There is no guarding or rebound.  Musculoskeletal:        General: Tenderness present.     Cervical back: Normal range of motion and neck supple. No rigidity.  Lymphadenopathy:     Cervical: No cervical adenopathy.  Skin:    Findings: No erythema  or rash.  Neurological:     Cranial Nerves: No cranial nerve deficit.     Motor: No abnormal muscle tone.     Coordination: Coordination normal.     Deep Tendon Reflexes: Reflexes normal.  Psychiatric:        Behavior: Behavior normal.        Thought Content: Thought content normal.        Judgment: Judgment normal.  Rash elements pictures were reviewed on the patient's telephone.  Hemorrhagic round lesion on the palm seems to be healing. I spent 22 minutes in addition to time for CPX wellness examination in preparing to see the patient by review of recent labs, imaging and procedures, obtaining and reviewing separately obtained history, communicating with the patient, ordering medications, tests or procedures, and documenting clinical information in the EHR including the differential diagnosis, treatment, and any further evaluation and other management of skin rash - ?Pityriasis lichenoides is suspected, IBS, possible gluten intolerance; GERD, elevated ANA        Lab Results  Component Value Date   WBC 7.1 10/25/2020   HGB 13.8 10/25/2020   HCT 40.8 10/25/2020   PLT 313.0 10/25/2020   GLUCOSE 88 10/25/2020   CHOL 206 (H) 08/01/2016   TRIG 53.0 08/01/2016   HDL 45.80 08/01/2016   LDLCALC 150 (H) 08/01/2016   ALT 11 10/25/2020   AST 19 10/25/2020   NA 138 10/25/2020   K 3.5 10/25/2020   CL 102 10/25/2020   CREATININE 0.82 10/25/2020   BUN 9 10/25/2020   CO2 28 10/25/2020   TSH 0.82 04/03/2017   HGBA1C 5.5 04/03/2017    No results found.  Assessment & Plan:   Problem List Items Addressed This Visit     IBS    Gluten free trial      Relevant Medications   omeprazole (PRILOSEC) 40 MG capsule   Other Relevant Orders   TSH   T4, free   Vitamin B12   VITAMIN D 25 Hydroxy (Vit-D Deficiency, Fractures)   Rash    Pt saw Dr Pearline Cables - had a skin bx 1 week ago; Pityriasis lichenoides is suspected. Bx report pending Gluten free trial      Relevant Orders   Vitamin B12    VITAMIN D 25 Hydroxy (Vit-D Deficiency, Fractures)   Well adult exam - Primary     We discussed age appropriate health related issues, including available/recomended screening tests and vaccinations. Labs were ordered to be later reviewed . All  questions were answered. We discussed one or more of the following - seat belt use, use of sunscreen/sun exposure exercise, fall risk reduction, second hand smoke exposure, firearm use and storage, seat belt use, a need for adhering to healthy diet and exercise. Labs were ordered.  All questions were answered. tDAP GYN for PAP      Relevant Orders   TSH   Urinalysis   CBC with Differential/Platelet   Lipid panel   Comprehensive metabolic panel   T4, free   Vitamin B12   VITAMIN D 25 Hydroxy (Vit-D Deficiency, Fractures)      Meds ordered this encounter  Medications   omeprazole (PRILOSEC) 40 MG capsule    Sig: Take 1 capsule (40 mg total) by mouth daily.    Dispense:  90 capsule    Refill:  3   cyclobenzaprine (FLEXERIL) 5 MG tablet    Sig: Take 1 tablet (5 mg total) by mouth 3 (three) times daily as needed for muscle spasms.    Dispense:  30 tablet    Refill:  1       Follow-up: Return in about 3 months (around 02/10/2021) for a follow-up visit.  Walker Kehr, MD

## 2020-11-10 NOTE — Assessment & Plan Note (Signed)
Gluten free trial

## 2020-11-10 NOTE — Patient Instructions (Signed)
  Gluten free trial for 4-6 weeks. OK to use gluten-free bread and gluten-free pasta.    Gluten-Free Diet for Celiac Disease, Adult The gluten-free diet includes all foods that do not contain gluten. Gluten is a protein that is found in wheat, rye, barley, and some other grains. Following the gluten-free diet is the only treatment for people with celiac disease. It helps to prevent damage to the intestines and improves or eliminates the symptoms of celiac disease. Following the gluten-free diet requires some planning. It can be challenging at first, but it gets easier with time and practice. There are more gluten-free options available today than ever before. If you need help finding gluten-free foods or if you have questions, talk with your diet and nutrition specialist (registered dietitian) or your health care provider. What do I need to know about a gluten-free diet?  All fruits, vegetables, and meats are safe to eat and do not contain gluten.  When grocery shopping, start by shopping in the produce, meat, and dairy sections. These sections are more likely to contain gluten-free foods. Then move to the aisles that contain packaged foods if you need to.  Read all food labels. Gluten is often added to foods. Always check the ingredient list and look for warnings, such as "may contain gluten."  Talk with your dietitian or health care provider before taking a gluten-free multivitamin or mineral supplement.  Be aware of gluten-free foods having contact with foods that contain gluten (cross-contamination). This can happen at home and with any processed foods. ? Talk with your health care provider or dietitian about how to reduce the risk of cross-contamination in your home. ? If you have questions about how a food is processed, ask the manufacturer. What key words help to identify gluten? Foods that list any of these key words on the label usually contain gluten:  Wheat, flour, enriched  flour, bromated flour, white flour, durum flour, graham flour, phosphated flour, self-rising flour, semolina, farina, barley (malt), rye, and oats.  Starch, dextrin, modified food starch, or cereal.  Thickening, fillers, or emulsifiers.  Malt flavoring, malt extract, or malt syrup.  Hydrolyzed vegetable protein.  In the [place] packaged foods that are gluten-free are required to be labeled "GF." These foods should be easy to identify and are safe to eat. In the [place] food companies are also required to list common food allergens, including wheat, on their labels. Recommended foods Grains  Amaranth, bean flours, 100% buckwheat flour, corn, millet, nut flours or nut meals, GF oats, quinoa, rice, sorghum, teff, rice wafers, pure cornmeal tortillas, popcorn, and hot cereals made from cornmeal. Hominy, rice, wild rice. Some Asian rice noodles or bean noodles. Arrowroot starch, corn bran, corn flour, corn germ, cornmeal, corn starch, potato flour, potato starch flour, and rice bran. Plain, brown, and sweet rice flours. Rice polish, soy flour, and tapioca starch. Vegetables  All plain fresh, frozen, and canned vegetables. Fruits  All plain fresh, frozen, canned, and dried fruits, and 100% fruit juices. Meats and other protein foods  All fresh beef, pork, poultry, fish, seafood, and eggs. Fish canned in water, oil, brine, or vegetable broth. Plain nuts and seeds, peanut butter. Some lunch meat and some frankfurters. Dried beans, dried peas, and lentils. Dairy  Fresh plain, dry, evaporated, or condensed milk. Cream, butter, sour cream, whipping cream, and most yogurts. Unprocessed cheese, most processed cheeses, some cottage cheese, some cream cheeses. Beverages  Coffee, tea, most herbal teas. Carbonated beverages and some root beers.   Wine, sake, and distilled spirits, such as gin, vodka, and whiskey. Most hard ciders. Fats and oils  Butter, margarine, vegetable oil, hydrogenated butter, olive  oil, shortening, lard, cream, and some mayonnaise. Some commercial salad dressings. Olives. Sweets and desserts  Sugar, honey, some syrups, molasses, jelly, and jam. Plain hard candy, marshmallows, and gumdrops. Pure cocoa powder. Plain chocolate. Custard and some pudding mixes. Gelatin desserts, sorbets, frozen ice pops, and sherbet. Cake, cookies, and other desserts prepared with allowed flours. Some commercial ice creams. Cornstarch, tapioca, and rice puddings. Seasoning and other foods  Some canned or frozen soups. Monosodium glutamate (MSG). Cider, rice, and wine vinegar. Baking soda and baking powder. Cream of tartar. Baking and nutritional yeast. Certain soy sauces made without wheat (ask your dietitian about specific brands that are allowed). Nuts, coconut, and chocolate. Salt, pepper, herbs, spices, flavoring extracts, imitation or artificial flavorings, natural flavorings, and food colorings. Some medicines and supplements. Some lip glosses and other cosmetics. Rice syrups. The items listed may not be a complete list. Talk with your dietitian about what dietary choices are best for you. Foods to avoid Grains  Barley, bran, bulgur, couscous, cracked wheat, [place], farro, graham, malt, matzo, semolina, wheat germ, and all wheat and rye cereals including spelt and kamut. Cereals containing malt as a flavoring, such as rice cereal. Noodles, spaghetti, macaroni, most packaged rice mixes, and all mixes containing wheat, rye, barley, or triticale. Vegetables  Most creamed vegetables and most vegetables canned in sauces. Some commercially prepared vegetables and salads. Fruits  Thickened or prepared fruits and some pie fillings. Some fruit snacks and fruit roll-ups. Meats and other protein foods  Any meat or meat alternative containing wheat, rye, barley, or gluten stabilizers. These are often marinated or packaged meats and lunch meats. Bread-containing products, such as Swiss steak,  croquettes, meatballs, and meatloaf. Most tuna canned in vegetable broth and turkey with hydrolyzed vegetable protein (HVP) injected as part of the basting. Seitan. Imitation fish. Eggs in sauces made from ingredients to avoid. Dairy  Commercial chocolate milk drinks and malted milk. Some non-dairy creamers. Any cheese product containing ingredients to avoid. Beverages  Certain cereal beverages. Beer, ale, malted milk, and some root beers. Some hard ciders. Some instant flavored coffees. Some herbal teas made with barley or with barley malt added. Fats and oils  Some commercial salad dressings. Sour cream containing modified food starch. Sweets and desserts  Some toffees. Chocolate-coated nuts (may be rolled in wheat flour) and some commercial candies and candy bars. Most cakes, cookies, donuts, pastries, and other baked goods. Some commercial ice cream. Ice cream cones. Commercially prepared mixes for cakes, cookies, and other desserts. Bread pudding and other puddings thickened with flour. Products containing brown rice syrup made with barley malt enzyme. Desserts and sweets made with malt flavoring. Seasoning and other foods  Some curry powders, some dry seasoning mixes, some gravy extracts, some meat sauces, some ketchups, some prepared mustards, and horseradish. Certain soy sauces. Malt vinegar. Bouillon and bouillon cubes that contain HVP. Some chip dips, and some chewing gum. Yeast extract. Brewer's yeast. Caramel color. Some medicines and supplements. Some lip glosses and other cosmetics. The items listed may not be a complete list. Talk with your dietitian about what dietary choices are best for you. Summary  Gluten is a protein that is found in wheat, rye, barley, and some other grains. The gluten-free diet includes all foods that do not contain gluten.  If you need help finding gluten-free foods or if   you have questions, talk with your diet and nutrition specialist (registered  dietitian) or your health care provider.  Read all food labels. Gluten is often added to foods. Always check the ingredient list and look for warnings, such as "may contain gluten." This information is not intended to replace advice given to you by your health care provider. Make sure you discuss any questions you have with your health care provider. Document Released: 01/02/2005 Document Revised: 10/18/2015 Document Reviewed: 10/18/2015 Elsevier Interactive Patient Education  2018 [employer]   

## 2020-11-10 NOTE — Assessment & Plan Note (Signed)
Mild - likely non-specific Will watch

## 2020-11-12 NOTE — Telephone Encounter (Signed)
Patient calling to check status of message sent  See Below

## 2020-11-14 ENCOUNTER — Other Ambulatory Visit: Payer: Self-pay | Admitting: Internal Medicine

## 2020-11-14 MED ORDER — VITAMIN D (ERGOCALCIFEROL) 1.25 MG (50000 UNIT) PO CAPS: 50000.0000 [IU] | ORAL_CAPSULE | ORAL | 0 refills | Status: DC

## 2020-11-14 MED ORDER — VITAMIN B-12 5000 MCG SL SUBL: SUBLINGUAL_TABLET | SUBLINGUAL | 3 refills | Status: DC

## 2020-11-14 MED ORDER — VITAMIN D3 50 MCG (2000 UT) PO CAPS: 2000.0000 [IU] | ORAL_CAPSULE | Freq: Every day | ORAL | 3 refills | Status: DC

## 2020-11-15 NOTE — Telephone Encounter (Signed)
Patient called stating her pathology report will be faxed over today 11-15-2020 by Dr. Pearline Cables w/ [facility]

## 2020-11-18 ENCOUNTER — Ambulatory Visit: Payer: BC Managed Care – PPO | Admitting: Internal Medicine

## 2020-11-18 ENCOUNTER — Other Ambulatory Visit: Payer: Self-pay

## 2020-11-18 ENCOUNTER — Encounter: Payer: Self-pay | Admitting: Internal Medicine

## 2020-11-18 VITALS — BP 124/90 | HR 89 | Temp 98.3°F

## 2020-11-18 DIAGNOSIS — K580 Irritable bowel syndrome with diarrhea: Secondary | ICD-10-CM

## 2020-11-18 DIAGNOSIS — C866 Primary cutaneous CD30-positive T-cell proliferations not having achieved remission: Secondary | ICD-10-CM | POA: Insufficient documentation

## 2020-11-18 DIAGNOSIS — E538 Deficiency of other specified B group vitamins: Secondary | ICD-10-CM

## 2020-11-18 MED ORDER — CYANOCOBALAMIN 1000 MCG/ML IJ SOLN
1000.0000 ug | Freq: Once | INTRAMUSCULAR | Status: AC
Start: 2020-11-18 — End: 2020-11-18
  Administered 2020-11-18: 1000 ug via INTRAMUSCULAR

## 2020-11-18 MED ORDER — CYANOCOBALAMIN 1000 MCG/ML IJ SOLN: INTRAMUSCULAR | 6 refills | Status: DC

## 2020-11-18 MED ORDER — "BD ECLIPSE SYRINGE 25G X 1"" 3 ML MISC": 3 refills | Status: DC

## 2020-11-18 MED ORDER — CLOBETASOL PROPIONATE 0.05 % EX CREA: 1.0000 "application " | TOPICAL_CREAM | Freq: Two times a day (BID) | CUTANEOUS | 3 refills | Status: DC

## 2020-11-18 NOTE — Patient Instructions (Addendum)
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 LyP is part of the group of cutaneous CD30+ lymphoproliferative disorders (LPDs) that includes primary cutaneous anaplastic large cell lymphoma (PC-ALCL) and borderline CD30+ lesions. Clinicopathologic correlation is essential for establishing the diagnosis of LyP, as there are many clinical and histologic mimics of LyP. LyP is characterized by a chronic course, lasting years or decades. Grouped or generalized papules and nodules develop and spontaneously regress over weeks to months (picture 3B). Patients with LyP have an excellent prognosis, although they are at increased risk of developing a second cutaneous or nodal lymphoma, such as mycosis fungoides, PC-ALCL, or Hodgkin lymphoma. This topic will discuss the clinical presentation, diagnosis, and treatment of LyP. Other cutaneous T cell lymphomas are discussed separately. ?(See "Clinical manifestations, pathologic features, and diagnosis of systemic anaplastic large cell lymphoma".) ?(See "Clinical manifestations, pathologic features, and diagnosis of mycosis fungoides".) ?(See "Clinical manifestations, pathologic features, and diagnosis of subcutaneous panniculitis-like T cell lymphoma".) EPIDEMIOLOGY 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LyP) is a rare disease. The estimated incidence is 1.2 to 1.9 cases per million persons per year [3]. It occurs in patients of all ethnic groups with a peak incidence in the fifth decade. However, LyP may occur in children and individuals older than 50 years [4-6]. A bimodal distribution by age and sex has been noted, with a female predominance among pediatric cases and a female predominance among adult cases (figure 1) [7]. Most patients with LyP and 6p25.3 gene rearrangement are older adults [8]. ETIOLOGY AND PATHOGENESIS The cause of lymphomatoid papulosis (LyP)  is unknown. The hypothesis of a viral etiology has not been confirmed. In several studies, oncogenic or lymphotropic viruses, including Epstein-Barr virus, herpes viruses 6, 7, and 8, and human T cell leukemia/lymphoma virus were not detected in LyP lesions [9-13]. Atopy may have a role in the pathogenesis of LyP. In a study of 28 patients with LyP and 65 healthy controls, approximately 50 percent of patients with LyP had serologic evidence of atopy compared with 3 percent of controls [14]. Overexpression of CD30 -- The overexpression of CD30 on the large atypical T cells found in the skin lesions is the hallmark of LyP and primary cutaneous anaplastic large cell lymphoma (PC-ALCL). CD30 is a cell surface cytokine receptor belonging to the tumor necrosis factor (TNF) receptor superfamily 8, first identified on the Reed-Sternberg cells in Hodgkin lymphoma. CD30 is expressed on activated T and B cells but not on mature circulating T or B cells. The interaction of CD30 with its membrane-associated glycoprotein ligand (CD30L) activates the nuclear factor kappa-B (NFkB) signal transduction pathway, resulting in either cell proliferation or apoptosis [15-17]. The genetic abnormalities underlying the CD30 overexpression in LyP are largely unknown. One study found that two allelic forms of the NT70 promoter microsatellite repressive element, designated [id number] and [id number], were associated with the development of LyP and progression to CD30+ lymphomas in patients with LyP [18]. These findings suggest that allele-specific differences in the control of CD30 transcription may have a role in the pathogenesis of CD30+ cutaneous lymphoproliferative disorders (LPDs). Clonality of T cells -- Monoclonal rearrangement of the T cell receptor (TCR) genes has been detected in 40 to 100 percent of the LyP skin lesions [19-23]. (See "T-B-NK+ SCID: Pathogenesis, clinical manifestations, and diagnosis", section on 'T cell receptor  generation'.) In patients with LyP and associated lymphoma, identical rearrangements have been identified in LyP lesions and lymphoma cells [24-32]. (See '  Associated hematologic malignancies' below.) Several studies indicate that the CD30+ cells are the clonal T cells in LyP [19,24,25,33,34]. However, it is not known whether the clonal T cells persist in the skin or in the peripheral blood during the remission periods [22]. In an analysis of the TCR gene rearrangements, multiple skin and blood samples were obtained from patients with LyP at the time of active disease and remission [20]. Overall, clonal T cell populations were detected in 78 percent of the skin samples and in 36 percent of blood samples. The direct comparison of the clonal T cell populations detected in the lesional skin and corresponding blood sample of each patient revealed a different rearrangement pattern in all instances. These findings suggest that T cell clones detected in the peripheral blood of patients with LyP are non-neoplastic and represent a reactive response to an unknown antigen. Genetic abnormalities -- Genetic abnormalities found in LyP by classic cytogenetic studies include aneuploidy and chromosomal aberrations [10,35]. The t(2;5)(p23,q35) translocation characteristic of systemic ALCL has not been found in LyP cells [36]. However, there is immunohistochemical evidence of increased expression in LyP cells of the oncogenic transcription factor gene Fra2 and inhibitor of differentiation gene Id2, which are located in close proximity to the t(2;5) breakpoint, suggesting a pathogenetic relationship between LyP and systemic ALCL [37]. A subset of LyP cases affecting mainly older adult patients has been found to have chromosomal rearrangements involving the DUSP22-IRF4 locus on 6p25.3, a genetic abnormality seen in approximately 28 percent of PC-ALCLs but absent in 97 percent of conventional types of LyP. Histologically, lesions  showed a biphasic growth pattern, with small cerebriform lymphocytes in the epidermis and larger transformed lymphocytes in the dermis. All had a T cell immunophenotype but were often negative for both CD4 and CD8. The pathologic features raised the possibility of an aggressive T cell lymphoma such as transformed mycosis fungoides. However, no patient developed disseminated skin disease or extracutaneous spread. Untreated lesions regressed spontaneously, characteristic of LyP [8]. A novel, recurrent NPM1-TYK2 gene fusion has been reported in biopsy samples of LyP and PC-ALCL [38]. This finding supports the hypothesis that NPM1-TYK2 mediates the activation of STAT1/3/5 signaling to promote cell proliferation. The inactivation of TYK2 significantly diminished proliferation, suggesting that TYK2 is an oncogenic driver kinase and a potential therapeutic target in a subset of CD30+ cutaneous LPDs. Spontaneous regression -- The mechanisms underlying the spontaneous regression of LyP lesions are unknown. The prevailing hypothesis is that the coexpression of CD30 and the death receptor CD95 (Fas) ligand in LyP lesions may be responsible for the self-regression of LyP lesions by enhanced apoptosis of proliferating cells [39]. This hypothesis is supported by several observations: ?LyP and plaque-stage mycosis fungoides (MF) have a higher apoptosis/proliferation ratio than more aggressive cutaneous lymphomas such as tumor stage MF, cutaneous T cell lymphomas with tumor masses, or cutaneous B cell lymphoma [40,41]. ?The large atypical CD30+ cells of LyP have a low expression of the anti-apoptotic Bcl-2 protein [42,43]. ?The pro-apoptotic protein BAX is expressed at higher levels in LyP and CD30+ ALCL than in systemic CD30+ lymphoma, which does not undergo spontaneous regression and has an aggressive clinical behavior [41]. PATHOLOGY  Histologic features -- The histology of lymphomatoid papulosis (LyP) varies according to  the age of the lesion. Early lesions show a limited perivascular collection of lymphocytes with few inflammatory cells and large atypical cells. As lesions evolve, there is accumulation of dermal lymphocytes admixed with large atypical cells resembling immunoblasts, including bi- and multi-nucleated forms similar to  Hodgkin/Reed-Sternberg cells of Hodgkin lymphoma (picture 1A). A variable number of inflammatory cells, mostly neutrophils and eosinophils, are also seen. Neutrophils are usually found within the lumina of small dermal capillaries and serve as a useful diagnostic marker of LyP. Some degree of neutrophilic infiltration of the overlying epidermis is commonly seen, especially in advanced lesions, and may be associated with initial ulceration. Fully developed LyP lesions typically have a wedge-shaped configuration with a deep-lying apex (picture 1B). There are six major histologic types of LyP lesions: ?Type A - Type A is the most common (approximately 75 percent of cases) and is characterized by a wedge-shaped infiltrate of scattered or clustered large atypical CD30+ cells, intermingled with numerous inflammatory cells, such as small lymphocytes, neutrophils, eosinophils, and histiocytes (picture 1B). Type A resembles the polymorphous infiltrates of Hodgkin lymphoma. Mitoses are frequent and may be atypical. ?Type B - Type B lesions show a predominant epidermotropic infiltrate of smaller atypical CD30+ or CD30- cells with cerebriform nuclei that histologically resembles mycosis fungoides (MF). Unlike MF, LyP type B infiltrates are associated with papular lesions that regress spontaneously. ?Type C - Type C lesions contain large dermal clusters or sheets of large atypical CD30+ cells with relatively few inflammatory cells. These lesions resemble foci of cutaneous anaplastic large cell lymphoma (ALCL), and the distinction from ALCL is largely based on clinical presentation. (See "Clinical manifestations,  pathologic features, and diagnosis of systemic anaplastic large cell lymphoma".) ?Type D - Type D (CD8+ cytotoxic T cell lymphoma-like) lesions are characterized by a pagetoid infiltrate of epidermotropic small to medium-sized atypical CD8+ and CD30+ lymphoid cells that resembles primary cutaneous aggressive epidermotropic CD8+ cytotoxic T cell lymphoma [44,45]. However, clinically, the lesions appear and behave as LyP. ?Type E - Type E has consistent dermal angiocentric infiltrates of small and medium-sized to large pleomorphic cells expressing uniformly CD30 and focally CD8 that infiltrate the walls of small to medium-sized dermal, and in some cases subcutaneous, blood vessels (picture 2). Vasculitis with fibrin deposition in vessel walls is described. Vascular thrombosis occurs in one-half of cases. Extensive extravasation of erythrocytes from damaged blood vessels also may occur [46]. ?LyP with DUSP22:6p25.3 rearrangement - This variant has a distinctive biphasic histology with small to medium-sized epidermotropic cerebriform lymphocytes and large pleomorphic dermal lymphocytes. CD30 staining is biphasic with usual stronger staining of dermal compared with epidermal cells. Atypical cells are often double-negative for CD4 and CD8 or CD8+. Most patients with LyP and 6p25.3 gene rearrangement are older adults [8]. Follicular LyP is another rare histopathologic variant characterized by a perifollicular infiltrate of CD30+ atypical medium to large lymphoid cells, with variable degree of folliculotropism, follicular mucinosis, and presence of neutrophils in the hair follicle infundibula [56]. Rare, granulomatous and syringotropic variants have been described [48]. Immunophenotype -- The large atypical immunoblast-like cells of LyP have the phenotype of activated T cells expressing Hodgkin lymphoma-related antigens [49]. In type A and type C LyP, these cells have an aberrant T cell phenotype lacking one or more  common T cell antigens (CD3, CD2, CD5, or CD7) and are usually CD4+ and less often CD8+. They express CD30, CD25, HLA-DR and CD71, and occasionally CD15 or CD56. In type B LyP, the atypical cells with cerebriform nuclei have a CD3+, CD4+, CD8- phenotype and variable expression of CD30. Large cells in types D are CD30+ CD8+ and in type E are CD30+ and CD8+ in most cases. In LyP with 6p25.3 rearrangement, large cells are often double-negative for CD4 and CD8, but CD3- and TCR-beta  F1-positive. CD30 is positive in both dermal and smaller epidermotropic cells [8]. Cytotoxic granules (granzyme B, perforin, T cell intracellular antigen [TIA]-1) can be found within the cytoplasm in approximately 50 percent of cases [50]. SATB1 (special AT-rich sequence-binding protein 1), a thymocyte nuclear chromatin organizer that plays a crucial role in T cell development, was found to be overexpressed in the CD30+ anaplastic T cells in CD30+ cutaneous lymphoproliferative disorders (LPDs), including LyP [51]. In a series of 68 patients with CD30+ cutaneous LPD (12 with LyP), SATB1+ cases demonstrated a T helper (Th)-17 cytokine profile in anaplastic T cells and a marked inflammatory histopathologic response, including more prominent epidermal hyperplasia, epidermotropism, neutrophilic infiltrate, and vasculitic changes [52]. Interestingly, SATB1+ cases responded better to combined therapy of low-dose methotrexate and interferon-alfa-2b. Genetic features -- Clonal rearrangement of the TCR genes is observed in 40 to 100 percent of cases of LyP [19-22]. Identical rearrangements have been demonstrated in LyP lesions and associated lymphomas [24-32]. The t(2;5)(p23;q35) translocation involving the anaplastic lymphoma kinase (ALK), a marker of systemic ALCL, is not found in LyP or in cutaneous ALCL. Only one recurrent rearrangement of 6p25.3 has been described in a small group of older adult patients [8]. CLINICAL FEATURES Lymphomatoid  papulosis (LyP) manifests as a chronic, recurrent eruption of papules and nodules that spontaneously regress. Some patients, particularly children, experience pruritus associated with the eruption [23,53]. Other systemic symptoms, including fever, sweats, or weight loss, are absent in patients with uncomplicated LyP. Their presence raises the suspicion of an associated systemic lymphoma and warrants further evaluation. (See 'Further diagnostic evaluation' below.) The lesions of LyP are grouped or generalized and usually coexist in different developmental stages (picture 3C). Early lesions appear as small erythematous or violaceous papules, which evolve to larger papules or nodules that may develop central hemorrhage, necrosis, and crusting and subsequently disappear in three to eight weeks (picture 3A-C). The lesions of LyP are generally smaller than 2 cm. Larger lesions are occasionally seen and are clinically indistinguishable from those of primary or secondary anaplastic large cell lymphoma (ALCL (picture 4A-C)) [54]. (See "Clinical manifestations, pathologic features, and diagnosis of systemic anaplastic large cell lymphoma", section on 'Primary cutaneous ALCL'.) A type E variant with clinical and histologic manifestations simulating highly aggressive angiocentric and angiodestructive T cell lymphoma has been described [46,55]. Patients present with a few papulonodular lesions that rapidly evolve to large ulcerations covered by a hemorrhagic and necrotic crust (picture 5). (See 'Histologic features' above.) There is no preferential site of involvement. Lesions typically occur on the extremities and may involve the hands, face, and genitalia. Mucous membranes (eg, oral or vaginal mucosa), are rarely involved [56-58]. Localized or regional forms have been reported in a few patients [59-62]. Localized, circumscribed lesions can resemble limited plaque mycosis fungoides and have been reported to progress  similarly to untreated mycosis fungoides [59]. Follicular and pustular variants have also been described [63-66]. The evolution of an individual lesion generally occurs over three to eight weeks; larger nodules may take months to heal. As lesions resolve, they may take on a brownish-red color and disappear, leaving an area of increased pigmentation or scarring (picture 3B). Ulcerated and necrotic lesions may leave hypo- or hyperpigmented, depressed, varioliform scars. The duration of the disease is highly variable. Crops of lesions may recur and regress for months, years, or even decades. ASSOCIATED HEMATOLOGIC MALIGNANCIES Patients with lymphomatoid papulosis (LyP) have a lifelong increased risk of hematologic malignancies, most frequently mycosis fungoides (MF) and cutaneous or systemic anaplastic large cell lymphoma (  ALCL) and, less commonly, B cell or Hodgkin lymphoma [4,6,67,68]. LyP-associated hematologic malignancies may occur before, concurrent with, or after the onset of LyP [4,6,23,28,53,67,69]. In the largest cohort study that included 504 patients with LyP, 78 patients (15.5 percent) developed a hematologic malignancy after a median follow-up of 120 months [68]. Of these 78 patients, 31 had MF, 29 had ALCL, and 19 had other hematologic malignancies, mainly of B cell or myeloid origin. Some cases originally believed to be Hodgkin lymphoma may actually represent T cell lymphomas with Reed-Sternberg-like cells expressing CD30 and CD15 [70]. (See "Clinical manifestations, pathologic features, and diagnosis of systemic anaplastic large cell lymphoma" and "Clinical manifestations, pathologic features, and diagnosis of mycosis fungoides" and "Hodgkin lymphoma: Epidemiology and risk factors".) A clonal relationship between LyP and the associated lymphomas has been established in most of the cases analyzed [24-32]. The finding of identical TCR gene rearrangements in LyP and LyP-associated lymphomas [25]  suggests that both disorders arise from a common lymphoid progenitor [71]. However, the molecular mechanisms underlying the progression of LyP to malignant lymphoma are unknown. The progression of LyP to ALCL appears to be mediated by the loss of cell growth regulation induced by inactivating mutations of cell surface receptors for transforming growth factor (TGF)-beta [33,34,72]. In a study of 48 [place] patients with LyP, the mean age of patients who developed lymphoma was 50 years older than patients without lymphoma [73]. There are no other clinical or histopathologic criteria to predict the progression of LyP to a malignant lymphoma [4]. Observational studies have identified several potential markers of increased risk, including monoclonal TCR gene rearrangement [73,74]; coexistence of different histologic types of LyP in an individual patient; B and C histologic subtypes [69]; fascin expression [75]; or high blood levels of soluble CD30, CD25, interleukin (IL) 6, and IL-8. [76]. However, their role in disease progression has not been established. DIAGNOSIS  Clinical suspicion -- The diagnosis of lymphomatoid papulosis (LyP) is clinically suspected in a patient presenting with: ?History of a recurrent eruption of papules and nodules that evolve and regress spontaneously in the absence of systemic B symptoms. (See "Clinical presentation and diagnosis of classic Hodgkin lymphoma in adults", section on 'B symptoms'.) ?Grouped or generalized papules and nodules in different stages of evolution, some with central ulceration, crusting, or eschar (picture 3A-E) (see 'Clinical features' above) Biopsy -- A skin biopsy for histopathologic, immunohistochemical, and molecular genetic evaluation is necessary to confirm the diagnosis. The complete excision of two or more inflammatory lesions without signs of necrosis or involution provides the optimal specimens for the pathologist. The definitive diagnosis is based  upon the correlation of the clinical appearance and course of the skin lesions with the following histologic, immunophenotypic, and cytogenetic features [77]: ?A wedge-shaped infiltrate of large atypical T cells, intermingled with numerous inflammatory cells, including small lymphocytes, neutrophils, eosinophils, and histiocytes (type A LyP) (picture 1A-B) (see 'Histologic features' above) ?Expression of CD30 on immunohistochemistry (picture 1B) (see 'Immunophenotype' above) ?Clonal rearrangement of the TCR genes (in 40 to 100 percent of cases) (see 'Genetic features' above) Diagnostic pitfalls -- The diagnosis of LyP may be difficult or delayed in some patients for several reasons: ?Histologic variability - The histology of LyP is highly variable. The histologic subtypes of LyP probably represent evolutionary stages of the skin lesions and may be seen in individual patients at presentation or at different times in the course of the disease. (See 'Histologic features' above.) ?Histologic overlap - There is considerable clinical, histologic, and immunophenotypic overlap between  LyP and primary cutaneous anaplastic large cell lymphoma (PC-ALCL). As an example, the finding of scattered CD30+ blast cells in a background of mixed inflammatory infiltrate is highly suggestive of LyP, but may be also seen in [place] presenting with ulcerated lesions [2]. Conversely, dermal clusters or sheets of large atypical CD30+ cells with relatively few inflammatory cells are characteristic of PC-ALCL, but can also be found in patients with clinical presentation and course of LyP. (See 'CD30+ lymphoproliferative diseases' below.) ?Mimics of CD30+ lymphoproliferative diseases - CD30+ lymphoid cells can be seen in a wide range of inflammatory and infectious disorders and other lymphoid neoplasms that may be clinically and histologically indistinguishable from LyP. Appropriate clinicopathologic correlation is essential for accurate  diagnosis. (See 'CD30+ inflammatory and infectious diseases' below.) FURTHER DIAGNOSTIC EVALUATION Following the diagnosis of lymphomatoid papulosis (LyP), additional evaluation may be needed in some patients to exclude a concurrent malignant lymphoma. Elements of history that increase the risk of malignant lymphoma include: ?Previous lymphoid neoplasm, particularly Hodgkin lymphoma, systemic anaplastic large cell lymphoma (ALCL), or mycosis fungoides (MF) ?Systemic symptoms, such as unexplained weight loss, fever, night sweats, shortness of breath, abdominal fullness ?HIV infection or immunosuppressive therapy Examination findings that may suggest malignant lymphoma include: ?Morphology, size, and extent of skin lesions (isolated or few lesions that are larger than 2 cm are more suggestive of cutaneous ALCL than LyP (picture 4A-D)) ?Erythematous, scaly patches and plaques (suggestive of mycosis fungoides (picture 6A-D)) ?Lymph node enlargement ?Hepatic or splenic enlargement Additional laboratory investigations that may be helpful to exclude a malignant lymphoma include: ?Complete blood cell count and differential ?Examination of a peripheral blood smear for the presence of atypical cells (absent in LyP) ?Routine biochemistry tests, including lactate dehydrogenase (LDH) ?Human T-lymphotropic virus I (HTLV-I) serology in endemic areas to exclude adult T cell leukemia-lymphoma (see "Human T-lymphotropic virus type I: Virology, pathogenesis, and epidemiology", section on 'Epidemiology and transmission') Imaging studies, including chest radiograph, ultrasonography of abdomen and pelvis, or computed tomography scan, are considered optional examinations in patients with typical LyP, normal laboratory tests, and absence of palpable enlarged lymph nodes, hepatosplenomegaly, and systemic symptoms [77]. However, a chest radiograph at presentation may be helpful to exclude a mediastinal mass from occult  Hodgkin or non-Hodgkin lymphoma. A chest radiograph also may be warranted for patients who will be treated with methotrexate because pre-existing lung disease may be a risk factor for lung toxicity. (See "Major side effects of low-dose methotrexate" and "Methotrexate-induced lung injury".) A liver biopsy should be considered in patients with abnormal liver function tests and/or history of excess alcohol use, hepatitis, or diabetes mellitus. DIFFERENTIAL DIAGNOSIS The differential diagnosis of lymphomatoid papulosis (LyP) includes other lymphoproliferative diseases expressing CD30 and several inflammatory and reactive disorders that contain CD30+ cells and mimic LyP clinically and histologically [54]. The clinicopathologic characteristics of the main disorders that should be differentiated from LyP are summarized in the table (table 1). CD30+ lymphoproliferative diseases ?Primary cutaneous anaplastic large cell lymphoma - Primary cutaneous anaplastic large cell lymphoma (PC-ALCL) usually manifests as a solitary, firm, large, and sometimes ulcerated nodule (picture 4C). Approximately 20 percent of patients have multifocal disease (picture 4A-B). CD30 is expressed by at least 75 percent of the large atypical T cells, and clonal TCR gene rearrangement is found in 90 percent of cases. Unlike systemic ALCL, primary cutaneous ALCL usually lacks expression of epithelial membrane antigen (EMA) and anaplastic lymphoma kinase (ALK). However, an ALK protein variant localized to the cytoplasm and distinct from the nuclear/cytoplasmic variant resulting from  the nucleophosmin (NPM):ALK translocation has been detected in a few cases of primary cutaneous ALCL, some of which also expressed EMA [78-82]. When NPM-ALK is detected as a nuclear/cytoplasmic stain, a thorough investigation should be done to exclude nodal systemic ALCL [82,83]. ALK protein is essentially absent in LyP [84]. If an ALK gene rearrangement is detected,  secondary skin lesions of systemic ALCL should be suspected. Cutaneous presentations of ALK-positive systemic ALCL associated with regional lymphadenopathy have been reported in children following insect bites [85,86]. (See "Primary cutaneous anaplastic large cell lymphoma".) ?Secondary skin lesions of systemic anaplastic large cell lymphoma - Most systemic anaplastic large cell lymphomas (ALCLs) carry the t(2;5)(p23;q35) translocation and express ALK, which is absent in LyP. (See "Clinical manifestations, pathologic features, and diagnosis of systemic anaplastic large cell lymphoma", section on 'Primary cutaneous ALCL'.) ?Transformed mycosis fungoides and Szary syndrome - Approximately one-half of transformed mycosis fungoides (MF) and Szary syndrome (SS) are CD30+. A previous history of patch or plaque MF or erythroderma clarifies the diagnosis, although LyP lesions may be coexistent with MF [25,87]. LyP type B is difficult to distinguish histologically from papular MF, in which lesions persist and may eventually become associated with typical patches and plaques [88,89]. (See "Staging and prognosis of mycosis fungoides and Szary syndrome", section on 'Transformation to large-cell histology (LCT)'.) ?Adult T cell leukemia-lymphoma - Adult T cell leukemia-lymphoma (ATLL) is a peripheral T cell leukemia-lymphoma caused by the human retrovirus HTLV-1. Most cases have an acute onset with generalized lymphadenopathy. All patients with ATLL have serologic antibodies to HTLV-1. Cutaneous involvement occurs in approximately 50 percent of cases. Large blast-like cells can be CD30+ but are ALK-. (See "Clinical manifestations, pathologic features, and diagnosis of adult T cell leukemia-lymphoma".) ?Hodgkin lymphoma - Skin involvement is rare in Hodgkin lymphoma, and primary cutaneous forms are even rarer (picture 4D). The Reed-Sternberg cells of Hodgkin lymphoma are CD30+ and CD15+. The latter is less frequently  expressed in LyP. (See "Hodgkin lymphoma: Epidemiology and risk factors".) Other lymphoid neoplasms that can have CD30-expressing neoplastic T cells may be confused with LyP in the absence of proper clinical context: ?Gamma/delta T cell lymphomas commonly present as ulcerating tumors in the extremities (usually legs) of adults. Lesions are larger than in LyP and ulcers often expose subcutaneous fat not seen in LyP [90]. (See "Clinical manifestations, pathologic features, and diagnosis of subcutaneous panniculitis-like T cell lymphoma", section on 'Primary cutaneous gamma/delta T cell lymphoma'.) ?Subcutaneous panniculitis-like T cell lymphoma (SPTCL) presents as palpable cutaneous nodules in children and adults but rarely ulcerates as in LyP [91]. Ringing of fat cells by tumor cells characteristic of SPTCL is infrequently seen in CD30+ cutaneous ALCL but rarely in LyP in which the CD30+ cells are usually confined to the dermis. (See "Clinical manifestations, pathologic features, and diagnosis of subcutaneous panniculitis-like T cell lymphoma".) ?Hydroa vacciniforme-like lymphoma is an Epstein-Barr virus (EBV)-driven lymphoproliferative disease affecting children and adolescents from [place], [place] and [place], and [place] [92,93]. Clinical features include facial edema and vesicles, small necrotic areas, crusts, and pitted scars involving the face and sun-exposed areas (picture 7). EBV is detected in CD30+ cells but is absent in LyP. ?Type E LyP can mimic angiocentric lesions of gamma-delta T cell lymphoma and NK/T cell lymphoma [94]. (See "Clinical manifestations, pathologic features, and diagnosis of extranodal NK/T cell lymphoma, nasal type".) ?Peripheral T cell lymphoma, not otherwise specified (PTCL, NOS) infrequently presents as a primary skin tumor in absence of lymphadenopathy [95,96]. Lesions contain sheets of tumor cells of varied size  without inflammatory cells, whereas inflammatory cells are  characteristic of LyP. Whereas LyP and PC-ALCL typically show disturbed expression of the T cell receptor/CD3 complex (CD3-gamma, -delta, -epsilon, -zeta) and associated signaling molecules (Lck, ZAP-70, LAT, bcl-10, Carma1, NFATc1, c-Jun, c-Fos, Syk), the full expression program required for T cell receptor signaling is retained in PTCL, NOS, facilitating their differential diagnosis [97]. (See "Clinical manifestations, pathologic features, and diagnosis of peripheral T cell lymphoma, not otherwise specified".) CD30+ inflammatory and infectious diseases -- Several inflammatory and reactive disorders may contain a significant number of CD30+ cells and mimic LyP clinically or histologically [54,98]. However, the reactive or inflammatory disorders are less likely to have dominant T cell clones, especially identical clones in multiple biopsies. These disorders, sometimes referred to as CD30+ pseudolymphomas, include: ?Pityriasis lichenoides - Pityriasis lichenoides et varioliformis acuta (PLEVA) is a skin disease of unknown etiology clinically characterized by the eruption of crops of papules that evolve to necrotic lesions and eventually resolve leaving varioliform scars (picture 8A-B). LyP and PLEVA may have overlapping clinical, histopathologic, and molecular features [99]. With few exceptions [99], the lymphocytic infiltrate in PLEVA is predominantly CD8+ with few large atypical CD30+ cells. A dominant T cell clone was detected in 13 of 20 cases (65 percent) in one study [100]. Pityriasis lichenoides chronica (PLC) may be more difficult to distinguish from LyP because the predominant lymphoid infiltrate can be CD4+. In children, the two entities may coexist [23]. (See "Pityriasis lichenoides et varioliformis acuta (PLEVA)" and "Pityriasis lichenoides chronica".) ?Reactions to arthropod bites/nodular scabies - Chronic reactions to insect bites and nodular scabies may simulate LyP clinically and histologically  (picture 9A-C). The dermis contains a dense inflammatory infiltrate of lymphoid cells and histiocytes, with an admixture of eosinophils and plasma cells and atypical mononuclear cells with hyperchromatic nuclei. A history of exposure and the presence of symptoms (eg, intense pruritus) may help in differentiating arthropod bites from LyP. (See "Scabies: Epidemiology, clinical features, and diagnosis", section on 'Classic scabies'.) ?Lymphomatoid drug eruption - A lymphomatoid eruption is a rare type of drug reaction clinically characterized by indurated papules or plaques (picture 10). Cases associated with the presence of large atypical CD30+ T cells on histologic examination have been reported following the administration of antibiotics, antiepileptics, or biologics [98,101-104]. The temporal relationship between the drug administration and skin eruptions and the disappearance of the rash after the drug suspension clarifies the diagnosis. (See "Cutaneous T cell pseudolymphomas", section on 'Lymphomatoid drug reaction'.) ?Viral infection - Cutaneous viral infections (orf and milker's nodule viruses, herpes simplex, varicella-zoster, molluscum contagiosum) may show large atypical CD30+ cells on histologic examination [98,105]. In contrast to LyP, skin lesions caused by viruses do not have a waxing and waning clinical course and do not show T-cell-receptor (TCR) gene rearrangement on molecular genetic studies. ?Other - CD30+ T cells may also be found in skin lesions of atopic dermatitis [106-109], mycobacterial infection [110], syphilis, and leishmaniasis [98]. The history and clinical course are usually sufficient to differentiate these conditions from LyP. INDICATIONS FOR REFERRAL If physical examination, blood tests, or imaging studies suggest extracutaneous lymphoproliferative disease associated with LyP, the patient should be referred to a hematologist/oncologist for appropriate diagnostic evaluation,  staging, and treatment of possible malignancy. TREATMENT  General considerations -- 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LyP) has a chronic course of years to decades of recurrent papulonodular lesions that undergo spontaneous regression after weeks or months. Treatment may hasten lesion healing and reduce the severity or prevent the eruption of new crops of lesions. However, none of  the available treatment modalities alters the natural history of LyP or reduces the risk of developing an associated lymphoma. Thus, the short-term benefits of treatment should be weighed against potentially harmful side effects in the individual patient [77,111]. Patients with limited or asymptomatic disease -- For patients who have few asymptomatic lesions without scarring or other cosmetic concerns (eg, lesions not on the face or hands), a wait-and-see strategy is appropriate, depending upon patient preference [77]. If the patient desires treatment in an attempt to minimize the lesions or facilitate regression, super-high potency topical corticosteroids (group 1 (table 2)) are an option. Skin lesions >2 cm that do not regress spontaneously in eight weeks should be biopsied and sent for pathology to exclude primary cutaneous anaplastic large cell lymphoma (PC-ALCL) [111]. (See "Clinical manifestations, pathologic features, and diagnosis of systemic anaplastic large cell lymphoma".) Patients with extensive or symptomatic disease -- Observation may be acceptable in patients who do not wish treatment. Some patients may use topical corticosteroids to alleviate symptoms alone or in conjunction with other treatment options listed below. Methotrexate -- We suggest low-dose methotrexate (5 to 35 mg per week by oral or subcutaneous administration) as the initial therapy for adult patients with symptomatic or extensive disease or disease involving cosmetically sensitive areas (eg, face or hands) [77]. Treatment is usually started at 5 to 10 mg per  week and increased by 2.5 to 5 mg per week or as tolerated to a maximum of 25 to 35 mg per week if a clinical improvement is not observed in four to eight weeks. Patients receiving weekly methotrexate should also receive folic acid 1 mg per day orally. A complete response is indicated by the clearance of all the active lesions without the development of new lesions. A partial response is defined as the regression of at least 50 percent of the active lesions and development of fewer new lesions [77]. Maintenance treatment is often required for long-term control of disease in a majority of patients. Maintenance therapy is usually initiated after complete response; the dose is gradually decreased by 2.5 or 5 mg per week to the lowest effective dose. The duration of treatment and maintenance treatment is based upon clinical response, occurrence of side effects, and patient's preference. The time to relapse after discontinuation of methotrexate is variable and should be assessed in the individual patient. Efficacy -- The efficacy of methotrexate for LyP has not been evaluated in randomized trials. Its use is based upon evidence of efficacy from small observational studies and expert consensus [77,112]: ?In a retrospective study including 45 patients with LyP treated with methotrexate 15 to 25 mg weekly, 20 patients (44 percent) did not develop new lesions and 19 patients (42 percent) developed only a few lesions during the treatment [113]. After discontinuation of therapy, 10 of 40 patients did not relapse during a follow-up period of 24 to 227 months. Patients with relapsing disease required maintenance treatment for months or years. Side effects were reported in 77 percent of patients, including hepatic fibrosis in 5 of 10 patients treated with methotrexate for more than three years. ?In another study, 55 adult patients with extensive papulonecrotic and nodular skin lesions or cosmetically concerning lesions were  treated with oral methotrexate 5 to 25 mg per week (median dose 10 mg per week) for 1 to 216 months (median 37 months) [114]. Excellent or good disease control was achieved in 24 patients in three to four weeks. Transient elevation of liver enzymes occurred in 10 patients during the first month  of treatment; two patients discontinued treatment because of persistent elevated liver enzymes. ?In another study, 25 patients with LyP were treated with oral methotrexate 20 to 30 mg per week for a minimum of six months, followed by a two- to six-month weaning period [115]. Twenty-two patients had a partial or complete response, but only 6 were successfully weaned off the drug and maintained the response for six months or longer, whereas 16 remained methotrexate dependent. Contraindications and adverse effects -- Important contraindications to methotrexate therapy include liver disease, renal disease, and pregnancy or planned pregnancy (for both females and males) [116,117]. Before initiating long-term methotrexate therapy, screening for hepatitis B and hepatitis C virus infection is recommended. In patients with infection, this allows a decision whether to avoid the use of methotrexate, to try to eradicate the viral infection before initiating therapy, or to suppress viral replication during immunosuppressive therapy. Minor adverse effects, including nausea, stomach upset, headache, and fatigue, occur in most patients treated with low-dose methotrexate. Hepatotoxicity, pulmonary fibrosis, and myelosuppression are serious adverse effects of methotrexate treatment and may infrequently occur with low-dose therapy. (See "Major side effects of low-dose methotrexate".) Monitoring -- Serum aminotransferases and peripheral blood cell count should be obtained twice monthly in the first month, then every 4 to 12 weeks, to detect hepatic and hematologic toxicity during long-term treatment with methotrexate. Blood should be drawn  on the day before the once-weekly methotrexate dose is administered for accurate assessment. Guidelines for dose adjustment for patients with abnormal liver function tests are presented separately. (See "Hepatotoxicity associated with chronic low-dose methotrexate for nonmalignant disease".) Phototherapy -- We suggest psoralen and ultraviolet A (PUVA) therapy for patients with LyP whose disease does not respond to methotrexate or patients for whom methotrexate is contraindicated. PUVA is administered twice weekly for six to eight weeks or until clearance [111]. Maintenance therapy with one treatment session per week may be needed to prevent relapses. (See "Psoralen plus ultraviolet A (PUVA) photochemotherapy", section on 'Treatment protocols'.) The efficacy of PUVA for the treatment of LyP has not been evaluated in clinical trials. Its use is supported by a few case series and evidence of efficacy in other types of cutaneous lymphoproliferative disorders (eg, mycosis fungoides) [4,111]. In a review of 19 patients treated with PUVA, a complete response was reported in 5 and a partial response in 13 [77]. Bath PUVA has been used for treatment of LyP in children [118-120]. Narrowband ultraviolet B (NBUVB) phototherapy or, if available, ultraviolet A1 (UVA1; long-wave [340 to 400 nm] ultraviolet A [UVA]) phototherapy, which do not require the oral or topical administration of psoralens, may be alternatives to PUVA. In one study, UVA1 phototherapy induced a complete response in five of seven patients [121]. Three patients relapsed between 1 and 20 months after discontinuation but responded to a second treatment cycle. Other therapies -- Aggressive treatment of LyP with multiagent chemotherapy is inappropriate. In patients with associated lymphomas, treatment of lymphoma with multiagent chemotherapy does not alter the clinical course of LyP [122,123]. Therapies that seemed to be effective when used alone or in  combination in small case series or in single patients include: ?Photodynamic therapy [124] ?Targeted phototherapy [125] ?Oral or topical retinoids (bexarotene) [126] ?Topical mechlorethamine and carmustine [127] ?Anti-CD30 monoclonal antibody-drug-conjugate (brentuximab vedotin) [128,129] ?Low-dose methotrexate plus interferon-alfa-2b in LyP with Th-17 phenotype [51] Treatment in children -- There is no consensus on treatment of LyP in children. In case series, no treatment or treatment with topical corticosteroids, oral antibiotics (macrolides or tetracyclines), ultraviolet B (  UVB), or natural sunlight have been reported [23,53,130-132]. Low-dose methotrexate and bath PUVA are additional therapeutic options [118,119,133]. For children with limited disease (few or small lesions), treatment is usually not necessary. When children require treatment because of cosmetic concerns or peer group pressure, we suggest topical corticosteroids or NBUVB phototherapy. Narrowband phototherapy is administered two to three times per week for six to eight weeks. Low-dose methotrexate (2.5 to 15 mg per week) may be an alternative for cases that do not respond to topical corticosteroids or NBUVB phototherapy. PROGNOSIS Despite the increased risk of developing a cutaneous or nodal malignant lymphoma, the prognosis of lymphomatoid papulosis (LyP) is excellent in the vast majority of cases [134]. Patients with LyP who do not develop malignancy have a normal life expectancy. The reported mortality rates from associated lymphomas are low [4,69,74]. In two case series, each with approximately 120 patients, associated lymphoid malignancy was detected in 19 and 14 percent before, after (up to 19 years), or concurrently with the development of LyP [4,74]. The mortality rate from associated lymphoma was approximately 2 percent in both series. In another study of 180 patients with LyP, 93 (52 percent) developed 114 hematologic  malignancies and six (6 percent) died of their disease [69]. Patients with LyP may have an increased risk of nonlymphoid malignancies [67]. LyP lesions may heal with scarring, especially in children. In small pediatric case series, varioliform scarring has been reported in approximately 80 percent of children [23,131]. FOLLOW-UP Patients with lymphomatoid papulosis (LyP) require long-term follow-up to monitor the disease course and response to treatment and lifelong surveillance for cutaneous or systemic lymphoma. Frequency of follow-up for those on treatment will depend on the specific treatment. For monitoring of disease course and surveillance, patients should be seen at 6- to 42-monthintervals for: ?Reassessment of clinical history ?Complete skin examination ?Physical examination focused on lymph node, hepatic, or splenic enlargement SOCIETY GUIDELINE LINKS Links to society and government-sponsored guidelines from selected countries and regions around the world are provided separately. (See "Society guideline links: Primary cutaneous lymphoma".) SUMMARY AND RECOMMENDATIONS  ?Definition and pathogenesis - 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LyP) is a rare, chronic recurrent, self-healing papulonodular skin eruption with histologic features of a CD30+ lymphoid proliferation of atypical T cells. The overexpression of CD30 on the large atypical T cell found in the skin lesions is the hallmark of LyP. Monoclonal rearrangement of the T cell receptor (TCR) genes has been detected in 40 to 100 percent of the LyP skin lesions. (See 'Introduction' above and 'Etiology and pathogenesis' above.) ?Clinical presentation - LyP manifests as a recurrent eruption of papules and nodules with central necrosis and crusting that spontaneously resolve in three to eight weeks, leaving hyperpigmentation or scarring (picture 3A-E). (See 'Clinical features' above.) ?Associated malignancies - Patients with LyP have an increased risk of  associated lymphoid malignancy (mycosis fungoides, anaplastic large cell lymphoma, B cell or Hodgkin lymphoma) before, concurrent with, or after the onset of LyP. (See 'Associated hematologic malignancies' above.) ?Diagnosis - The diagnosis of LyP is based upon the clinical appearance and course of the skin lesions and the evaluation of an excisional biopsy of a full-developed skin lesion. Histologic characteristics include a wedge-shaped inflammatory infiltrate containing large atypical CD30+ cells (picture 1A-B). Clonal rearrangement of the TCR genes is found in 40 to 100 percent of cases. A careful clinicopathologic correlation is essential for a correct diagnosis. (See 'Pathology' above and 'Diagnosis' above.) ?Additional evaluation - At the time of diagnosis, patients with LyP should be further evaluated to exclude  associated lymphoma. (See 'Further diagnostic evaluation' above.) ?Differential diagnosis - The differential diagnosis of LyP includes lymphoproliferative and inflammatory or reactive disorders that contain CD30+ cells and mimic LyP clinically and histologically (table 1). (See 'Differential diagnosis' above.) ?Treatment - None of the available treatment modalities alters the natural history of LyP or reduces the risk of developing an associated lymphoma. However, treatment may hasten lesion healing and reduce the severity or prevent the eruption of new crops of lesions (see 'General considerations' above): Limited disease - We suggest a wait-and-see strategy (observation) for patients who have limited or asymptomatic disease without scarring or other cosmetic concerns (eg, lesions not on the face or hands) (Grade 2C). (See 'Patients with limited or asymptomatic disease' above.) Extensive disease - For patients with extensive or symptomatic disease, scarring, or cosmetic concerns, we suggest low-dose methotrexate as the initial therapy (Grade 2C). The treatment is usually started at 5 to 10 mg  per week orally or subcutaneously and increased by 2.5 mg per week or as tolerated to a maximum of 25 mg per week if clinical improvement is not observed. For patients for whom methotrexate is contraindicated and for patients with LyP that does not respond to methotrexate, we suggest psoralen and ultraviolet A (PUVA) therapy (Grade 2C). PUVA is administered twice weekly for six to eight weeks or until clearance. (See 'Patients with extensive or symptomatic disease' above.) In children - For children with symptomatic lesions, scarring, or cosmetic concerns, we suggest topical corticosteroids or narrowband ultraviolet B (NBUVB) therapy (Grade 2C). Low-dose methotrexate (2.5 to 15 mg per week) may be an alternative for children who do not respond to topical steroids or ultraviolet B (UVB). (See 'Treatment in children' above.) ?Prognosis - The prognosis of LyP is excellent. However, because of the increased lifelong risk of developing a malignant lymphoma or other malignancy, patients with LyP require long-term follow-up. (See 'Prognosis' above and 'Follow-up' above.)        Mediterranean diet is good for you. ([employer] [facility] has a typical Mediterranean cuisine menu) The Mediterranean diet is a way of eating based on the traditional cuisine of countries bordering the [employer]. While there is no single definition of the Mediterranean diet, it is typically high in vegetables, fruits, whole grains, beans, nut and seeds, and olive oil. The main components of Mediterranean diet include: Daily consumption of vegetables, fruits, whole grains and healthy fats  Weekly intake of fish, poultry, beans and eggs  Moderate portions of dairy products  Limited intake of red meat Other important elements of the Mediterranean diet are sharing meals with family and friends, enjoying a glass of red wine and being physically active. Health benefits of a Mediterranean diet: A traditional Mediterranean diet  consisting of large quantities of fresh fruits and vegetables, nuts, fish and olive oil--coupled with physical activity--can reduce your risk of serious mental and physical health problems by: Preventing heart disease and strokes. Following a Mediterranean diet limits your intake of refined breads, processed foods, and red meat, and encourages drinking red wine instead of hard liquor--all factors that can help prevent heart disease and stroke. Keeping you agile. If you're an older adult, the nutrients gained with a Mediterranean diet may reduce your risk of developing muscle weakness and other signs of frailty by about 70 percent. Reducing the risk of Alzheimer's. Research suggests that the [place] diet may improve cholesterol, blood sugar levels, and overall blood vessel health, which in turn may reduce your risk of Alzheimer's disease or dementia. Halving the  risk of Parkinson's disease. The high levels of antioxidants in the Mediterranean diet can prevent cells from undergoing a damaging process called oxidative stress, thereby cutting the risk of Parkinson's disease in half. Increasing longevity. By reducing your risk of developing heart disease or cancer with the Mediterranean diet, you're reducing your risk of death at any age by 20%. Protecting against type 2 diabetes. A Mediterranean diet is rich in fiber which digests slowly, prevents huge swings in blood sugar, and can help you maintain a healthy weight.

## 2020-11-18 NOTE — Progress Notes (Signed)
Subjective:  Patient ID: Christine Duncan, female    DOB: 05-Jan-1981  Age: 40 y.o. 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 Take 1 by mouth in the evening     amphetamine-dextroamphetamine (ADDERALL) 20 MG tablet Take 20 mg by mouth daily as needed (to focus).   0   cetirizine (ZYRTEC) 10 MG tablet Take 10 mg by mouth daily.     clonazePAM (KLONOPIN) 0.5 MG disintegrating tablet Take 1 mg by mouth 3 (three) times daily as needed (for anxiety).     cyclobenzaprine (FLEXERIL) 5 MG tablet Take 1 tablet (5 mg total) by mouth 3 (three) times daily as needed for muscle spasms. 30 tablet 1   lamoTRIgine (LAMICTAL) 100 MG tablet Take 100 mg by mouth daily.     omeprazole (PRILOSEC) 40 MG capsule Take 1 capsule (40 mg total) by mouth daily. 90 capsule 3   temazepam (RESTORIL) 15 MG capsule Take 15 mg by mouth at bedtime.     Vitamin D, Ergocalciferol, (DRISDOL) 1.25 MG (50000 UNIT) CAPS capsule Take 1 capsule (50,000 Units total) by mouth every 7 (seven) days. 8 capsule 0   Cyanocobalamin (VITAMIN B-12) 5000 MCG SUBL 500 mcg sublingually daily 100 tablet 3   Cholecalciferol (VITAMIN D3) 50 MCG (2000 UT) capsule Take 1 capsule (2,000 Units total) by mouth daily. (Patient not taking: Reported on 11/18/2020) 100 capsule 3   Cyanocobalamin (VITAMIN B-12) 5000 MCG TBDP Place 1 tablet under the tongue daily. Place 1 under tongue daily     No facility-administered medications prior to visit.    ROS: Review of Systems  Constitutional:  Negative for activity change, appetite change, chills, fatigue and unexpected weight change.  HENT:  Negative for congestion, mouth sores and sinus pressure.   Eyes:  Negative for visual disturbance.  Respiratory:  Negative  for cough and chest tightness.   Gastrointestinal:  Negative for abdominal pain and nausea.  Genitourinary:  Negative for difficulty urinating, frequency and vaginal pain.  Musculoskeletal:  Negative for back pain and gait problem.  Skin:  Positive for rash. Negative for pallor.  Neurological:  Negative for dizziness, tremors, weakness, numbness and headaches.  Psychiatric/Behavioral:  Negative for confusion and sleep disturbance.    Objective:  BP 124/90 (BP Location: Left Arm)   Pulse 89   Temp 98.3 F (36.8 C) (Oral)   SpO2 97%   BP Readings from Last 3 Encounters:  11/18/20 124/90  11/10/20 130/88  10/25/20 128/84    Wt Readings from Last 3 Encounters:  11/10/20 159 lb 12.8 oz (72.5 kg)  10/25/20 158 lb (71.7 kg)  10/15/20 160 lb (72.6 kg)    Physical Exam Constitutional:      General: She is not in acute distress.    Appearance: She is well-developed.  HENT:     Head: Normocephalic.     Right Ear: External ear normal.     Left Ear: External ear normal.     Nose: Nose normal.  Eyes:     General:        Right eye: No discharge.        Left eye: No discharge.     Conjunctiva/sclera: Conjunctivae normal.     Pupils: Pupils are  equal, round, and reactive to light.  Neck:     Thyroid: No thyromegaly.     Vascular: No JVD.     Trachea: No tracheal deviation.  Cardiovascular:     Rate and Rhythm: Normal rate and regular rhythm.     Heart sounds: Normal heart sounds.  Pulmonary:     Effort: No respiratory distress.     Breath sounds: No stridor. No wheezing.  Abdominal:     General: Bowel sounds are normal. There is no distension.     Palpations: Abdomen is soft. There is no mass.     Tenderness: There is no abdominal tenderness. There is no guarding or rebound.  Musculoskeletal:        General: No tenderness.     Cervical back: Normal range of motion and neck supple. No rigidity.  Lymphadenopathy:     Cervical: No cervical adenopathy.  Skin:    Findings:  Rash present. No erythema.  Neurological:     Cranial Nerves: No cranial nerve deficit.     Motor: No abnormal muscle tone.     Coordination: Coordination normal.     Deep Tendon Reflexes: Reflexes normal.  Psychiatric:        Behavior: Behavior normal.        Thought Content: Thought content normal.        Judgment: Judgment normal.  Rash elements-unchanged from our previous visit  Lab Results  Component Value Date   WBC 8.9 11/10/2020   HGB 13.4 11/10/2020   HCT 39.9 11/10/2020   PLT 313.0 11/10/2020   GLUCOSE 84 11/10/2020   CHOL 262 (H) 11/10/2020   TRIG 115.0 11/10/2020   HDL 66.90 11/10/2020   LDLCALC 172 (H) 11/10/2020   ALT 12 11/10/2020   AST 14 11/10/2020   NA 138 11/10/2020   K 3.6 11/10/2020   CL 103 11/10/2020   CREATININE 0.73 11/10/2020   BUN 9 11/10/2020   CO2 28 11/10/2020   TSH 0.43 11/10/2020   HGBA1C 5.5 04/03/2017    No results found.  Assessment & Plan:   Problem List Items Addressed This Visit     B12 deficiency    Worse.  The patient is willing to give herself subcutaneous shots.  Start B12 sq -  1000 mcg of Vitamin b12 sq daily x 1 week, then weekly for 1 month, then once every 2 weeks. She will try gluten-free diet       Relevant Orders   Ambulatory referral to Hematology / Oncology   IBS    Try gluten-free diet for 4-6 weeks      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HCC) - Primary    Skin rash -will use  clobetasol as needed.  Status post skin biopsy.  Skin biopsy is consistent with lymphoid papulosis.  The exact implications are unclear.  It is a complex case. Will obtain a hematology consult.      Relevant Orders   Ambulatory referral to Hematology / Oncology      Meds ordered this encounter  Medications   cyanocobalamin ((VITAMIN B-12)) injection 1,000 mcg   SYRINGE-NEEDLE, DISP, 3 ML (BD ECLIPSE SYRINGE) 25G X 1" 3 ML MISC    Sig: Give 1000 mcg of Vitamin b12 sq daily x 1 week, then weekly for 1 month, then once every 2 weeks     Dispense:  50 each    Refill:  3   cyanocobalamin (,VITAMIN B-12,) 1000 MCG/ML injection    Sig: Give 1000 mcg of Vitamin b12  sq daily x 1 week, then weekly for 1 month, then once every 2 weeks    Dispense:  10 mL    Refill:  6   clobetasol cream (TEMOVATE) 0.05 %    Sig: Apply 1 application topically 2 (two) times daily.    Dispense:  60 g    Refill:  3      Follow-up: Return in about 3 months (around 02/18/2021).  Walker Kehr, MD

## 2020-11-18 NOTE — Assessment & Plan Note (Addendum)
Skin rash -will use  clobetasol as needed.  Status post skin biopsy.  Skin biopsy is consistent with lymphoid papulosis.  The exact implications are unclear.  It is a complex case. Will obtain a hematology consult.

## 2020-11-18 NOTE — Assessment & Plan Note (Addendum)
Worse.  The patient is willing to give herself subcutaneous shots.  Start B12 sq -  1000 mcg of Vitamin b12 sq daily x 1 week, then weekly for 1 month, then once every 2 weeks. She will try gluten-free diet

## 2020-11-20 NOTE — Assessment & Plan Note (Signed)
Try gluten free diet  for 4-6 weeks ?

## 2020-12-01 ENCOUNTER — Inpatient Hospital Stay: Payer: BC Managed Care – PPO | Admitting: Hematology & Oncology

## 2020-12-01 ENCOUNTER — Inpatient Hospital Stay: Payer: BC Managed Care – PPO

## 2020-12-07 ENCOUNTER — Other Ambulatory Visit (HOSPITAL_COMMUNITY): Payer: Self-pay

## 2020-12-07 ENCOUNTER — Encounter: Payer: Self-pay | Admitting: Hematology & Oncology

## 2020-12-07 ENCOUNTER — Inpatient Hospital Stay: Payer: BC Managed Care – PPO | Attending: Hematology & Oncology

## 2020-12-07 ENCOUNTER — Other Ambulatory Visit: Payer: Self-pay

## 2020-12-07 ENCOUNTER — Inpatient Hospital Stay: Payer: BC Managed Care – PPO | Admitting: Hematology & Oncology

## 2020-12-07 ENCOUNTER — Ambulatory Visit (HOSPITAL_BASED_OUTPATIENT_CLINIC_OR_DEPARTMENT_OTHER)
Admission: RE | Admit: 2020-12-07 | Discharge: 2020-12-07 | Disposition: A | Discharge status: Home / Self Care | Payer: BC Managed Care – PPO | Source: Ambulatory Visit | Attending: Hematology & Oncology | Admitting: Hematology & Oncology

## 2020-12-07 ENCOUNTER — Telehealth: Payer: Self-pay

## 2020-12-07 VITALS — BP 142/88 | HR 94 | Temp 98.3°F | Resp 16 | Ht 68.5 in | Wt 159.0 lb

## 2020-12-07 DIAGNOSIS — C866 Primary cutaneous CD30-positive T-cell proliferations: Secondary | ICD-10-CM | POA: Insufficient documentation

## 2020-12-07 DIAGNOSIS — E538 Deficiency of other specified B group vitamins: Secondary | ICD-10-CM | POA: Diagnosis not present

## 2020-12-07 DIAGNOSIS — Z7189 Other specified counseling: Secondary | ICD-10-CM | POA: Diagnosis not present

## 2020-12-07 DIAGNOSIS — Z0389 Encounter for observation for other suspected diseases and conditions ruled out: Secondary | ICD-10-CM | POA: Diagnosis not present

## 2020-12-07 HISTORY — DX: Other specified counseling: Z71.89

## 2020-12-07 LAB — CBC WITH DIFFERENTIAL (CANCER CENTER ONLY)
Abs Immature Granulocytes: 0.02 10*3/uL (ref 0.00–0.07)
Basophils Absolute: 0.1 10*3/uL (ref 0.0–0.1)
Basophils Relative: 1 %
Eosinophils Absolute: 0 10*3/uL (ref 0.0–0.5)
Eosinophils Relative: 0 %
HCT: 39.8 % (ref 36.0–46.0)
Hemoglobin: 13.6 g/dL (ref 12.0–15.0)
Immature Granulocytes: 0 %
Lymphocytes Relative: 26 %
Lymphs Abs: 1.8 10*3/uL (ref 0.7–4.0)
MCH: 29.2 pg (ref 26.0–34.0)
MCHC: 34.2 g/dL (ref 30.0–36.0)
MCV: 85.6 fL (ref 80.0–100.0)
Monocytes Absolute: 0.5 10*3/uL (ref 0.1–1.0)
Monocytes Relative: 7 %
Neutro Abs: 4.5 10*3/uL (ref 1.7–7.7)
Neutrophils Relative %: 66 %
Platelet Count: 330 10*3/uL (ref 150–400)
RBC: 4.65 MIL/uL (ref 3.87–5.11)
RDW: 12.7 % (ref 11.5–15.5)
WBC Count: 6.9 10*3/uL (ref 4.0–10.5)
nRBC: 0 % (ref 0.0–0.2)

## 2020-12-07 LAB — CMP (CANCER CENTER ONLY)
ALT: 14 U/L (ref 0–44)
AST: 14 U/L — ABNORMAL LOW (ref 15–41)
Albumin: 4.5 g/dL (ref 3.5–5.0)
Alkaline Phosphatase: 57 U/L (ref 38–126)
Anion gap: 8 (ref 5–15)
BUN: 10 mg/dL (ref 6–20)
CO2: 27 mmol/L (ref 22–32)
Calcium: 9.6 mg/dL (ref 8.9–10.3)
Chloride: 102 mmol/L (ref 98–111)
Creatinine: 0.8 mg/dL (ref 0.44–1.00)
GFR, Estimated: >60 mL/min (ref >60)
Glucose, Bld: 103 mg/dL — ABNORMAL HIGH (ref 70–99)
Potassium: 3.7 mmol/L (ref 3.5–5.1)
Sodium: 137 mmol/L (ref 135–145)
Total Bilirubin: 0.4 mg/dL (ref 0.3–1.2)
Total Protein: 7.1 g/dL (ref 6.5–8.1)

## 2020-12-07 LAB — SAVE SMEAR(SSMR), FOR PROVIDER SLIDE REVIEW

## 2020-12-07 LAB — VITAMIN B12: Vitamin B-12: 738 pg/mL (ref 180–914)

## 2020-12-07 LAB — LACTATE DEHYDROGENASE: LDH: 178 U/L (ref 98–192)

## 2020-12-07 MED ORDER — METHOTREXATE 2.5 MG PO TABS
10.0000 mg | ORAL_TABLET | ORAL | 2 refills | Status: DC
  Filled 2020-12-07: qty 16, 28d supply, fill #0
  Filled 2021-01-04: qty 16, 28d supply, fill #1
  Filled 2021-01-31: qty 16, 28d supply, fill #2

## 2020-12-07 NOTE — Progress Notes (Signed)
Referral MD  Reason for Referral: Lymphomatoid papulosis-extensive  Chief Complaint  Patient presents with   New Patient (Initial Visit)  : I have skin lesions.  HPI: [name] is a very charming 40 year old white female.  She has history of fibromyalgia.  She has history of irritable bowel syndrome and spastic colon.  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  She is incredibly fun to talk to.  She has been quite healthy.  She is followed by Dr. Alain Marion.  Recently, she began to have these macular-papular lesions pop up on her skin.  They were somewhat painful.  They seem to respond maybe a little bit to some topical steroid.  However, she began to have more.  There are on her neck, arms, inguinal area, back of the legs.  She saw the wonderful Dr. Pearline Cables of dermatology.  He did some biopsies.  I saw the pathology report on her cell phone.  The pathology report showed lymphomatoid papulosis.  The lymphoid cells were CD3 positive.  I think she was put on a vitamin A derivative.  Based on this, she was kindly referred to the [facility] for an evaluation.  She used to smoke.  She now vapes.  She has 2 children.  They are delivered by C-section.  She has had an appendectomy.  No one else in the family has any kind of skin lesions.  There is a history of cancer in the family.  She has had no weight loss or weight gain.  There is no change in bowel or bladder habits.  She is yet to have a mammogram.  Overall, I would say her performance status is ECOG 0.    Past Medical History:  Diagnosis Date   Acid reflux    Allergy    Anemia    Anxiety    B12 deficiency    Bipolar 1 disorder (HCC)    Blood type, Rh negative    Cervical dysplasia    Depression    Essential tremor    Fibromyalgia    Goals of care, counseling/discussion 12/07/2020   Hives    IBS (irritable bowel syndrome)    Neuromuscular disorder (HCC)     fibromylagia   Spastic colon   :   Past Surgical History:  Procedure Laterality Date   CERVICAL BIOPSY  W/ LOOP ELECTRODE EXCISION     CESAREAN SECTION  2006   CESAREAN SECTION  2011   COLPOSCOPY     DILATION AND CURETTAGE OF UTERUS  2005   INTRAUTERINE DEVICE INSERTION  05/09/2016   LAPAROSCOPIC APPENDECTOMY N/A 08/19/2016   Procedure: APPENDECTOMY LAPAROSCOPIC;  Surgeon: Alphonsa Overall, MD;  Location: [facility];  Service: General;  Laterality: N/A;   LEEP  07/2010   CIN II, Margins free  :   Current Outpatient Medications:    acitretin (SORIATANE) 10 MG capsule, Take 10 mg by mouth daily. Take 1 by mouth in the evening, Disp: , Rfl:    amphetamine-dextroamphetamine (ADDERALL) 20 MG tablet, Take 20 mg by mouth daily as needed (to focus). , Disp: , Rfl: 0   cetirizine (ZYRTEC) 10 MG tablet, Take 10 mg by mouth daily., Disp: , Rfl:    Cholecalciferol (VITAMIN D3) 50 MCG (2000 UT) capsule, Take 1 capsule (2,000 Units total) by mouth daily. (Patient not taking: Reported on 11/18/2020), Disp: 100 capsule, Rfl: 3   clonazePAM (KLONOPIN) 0.5 MG disintegrating tablet, Take 1 mg by mouth 3 (  three) times daily as needed (for anxiety)., Disp: , Rfl:    cyanocobalamin (,VITAMIN B-12,) 1000 MCG/ML injection, Give 1000 mcg of Vitamin b12 sq daily x 1 week, then weekly for 1 month, then once every 2 weeks, Disp: 10 mL, Rfl: 6   cyclobenzaprine (FLEXERIL) 5 MG tablet, Take 1 tablet (5 mg total) by mouth 3 (three) times daily as needed for muscle spasms., Disp: 30 tablet, Rfl: 1   lamoTRIgine (LAMICTAL) 100 MG tablet, Take 100 mg by mouth daily., Disp: , Rfl:    omeprazole (PRILOSEC) 40 MG capsule, Take 1 capsule (40 mg total) by mouth daily., Disp: 90 capsule, Rfl: 3   SYRINGE-NEEDLE, DISP, 3 ML (BD ECLIPSE SYRINGE) 25G X 1" 3 ML MISC, Give 1000 mcg of Vitamin b12 sq daily x 1 week, then weekly for 1 month, then once every 2 weeks, Disp: 50 each, Rfl: 3   temazepam (RESTORIL) 15 MG capsule, Take 15 mg by  mouth at bedtime., Disp: , Rfl:    triamcinolone cream (KENALOG) 0.1 %, Apply topically 2 (two) times daily as needed., Disp: , Rfl:    Vitamin D, Ergocalciferol, (DRISDOL) 1.25 MG (50000 UNIT) CAPS capsule, Take 1 capsule (50,000 Units total) by mouth every 7 (seven) days., Disp: 8 capsule, Rfl: 0:  :   Allergies  Allergen Reactions   Carbidopa-Levodopa Nausea And Vomiting   Sertraline Hcl Other (See Comments)    Reaction:  Manic episode  :   Family History  Problem Relation Age of Onset   Thyroid disease Mother    Depression Mother    Cancer Father 68       esoph ca   Esophageal cancer Father    Hyperlipidemia Paternal Grandmother    Diabetes Paternal Grandmother    Breast cancer Paternal Grandmother 45   Cancer Paternal Grandmother        Bladder and colon cancer   Colon cancer Paternal Grandmother    Bipolar disorder Paternal Grandmother    Stroke Paternal Grandmother    Seizures Maternal Grandmother    Thyroid disease Maternal Grandmother    Depression Maternal Grandmother    Diabetes Maternal Grandmother    Bipolar disorder Maternal Grandfather    Alcohol abuse Maternal Grandfather    Drug abuse Maternal Grandfather    Allergic rhinitis Brother    Asthma Paternal Aunt    Allergic rhinitis Paternal Aunt    Diabetes Paternal Grandfather    Rectal cancer Neg Hx    Stomach cancer Neg Hx   :   Social History   Socioeconomic History   Marital status: Married    Spouse name: Not on file   Number of children: Not on file   Years of education: Not on file   Highest education level: Not on file  Occupational History   Occupation: WORKS [employer]    Employer: [employer]    Comment: [employer]  Tobacco Use   Smoking status: Former    Packs/day: 1.00    Types: E-cigarettes, Cigarettes   Smokeless tobacco: Never   Tobacco comments:    Pt Vapes everyday.  Vaping Use   Vaping Use: Every day   Substances: Nicotine, Flavoring  Substance and Sexual Activity    Alcohol use: Yes    Alcohol/week: 0.0 standard drinks    Comment: Rare   Drug use: No   Sexual activity: Yes    Birth control/protection: Condom    Comment: 1st intercourse 40 yo-Fewer than 5 partners Mirena 05/09/2016   Other Topics  Concern   Not on file  Social History Narrative   Regular exercise- yes   Social Determinants of Health   Financial Resource Strain: Not on file  Food Insecurity: Not on file  Transportation Needs: Not on file  Physical Activity: Not on file  Stress: Not on file  Social Connections: Not on file  Intimate Partner Violence: Not on file  :  Review of Systems  Constitutional: Negative.   HENT: Negative.    Eyes: Negative.   Respiratory: Negative.    Cardiovascular: Negative.   Gastrointestinal: Negative.   Genitourinary: Negative.   Musculoskeletal: Negative.   Skin:  Positive for rash.  Neurological: Negative.   Endo/Heme/Allergies: Negative.   Psychiatric/Behavioral: Negative.      Exam: @IPVITALS @ This is a well-developed and well-nourished white female in no obvious distress.  Vital signs show temperature of 98.3.  Pulse 94.  Blood pressure 142/88.  Weight is 159 pounds.  Head and neck exam shows no ocular or oral lesions.  She has no palpable cervical or supraclavicular lymph nodes.  Lungs are clear bilaterally.  Cardiac exam regular rate and rhythm with no murmurs, rubs or bruits.  Abdomen is soft.  She has good bowel sounds.  There is no fluid wave.  There is no palpable liver or spleen tip.  Back exam shows no tenderness over the spine, ribs or hips.  Extremities shows no clubbing, cyanosis or edema.  She has good range of motion of her joints.  Skin exam does show these papular lesions.  There are small.  Some have central eschars.  These lesions are mostly 3-4 mm.  They are nontender.  There is somewhat red.  Neurological exam shows no focal neurological deficits.   Recent Labs    12/07/20 1106  WBC 6.9  HGB 13.6  HCT 39.8  PLT 330     Recent Labs    12/07/20 1106  NA 137  K 3.7  CL 102  CO2 27  GLUCOSE 103*  BUN 10  CREATININE 0.80  CALCIUM 9.6    Blood smear review: None  Pathology: Formal pathology report is pending    Assessment and Plan: [name] is a very charming 40 year old white female.  She has a diagnosis of lymphoid papulosis.  This is quite uncommon.  This is in the spectrum of cutaneous T-cell lymphomas.  It is not an actual lymphoma but can certainly transform into this.  I would say that she has extensive disease just by the number of lesions on her body.  She is somewhat symptomatic.  Think we should probably try her on low-dose methotrexate.  I think this would be very reasonable.  I think we can try her on 10 mg a week.  I talked to her about this.  I will get a chest x-ray on her today just as a baseline.  Hopefully, we will be able to see a good response with methotrexate.  We could certainly increase the dose if necessary.  Again this is a very unusual problem.  Again this is not a true malignancy but is on the spectrum of cutaneous T-cell lymphomas.  I will like to see her back in 1 month.  By then, we should hopefully be seeing a response.

## 2020-12-07 NOTE — Telephone Encounter (Signed)
[facility] to be [facility] to [place]. Spoke with Sonia Baller who states she will fax it over.

## 2020-12-08 ENCOUNTER — Encounter: Payer: Self-pay | Admitting: *Deleted

## 2020-12-20 ENCOUNTER — Other Ambulatory Visit: Payer: Self-pay | Admitting: *Deleted

## 2020-12-20 ENCOUNTER — Encounter: Payer: Self-pay | Admitting: Hematology & Oncology

## 2020-12-20 MED ORDER — ONDANSETRON HCL 8 MG PO TABS: 8.0000 mg | ORAL_TABLET | Freq: Three times a day (TID) | ORAL | 0 refills | Status: DC | PRN

## 2021-01-04 ENCOUNTER — Other Ambulatory Visit (HOSPITAL_COMMUNITY): Payer: Self-pay

## 2021-01-06 ENCOUNTER — Inpatient Hospital Stay: Payer: BC Managed Care – PPO | Attending: Hematology & Oncology

## 2021-01-06 ENCOUNTER — Inpatient Hospital Stay: Payer: BC Managed Care – PPO | Admitting: Hematology & Oncology

## 2021-01-06 ENCOUNTER — Encounter: Payer: Self-pay | Admitting: Hematology & Oncology

## 2021-01-06 ENCOUNTER — Other Ambulatory Visit: Payer: Self-pay

## 2021-01-06 VITALS — BP 118/86 | HR 99 | Temp 99.1°F | Resp 16 | Wt 159.0 lb

## 2021-01-06 DIAGNOSIS — C866 Primary cutaneous CD30-positive T-cell proliferations: Secondary | ICD-10-CM | POA: Insufficient documentation

## 2021-01-06 DIAGNOSIS — Z79899 Other long term (current) drug therapy: Secondary | ICD-10-CM | POA: Diagnosis not present

## 2021-01-06 LAB — CMP (CANCER CENTER ONLY)
ALT: 18 U/L (ref 0–44)
AST: 17 U/L (ref 15–41)
Albumin: 4.2 g/dL (ref 3.5–5.0)
Alkaline Phosphatase: 52 U/L (ref 38–126)
Anion gap: 5 (ref 5–15)
BUN: 17 mg/dL (ref 6–20)
CO2: 28 mmol/L (ref 22–32)
Calcium: 9.4 mg/dL (ref 8.9–10.3)
Chloride: 105 mmol/L (ref 98–111)
Creatinine: 0.84 mg/dL (ref 0.44–1.00)
GFR, Estimated: >60 mL/min (ref >60)
Glucose, Bld: 101 mg/dL — ABNORMAL HIGH (ref 70–99)
Potassium: 4.2 mmol/L (ref 3.5–5.1)
Sodium: 138 mmol/L (ref 135–145)
Total Bilirubin: 0.4 mg/dL (ref 0.3–1.2)
Total Protein: 6.8 g/dL (ref 6.5–8.1)

## 2021-01-06 LAB — CBC WITH DIFFERENTIAL (CANCER CENTER ONLY)
Abs Immature Granulocytes: 0.01 10*3/uL (ref 0.00–0.07)
Basophils Absolute: 0.1 10*3/uL (ref 0.0–0.1)
Basophils Relative: 1 %
Eosinophils Absolute: 0 10*3/uL (ref 0.0–0.5)
Eosinophils Relative: 1 %
HCT: 36.2 % (ref 36.0–46.0)
Hemoglobin: 12.3 g/dL (ref 12.0–15.0)
Immature Granulocytes: 0 %
Lymphocytes Relative: 30 %
Lymphs Abs: 1.5 10*3/uL (ref 0.7–4.0)
MCH: 29.5 pg (ref 26.0–34.0)
MCHC: 34 g/dL (ref 30.0–36.0)
MCV: 86.8 fL (ref 80.0–100.0)
Monocytes Absolute: 0.3 10*3/uL (ref 0.1–1.0)
Monocytes Relative: 7 %
Neutro Abs: 3 10*3/uL (ref 1.7–7.7)
Neutrophils Relative %: 61 %
Platelet Count: 305 10*3/uL (ref 150–400)
RBC: 4.17 MIL/uL (ref 3.87–5.11)
RDW: 13.4 % (ref 11.5–15.5)
WBC Count: 4.9 10*3/uL (ref 4.0–10.5)
nRBC: 0 % (ref 0.0–0.2)

## 2021-01-06 LAB — HEPATITIS PANEL, ACUTE
HCV Ab: NONREACTIVE
Hep A IgM: NONREACTIVE
Hep B C IgM: NONREACTIVE
Hepatitis B Surface Ag: NONREACTIVE

## 2021-01-06 LAB — LACTATE DEHYDROGENASE: LDH: 169 U/L (ref 98–192)

## 2021-01-06 NOTE — Progress Notes (Signed)
Hematology and Oncology Follow Up Visit  MIRZA Duncan [id number] 1980/07/16 40 y.o. 01/06/2021   Principle Diagnosis:  Lymphomatoid papulosis  Current Therapy:   Methotrexate 10 mg p.o. weekly     Interim History:  [name] is back for follow-up.  This is her second office visit.  We first saw her back in November.  We put her on low-dose methotrexate at 10 mg a week.  She is tolerating this well.  She says that the lesions are drying up.  She says she does get some new lesions but that they do not last as long.  She has had no problems with the methotrexate.  She has had no nausea or vomiting.  There may be a little bit of nausea over the weekend that she takes each dose.  She has had no problems with her bowels or bladder.  Her monthly cycle will start soon.  She had a nice Thanksgiving.  She is going to have a very busy Christmas.  She is busy trying to find a car for her daughter who turned 28 years old.  She has had no fever.  She has had no cough.  She has had no headache..  She is still working.  She works at an assisted living facility.  I must say that she is very busy.  Overall, her performance status is ECOG 1.  Medications:  Current Outpatient Medications:    amphetamine-dextroamphetamine (ADDERALL) 20 MG tablet, Take 20 mg by mouth daily as needed (to focus). , Disp: , Rfl: 0   cetirizine (ZYRTEC) 10 MG tablet, Take 10 mg by mouth daily., Disp: , Rfl:    Cholecalciferol (VITAMIN D3) 50 MCG (2000 UT) capsule, Take 1 capsule (2,000 Units total) by mouth daily., Disp: 100 capsule, Rfl: 3   clonazePAM (KLONOPIN) 0.5 MG disintegrating tablet, Take 1 mg by mouth 3 (three) times daily as needed (for anxiety)., Disp: , Rfl:    cyanocobalamin (,VITAMIN B-12,) 1000 MCG/ML injection, Give 1000 mcg of Vitamin b12 sq daily x 1 week, then weekly for 1 month, then once every 2 weeks, Disp: 10 mL, Rfl: 6   cyclobenzaprine (FLEXERIL) 5 MG tablet, Take 1 tablet (5 mg total) by mouth 3  (three) times daily as needed for muscle spasms., Disp: 30 tablet, Rfl: 1   lamoTRIgine (LAMICTAL) 100 MG tablet, Take 100 mg by mouth daily., Disp: , Rfl:    methotrexate (RHEUMATREX) 2.5 MG tablet, Take 4 tablets (10 mg total) by mouth once a week. Caution:Chemotherapy. Protect from light., Disp: 16 tablet, Rfl: 2   omeprazole (PRILOSEC) 40 MG capsule, Take 1 capsule (40 mg total) by mouth daily., Disp: 90 capsule, Rfl: 3   ondansetron (ZOFRAN) 8 MG tablet, Take 1 tablet (8 mg total) by mouth every 8 (eight) hours as needed for nausea or vomiting., Disp: 30 tablet, Rfl: 0   SYRINGE-NEEDLE, DISP, 3 ML (BD ECLIPSE SYRINGE) 25G X 1" 3 ML MISC, Give 1000 mcg of Vitamin b12 sq daily x 1 week, then weekly for 1 month, then once every 2 weeks, Disp: 50 each, Rfl: 3   temazepam (RESTORIL) 15 MG capsule, Take 15 mg by mouth at bedtime., Disp: , Rfl:    triamcinolone cream (KENALOG) 0.1 %, Apply topically 2 (two) times daily as needed., Disp: , Rfl:    Vitamin D, Ergocalciferol, (DRISDOL) 1.25 MG (50000 UNIT) CAPS capsule, Take 1 capsule (50,000 Units total) by mouth every 7 (seven) days., Disp: 8 capsule, Rfl: 0  Allergies:  Allergies  Allergen Reactions   Carbidopa-Levodopa Nausea And Vomiting   Sertraline Hcl Other (See Comments)    Reaction:  Manic episode    Past Medical History, Surgical history, Social history, and Family History were reviewed and updated.  Review of Systems: Review of Systems  Constitutional: Negative.   HENT:  Negative.    Eyes: Negative.   Respiratory: Negative.    Cardiovascular: Negative.   Gastrointestinal: Negative.   Endocrine: Negative.   Genitourinary: Negative.    Musculoskeletal: Negative.   Skin:  Positive for rash.  Hematological: Negative.   Psychiatric/Behavioral: Negative.     Physical Exam:  weight is 159 lb (72.1 kg). Her oral temperature is 99.1 F (37.3 C). Her blood pressure is 118/86 and her pulse is 99. Her respiration is 16 and oxygen  saturation is 100%.   Wt Readings from Last 3 Encounters:  01/06/21 159 lb (72.1 kg)  12/07/20 159 lb (72.1 kg)  11/10/20 159 lb 12.8 oz (72.5 kg)    Physical Exam Vitals reviewed.  HENT:     Head: Normocephalic and atraumatic.  Eyes:     Pupils: Pupils are equal, round, and reactive to light.  Cardiovascular:     Rate and Rhythm: Normal rate and regular rhythm.     Heart sounds: Normal heart sounds.  Pulmonary:     Effort: Pulmonary effort is normal.     Breath sounds: Normal breath sounds.  Abdominal:     General: Bowel sounds are normal.     Palpations: Abdomen is soft.  Musculoskeletal:        General: No tenderness or deformity. Normal range of motion.     Cervical back: Normal range of motion.  Lymphadenopathy:     Cervical: No cervical adenopathy.  Skin:    General: Skin is warm and dry.     Findings: No erythema or rash.     Comments: She does have the papular lesions.  They seem to be more dry.  Some appear to be less prominent.  Neurological:     Mental Status: She is alert and oriented to person, place, and time.  Psychiatric:        Behavior: Behavior normal.        Thought Content: Thought content normal.        Judgment: Judgment normal.     Lab Results  Component Value Date   WBC 4.9 01/06/2021   HGB 12.3 01/06/2021   HCT 36.2 01/06/2021   MCV 86.8 01/06/2021   PLT 305 01/06/2021     Chemistry      Component Value Date/Time   NA 138 01/06/2021 0855   K 4.2 01/06/2021 0855   CL 105 01/06/2021 0855   CO2 28 01/06/2021 0855   BUN 17 01/06/2021 0855   CREATININE 0.84 01/06/2021 0855   CREATININE 0.66 01/02/2013 1557      Component Value Date/Time   CALCIUM 9.4 01/06/2021 0855   ALKPHOS 52 01/06/2021 0855   AST 17 01/06/2021 0855   ALT 18 01/06/2021 0855   BILITOT 0.4 01/06/2021 0855      Impression and Plan: [name] is a very charming 40 year old white female.  She has lymphomatoid papulosis.  This was biopsied and diagnosed.  She is  on low-dose methotrexate weekly.  Her blood counts are okay.  The lesions seem to be drying up a little bit.  Thankfully, what seems a pop up does not last long.  I am just happy that she is tolerating  the treatment well.  We certainly can increase the dose if necessary because her blood counts are still looking good.  We will now try to get her through the holidays and get her through January.  I would like to see her back in February.   Volanda Napoleon, MD 12/22/202210:17 AM

## 2021-01-07 ENCOUNTER — Telehealth: Payer: Self-pay | Admitting: Hematology & Oncology

## 2021-01-07 NOTE — Telephone Encounter (Signed)
Scheduled appt per 12/22 LOS - patient is aware of appt date and time and is mychart active.

## 2021-01-10 ENCOUNTER — Encounter: Payer: Self-pay | Admitting: Hematology & Oncology

## 2021-01-31 ENCOUNTER — Other Ambulatory Visit (HOSPITAL_COMMUNITY): Payer: Self-pay

## 2021-02-09 ENCOUNTER — Other Ambulatory Visit: Payer: Self-pay | Admitting: Hematology & Oncology

## 2021-02-24 ENCOUNTER — Inpatient Hospital Stay: Payer: BC Managed Care – PPO

## 2021-02-24 ENCOUNTER — Telehealth: Payer: Self-pay | Admitting: *Deleted

## 2021-02-24 ENCOUNTER — Other Ambulatory Visit: Payer: Self-pay

## 2021-02-24 ENCOUNTER — Other Ambulatory Visit: Payer: Self-pay | Admitting: Hematology & Oncology

## 2021-02-24 ENCOUNTER — Other Ambulatory Visit: Payer: Self-pay | Admitting: *Deleted

## 2021-02-24 ENCOUNTER — Inpatient Hospital Stay: Payer: BC Managed Care – PPO | Attending: Hematology & Oncology | Admitting: Hematology & Oncology

## 2021-02-24 ENCOUNTER — Encounter: Payer: Self-pay | Admitting: Hematology & Oncology

## 2021-02-24 ENCOUNTER — Other Ambulatory Visit (HOSPITAL_COMMUNITY): Payer: Self-pay

## 2021-02-24 VITALS — BP 110/82 | HR 99 | Temp 98.0°F | Resp 18 | Ht 68.5 in | Wt 160.0 lb

## 2021-02-24 DIAGNOSIS — Z79899 Other long term (current) drug therapy: Secondary | ICD-10-CM | POA: Diagnosis not present

## 2021-02-24 DIAGNOSIS — R110 Nausea: Secondary | ICD-10-CM

## 2021-02-24 DIAGNOSIS — K589 Irritable bowel syndrome without diarrhea: Secondary | ICD-10-CM | POA: Insufficient documentation

## 2021-02-24 DIAGNOSIS — C866 Primary cutaneous CD30-positive T-cell proliferations not having achieved remission: Secondary | ICD-10-CM

## 2021-02-24 LAB — CBC WITH DIFFERENTIAL (CANCER CENTER ONLY)
Abs Immature Granulocytes: 0 10*3/uL (ref 0.00–0.07)
Basophils Absolute: 0.1 10*3/uL (ref 0.0–0.1)
Basophils Relative: 1 %
Eosinophils Absolute: 0 10*3/uL (ref 0.0–0.5)
Eosinophils Relative: 1 %
HCT: 36 % (ref 36.0–46.0)
Hemoglobin: 12.4 g/dL (ref 12.0–15.0)
Immature Granulocytes: 0 %
Lymphocytes Relative: 33 %
Lymphs Abs: 1.6 10*3/uL (ref 0.7–4.0)
MCH: 30 pg (ref 26.0–34.0)
MCHC: 34.4 g/dL (ref 30.0–36.0)
MCV: 87.2 fL (ref 80.0–100.0)
Monocytes Absolute: 0.5 10*3/uL (ref 0.1–1.0)
Monocytes Relative: 10 %
Neutro Abs: 2.6 10*3/uL (ref 1.7–7.7)
Neutrophils Relative %: 55 %
Platelet Count: 305 10*3/uL (ref 150–400)
RBC: 4.13 MIL/uL (ref 3.87–5.11)
RDW: 13.2 % (ref 11.5–15.5)
WBC Count: 4.8 10*3/uL (ref 4.0–10.5)
nRBC: 0 % (ref 0.0–0.2)

## 2021-02-24 LAB — CMP (CANCER CENTER ONLY)
ALT: 17 U/L (ref 0–44)
AST: 16 U/L (ref 15–41)
Albumin: 4.1 g/dL (ref 3.5–5.0)
Alkaline Phosphatase: 54 U/L (ref 38–126)
Anion gap: 7 (ref 5–15)
BUN: 9 mg/dL (ref 6–20)
CO2: 27 mmol/L (ref 22–32)
Calcium: 8.9 mg/dL (ref 8.9–10.3)
Chloride: 104 mmol/L (ref 98–111)
Creatinine: 0.78 mg/dL (ref 0.44–1.00)
GFR, Estimated: >60 mL/min (ref >60)
Glucose, Bld: 94 mg/dL (ref 70–99)
Potassium: 3.6 mmol/L (ref 3.5–5.1)
Sodium: 138 mmol/L (ref 135–145)
Total Bilirubin: 0.6 mg/dL (ref 0.3–1.2)
Total Protein: 6.5 g/dL (ref 6.5–8.1)

## 2021-02-24 LAB — LACTATE DEHYDROGENASE: LDH: 162 U/L (ref 98–192)

## 2021-02-24 MED ORDER — FLUCONAZOLE 150 MG PO TABS: 150.0000 mg | ORAL_TABLET | Freq: Every day | ORAL | 3 refills | Status: DC

## 2021-02-24 MED ORDER — ONDANSETRON HCL 8 MG PO TABS: 8.0000 mg | ORAL_TABLET | Freq: Three times a day (TID) | ORAL | 2 refills | Status: DC | PRN

## 2021-02-24 MED ORDER — METHOTREXATE 2.5 MG PO TABS
15.0000 mg | ORAL_TABLET | ORAL | 2 refills | Status: DC
  Filled 2021-02-24: qty 24, 28d supply, fill #0

## 2021-02-24 MED ORDER — METHOTREXATE 2.5 MG PO TABS: 15.0000 mg | ORAL_TABLET | ORAL | 2 refills | Status: DC

## 2021-02-24 NOTE — Telephone Encounter (Signed)
Per 02/24/21 - gave upcoming appointments - confirmed

## 2021-02-24 NOTE — Progress Notes (Signed)
Christine Duncan 10/27/1980 [id number]   History:  41 y.o. G 5P2 presents for annual exam, last office visit 2019 tx'd yesterday with Diflucan by oncologist for vaginal yeast infection (diagnosed with lymphomatoid papulosis 2021--currently on chemo), complains of increased PMS since IUD removal years ago  Gynecologic History Patient's last menstrual period was 02/03/2021. Period Cycle (Days):  (24-29 day cycles) Period Duration (Days): 7 Period Pattern: (!) Irregular Menstrual Flow: Light, Heavy (heavy days 3-5) Dysmenorrhea: (!) Mild Contraception/Family planning: none Sexually active: yes Last Pap: 2019. Results were: normal. History of abnormals Last mammogram: never.   Obstetric History OB History  Gravida Para Term Preterm AB Living  5 2 2   3 2   SAB IAB Ectopic Multiple Live Births  3            # Outcome Date GA Lbr Len/2nd Weight Sex Delivery Anes PTL Lv  5 Term           4 Term           3 SAB           2 SAB           1 SAB              The following portions of the patient's history were reviewed and updated as appropriate: allergies, current medications, past family history, past medical history, past social history, past surgical history, and problem list.  Review of Systems Pertinent items noted in HPI and remainder of comprehensive ROS otherwise negative.   Past medical history, past surgical history, family history and social history were all reviewed and documented in the EPIC chart.  ROS:  A ROS was performed and pertinent positives and negatives are included.  Exam:  Vitals:   02/25/21 0828  BP: 132/84  Weight: 159 lb (72.1 kg)  Height: 5' 7.5" (1.715 m)   Body mass index is 24.54 kg/m.  General appearance:  Normal Thyroid:  Symmetrical, normal in size, without palpable masses or nodularity. Respiratory  Auscultation:  Clear without wheezing or rhonchi Cardiovascular  Auscultation:  Regular rate, without rubs, murmurs or  gallops  Edema/varicosities:  Not grossly evident Abdominal  Soft,nontender, without masses, guarding or rebound.  Liver/spleen:  No organomegaly noted  Hernia:  None appreciated  Skin  Inspection:  Grossly normal Breasts: Examined lying and sitting.   Right: Without masses, retractions, nipple discharge or axillary adenopathy.   Left: Without masses, retractions, nipple discharge or axillary adenopathy. Genitourinary   Inguinal/mons:  Normal without inguinal adenopathy  External genitalia:  Normal appearing vulva with no masses, tenderness, or lesions  BUS/Urethra/Skene's glands:  Normal without masses or exudate  Vagina:  Normal appearing with normal color and discharge, no lesions  Cervix:  Normal appearing without discharge or lesions  Uterus:  Normal in size, shape and contour.  Mobile, nontender  Adnexa/parametria:     Rt: Normal in size, without masses or tenderness.   Lt: Normal in size, without masses or tenderness.  Anus and perineum: Normal  Digital rectal exam: Normal sphincter tone without palpated masses or tenderness  Patient informed chaperone available to be present for breast and pelvic exam. Patient has requested no chaperone to be present. Patient has been advised what will be completed during breast and pelvic exam.   Assessment/Plan:   -Well woman exam -PMS; will begin OCPs with next cycle to control symptoms -Vaping cessation encouraged -Schedule mammogram  Discussed SBE, colonoscopy and DEXA screening as directed/appropriate. Recommend 171mins  of exercise weekly, including weight bearing exercise. Encouraged the use of seatbelts and sunscreen. Return in 43mos for OCP f/u  and1 year for annual or as needed.   Rubbie Battiest B WHNP-BC 9:09 AM 02/25/2021

## 2021-02-24 NOTE — Progress Notes (Signed)
Hematology and Oncology Follow Up Visit  Christine Duncan [id number] Mar 18, 1980 41 y.o. 02/24/2021   Principle Diagnosis:  Lymphomatoid papulosis  Current Therapy:   Methotrexate 15 mg p.o. weekly -- changed on 02/24/2021     Interim History:  Christine Duncan is back for follow-up.  Unfortunately, looks like she is having some more outbreaks.  We will get have to increase the methotrexate up to 15 mg a week.  Otherwise she seems to be doing pretty well.  She does have a "yeast" infection down in the more personal area.  We will send in some Diflucan for her.  She has had no problems with cough.  There is no nausea or vomiting.  She has had no fever.  There is been no bleeding.  She has had a little bit of diarrhea but she also has irritable bowel.  Her daughter had some problems this morning.  She is down to the emergency room.  She is having some neck issues.  Hopefully, there is nothing going on with her.  She is still working.  She is working quite a bit.  Overall, I will say that her performance status is ECOG 0.     Medications:  Current Outpatient Medications:    amphetamine-dextroamphetamine (ADDERALL) 20 MG tablet, Take 20 mg by mouth daily as needed (to focus). , Disp: , Rfl: 0   cetirizine (ZYRTEC) 10 MG tablet, Take 10 mg by mouth daily., Disp: , Rfl:    Cholecalciferol (VITAMIN D3) 50 MCG (2000 UT) capsule, Take 1 capsule (2,000 Units total) by mouth daily., Disp: 100 capsule, Rfl: 3   clonazePAM (KLONOPIN) 0.5 MG disintegrating tablet, Take 1 mg by mouth 3 (three) times daily as needed (for anxiety)., Disp: , Rfl:    cyanocobalamin (,VITAMIN B-12,) 1000 MCG/ML injection, Give 1000 mcg of Vitamin b12 sq daily x 1 week, then weekly for 1 month, then once every 2 weeks, Disp: 10 mL, Rfl: 6   cyclobenzaprine (FLEXERIL) 5 MG tablet, Take 1 tablet (5 mg total) by mouth 3 (three) times daily as needed for muscle spasms., Disp: 30 tablet, Rfl: 1   lamoTRIgine (LAMICTAL) 100 MG tablet,  Take 100 mg by mouth daily., Disp: , Rfl:    methotrexate (RHEUMATREX) 2.5 MG tablet, Take 4 tablets (10 mg total) by mouth once a week. Caution:Chemotherapy. Protect from light., Disp: 16 tablet, Rfl: 2   omeprazole (PRILOSEC) 40 MG capsule, Take 1 capsule (40 mg total) by mouth daily., Disp: 90 capsule, Rfl: 3   ondansetron (ZOFRAN) 8 MG tablet, TAKE 1 TABLET BY MOUTH EVERY 8 HOURS AS NEEDED FOR NAUSEA OR VOMITING., Disp: 30 tablet, Rfl: 0   SYRINGE-NEEDLE, DISP, 3 ML (BD ECLIPSE SYRINGE) 25G X 1" 3 ML MISC, Give 1000 mcg of Vitamin b12 sq daily x 1 week, then weekly for 1 month, then once every 2 weeks, Disp: 50 each, Rfl: 3   temazepam (RESTORIL) 15 MG capsule, Take 15 mg by mouth at bedtime., Disp: , Rfl:    triamcinolone cream (KENALOG) 0.1 %, Apply topically 2 (two) times daily as needed., Disp: , Rfl:    Vitamin D, Ergocalciferol, (DRISDOL) 1.25 MG (50000 UNIT) CAPS capsule, Take 1 capsule (50,000 Units total) by mouth every 7 (seven) days., Disp: 8 capsule, Rfl: 0  Allergies:  Allergies  Allergen Reactions   Sertraline Hcl Other (See Comments)    Reaction:  Manic episode   Carbidopa-Levodopa Nausea And Vomiting    Past Medical History, Surgical history, Social history,  and Family History were reviewed and updated.  Review of Systems: Review of Systems  Constitutional: Negative.   HENT:  Negative.    Eyes: Negative.   Respiratory: Negative.    Cardiovascular: Negative.   Gastrointestinal: Negative.   Endocrine: Negative.   Genitourinary: Negative.    Musculoskeletal: Negative.   Skin:  Positive for rash.  Hematological: Negative.   Psychiatric/Behavioral: Negative.     Physical Exam:  height is 5' 8.5" (1.74 m) and weight is 160 lb (72.6 kg). Her oral temperature is 98 F (36.7 C). Her blood pressure is 110/82 and her pulse is 99. Her respiration is 18 and oxygen saturation is 100%.   Wt Readings from Last 3 Encounters:  02/24/21 160 lb (72.6 kg)  01/06/21 159 lb (72.1  kg)  12/07/20 159 lb (72.1 kg)    Physical Exam Vitals reviewed.  HENT:     Head: Normocephalic and atraumatic.  Eyes:     Pupils: Pupils are equal, round, and reactive to light.  Cardiovascular:     Rate and Rhythm: Normal rate and regular rhythm.     Heart sounds: Normal heart sounds.  Pulmonary:     Effort: Pulmonary effort is normal.     Breath sounds: Normal breath sounds.  Abdominal:     General: Bowel sounds are normal.     Palpations: Abdomen is soft.  Musculoskeletal:        General: No tenderness or deformity. Normal range of motion.     Cervical back: Normal range of motion.  Lymphadenopathy:     Cervical: No cervical adenopathy.  Skin:    General: Skin is warm and dry.     Findings: No erythema or rash.     Comments: She does have the papular lesions.  They seem to be more dry.  Some appear to be less prominent.  Neurological:     Mental Status: She is alert and oriented to person, place, and time.  Psychiatric:        Behavior: Behavior normal.        Thought Content: Thought content normal.        Judgment: Judgment normal.     Lab Results  Component Value Date   WBC 4.9 01/06/2021   HGB 12.3 01/06/2021   HCT 36.2 01/06/2021   MCV 86.8 01/06/2021   PLT 305 01/06/2021     Chemistry      Component Value Date/Time   NA 138 01/06/2021 0855   K 4.2 01/06/2021 0855   CL 105 01/06/2021 0855   CO2 28 01/06/2021 0855   BUN 17 01/06/2021 0855   CREATININE 0.84 01/06/2021 0855   CREATININE 0.66 01/02/2013 1557      Component Value Date/Time   CALCIUM 9.4 01/06/2021 0855   ALKPHOS 52 01/06/2021 0855   AST 17 01/06/2021 0855   ALT 18 01/06/2021 0855   BILITOT 0.4 01/06/2021 0855      Impression and Plan: Christine Duncan is a very charming 41 year old white female.  She has lymphomatoid papulosis.  This was biopsied and diagnosed.  She is on low-dose methotrexate weekly.  Again, we will have to increase the methotrexate up to 15 mg weekly.  Hopefully, we  will see that this is going to help her out.  I would like to get her back in 2 months.  By then, we will know if the 15 mg dose is not helping [place].  Her liver tests all look good.  As such, I do not  see that the methotrexate dose increase will be a problem.    Volanda Napoleon, MD 2/9/20238:28 AM

## 2021-02-25 ENCOUNTER — Other Ambulatory Visit (HOSPITAL_COMMUNITY)
Admission: RE | Admit: 2021-02-25 | Discharge: 2021-02-25 | Disposition: A | Discharge status: Home / Self Care | Payer: BC Managed Care – PPO | Source: Ambulatory Visit | Attending: Radiology | Admitting: Radiology

## 2021-02-25 ENCOUNTER — Ambulatory Visit (INDEPENDENT_AMBULATORY_CARE_PROVIDER_SITE_OTHER): Payer: BC Managed Care – PPO | Admitting: Radiology

## 2021-02-25 ENCOUNTER — Encounter: Payer: Self-pay | Admitting: Radiology

## 2021-02-25 VITALS — BP 132/84 | Ht 67.5 in | Wt 159.0 lb

## 2021-02-25 DIAGNOSIS — Z01419 Encounter for gynecological examination (general) (routine) without abnormal findings: Secondary | ICD-10-CM | POA: Diagnosis not present

## 2021-02-25 DIAGNOSIS — Z9889 Other specified postprocedural states: Secondary | ICD-10-CM | POA: Diagnosis not present

## 2021-02-25 DIAGNOSIS — F17200 Nicotine dependence, unspecified, uncomplicated: Secondary | ICD-10-CM

## 2021-02-25 DIAGNOSIS — N943 Premenstrual tension syndrome: Secondary | ICD-10-CM | POA: Diagnosis not present

## 2021-02-25 MED ORDER — NORETHIN ACE-ETH ESTRAD-FE 1-20 MG-MCG PO TABS: 1.0000 | ORAL_TABLET | Freq: Every day | ORAL | 4 refills | Status: DC

## 2021-02-25 NOTE — Addendum Note (Signed)
Addended by: Rubbie Battiest on: 02/25/2021 09:59 AM   Modules accepted: Orders

## 2021-03-02 ENCOUNTER — Encounter: Payer: Self-pay | Admitting: Radiology

## 2021-03-03 ENCOUNTER — Other Ambulatory Visit: Payer: Self-pay

## 2021-03-03 DIAGNOSIS — R87810 Cervical high risk human papillomavirus (HPV) DNA test positive: Secondary | ICD-10-CM

## 2021-03-03 LAB — CYTOLOGY - PAP
Comment: NEGATIVE
Comment: NEGATIVE
Diagnosis: NEGATIVE
HPV 16: POSITIVE — AB
HPV 18 / 45: NEGATIVE
High risk HPV: POSITIVE — AB

## 2021-03-09 ENCOUNTER — Other Ambulatory Visit (HOSPITAL_COMMUNITY)
Admission: RE | Admit: 2021-03-09 | Discharge: 2021-03-09 | Disposition: A | Discharge status: Home / Self Care | Payer: BC Managed Care – PPO | Source: Ambulatory Visit | Attending: Obstetrics and Gynecology | Admitting: Obstetrics and Gynecology

## 2021-03-09 ENCOUNTER — Encounter: Payer: Self-pay | Admitting: Obstetrics and Gynecology

## 2021-03-09 ENCOUNTER — Other Ambulatory Visit: Payer: Self-pay

## 2021-03-09 ENCOUNTER — Ambulatory Visit (INDEPENDENT_AMBULATORY_CARE_PROVIDER_SITE_OTHER): Payer: BC Managed Care – PPO | Admitting: Obstetrics and Gynecology

## 2021-03-09 VITALS — BP 136/88 | HR 99 | Ht 67.5 in | Wt 161.0 lb

## 2021-03-09 DIAGNOSIS — Z113 Encounter for screening for infections with a predominantly sexual mode of transmission: Secondary | ICD-10-CM | POA: Diagnosis not present

## 2021-03-09 DIAGNOSIS — Z01812 Encounter for preprocedural laboratory examination: Secondary | ICD-10-CM

## 2021-03-09 DIAGNOSIS — R87810 Cervical high risk human papillomavirus (HPV) DNA test positive: Secondary | ICD-10-CM

## 2021-03-09 DIAGNOSIS — N879 Dysplasia of cervix uteri, unspecified: Secondary | ICD-10-CM | POA: Diagnosis not present

## 2021-03-09 LAB — PREGNANCY, URINE: Preg Test, Ur: NEGATIVE

## 2021-03-09 NOTE — Progress Notes (Signed)
GYNECOLOGY  VISIT   HPI: 41 y.o.   Married White or Caucasian Not Hispanic or Latino  female   [phone] with Patient's last menstrual period was 03/02/2021.   here for colposcopy.   Recent pap was negative with +HPV 16. Prior pap in 2019 was negative with negative hpv. 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   She is on low dose methotrexate.  Married for almost 23 years, doesn't think he is cheating.   GYNECOLOGIC HISTORY: Patient's last menstrual period was 03/02/2021. 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   ANA positive 11/10/2020   Urinary frequency 10/26/2020   Rash 10/25/2020   Contusion of left leg 10/31/2017   Mass of left wrist 09/24/2017   Dandruff in adult 11/07/2016   Benign mole 09/15/2016   Throat swelling 08/24/2016   Thrush 08/24/2016   Smoker 08/24/2016   Acute appendicitis 08/19/2016   Well adult exam 08/01/2016   Acute tonsillitis 06/28/2016   Hypokalemia 07/29/2014   Sialoadenitis 07/29/2014   Warts 07/29/2014   Shingles outbreak 12/10/2013   Hoarseness 12/04/2013   Knee MCL sprain 09/02/2013   Acute meniscal tear of left knee 09/02/2013   Pes anserine bursitis 09/02/2013   Pes planus 09/02/2013   Bipolar I disorder, most recent episode (or current) manic ([place]) 11/27/2012   Dyslipidemia 11/07/2012   Fibromyalgia muscle pain 09/13/2011   Low back pain 09/13/2011   Spastic colon    Essential tremor    Rosacea 08/02/2010   EYE FLOATERS 03/29/2010   Essential hypertension 01/25/2010   SWEATING 01/25/2010   Tobacco use 10/21/2008   FATIGUE 10/21/2008   URTICARIA 06/06/2008   ESOPHAGITIS 05/08/2008   GASTRITIS,  CHRONIC 05/08/2008   COLITIS 05/08/2008   IBS 05/08/2008   DYSTONIA 04/20/2008   GERD 04/13/2008   B12 deficiency 01/20/2008   INSOMNIA, PERSISTENT 01/20/2008   Myoclonus 01/20/2008   DSORD BIPOLAR I, UNSPC, MOST RECENT EPSD 08/25/2006   SYNCOPE 08/25/2006    Past Medical History:  Diagnosis Date   Acid reflux    Allergy    Anemia    Anxiety    B12 deficiency    Bipolar 1 disorder (HCC)    Blood type, Rh negative    Cervical dysplasia    Depression    Essential tremor    Fibromyalgia    Goals of care, counseling/discussion 12/07/2020   Hives    IBS (irritable bowel syndrome)    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[place])    Neuromuscular disorder ([place])    fibromylagia   Spastic colon     Past Surgical History:  Procedure Laterality Date   CERVICAL BIOPSY  W/ LOOP ELECTRODE EXCISION     CESAREAN SECTION  2006   CESAREAN SECTION  2011   COLPOSCOPY     DILATION AND CURETTAGE OF UTERUS  2005   INTRAUTERINE DEVICE INSERTION  05/09/2016  LAPAROSCOPIC APPENDECTOMY N/A 08/19/2016   Procedure: APPENDECTOMY LAPAROSCOPIC;  Surgeon: Alphonsa Overall, MD;  Location: [facility];  Service: General;  Laterality: N/A;   LEEP  07/2010   CIN II, Margins free    Current Outpatient Medications  Medication Sig Dispense Refill   amphetamine-dextroamphetamine (ADDERALL XR) 20 MG 24 hr capsule Take 20 mg by mouth daily.     amphetamine-dextroamphetamine (ADDERALL) 20 MG tablet Take 20 mg by mouth daily as needed (to focus).   0   cetirizine (ZYRTEC) 10 MG tablet Take 10 mg by mouth daily.     clonazePAM (KLONOPIN) 0.5 MG disintegrating tablet Take 1 mg by mouth 3 (three) times daily as needed (for anxiety).     cyanocobalamin (,VITAMIN B-12,) 1000 MCG/ML injection Give 1000 mcg of Vitamin b12 sq daily x 1 week, then weekly for 1 month, then once every 2 weeks 10 mL 6   cyclobenzaprine (FLEXERIL) 5 MG tablet Take 1 tablet (5 mg total) by mouth 3 (three) times daily as needed for muscle spasms. 30 tablet 1    lamoTRIgine (LAMICTAL) 100 MG tablet Take 100 mg by mouth daily.     methotrexate (RHEUMATREX) 2.5 MG tablet Take 6 tablets (15 mg total) by mouth once a week. Caution:Chemotherapy. Protect from light. 18 tablet 2   methotrexate (RHEUMATREX) 2.5 MG tablet Take 6 tablets (15 mg total) by mouth once a week. Caution:  Chemotherapy. Protect from light. 24 tablet 2   norethindrone-ethinyl estradiol-FE (JUNEL FE 1/20) 1-20 MG-MCG tablet Take 1 tablet by mouth daily. 84 tablet 4   omeprazole (PRILOSEC) 40 MG capsule Take 1 capsule (40 mg total) by mouth daily. 90 capsule 3   ondansetron (ZOFRAN) 8 MG tablet Take 1 tablet (8 mg total) by mouth every 8 (eight) hours as needed. 30 tablet 2   SYRINGE-NEEDLE, DISP, 3 ML (BD ECLIPSE SYRINGE) 25G X 1" 3 ML MISC Give 1000 mcg of Vitamin b12 sq daily x 1 week, then weekly for 1 month, then once every 2 weeks 50 each 3   temazepam (RESTORIL) 15 MG capsule Take 15 mg by mouth at bedtime.     triamcinolone cream (KENALOG) 0.1 % Apply topically 2 (two) times daily as needed.     Vitamin D, Ergocalciferol, (DRISDOL) 1.25 MG (50000 UNIT) CAPS capsule Take 1 capsule (50,000 Units total) by mouth every 7 (seven) days. 8 capsule 0   No current facility-administered medications for this visit.     ALLERGIES: Sertraline hcl and Carbidopa-levodopa  Family History  Problem Relation Age of Onset   Thyroid disease Mother    Depression Mother    Cancer Father 50       esoph ca   Esophageal cancer Father    Allergic rhinitis Brother    Asthma Paternal Aunt    Allergic rhinitis Paternal Aunt    Seizures Maternal Grandmother    Thyroid disease Maternal Grandmother    Depression Maternal Grandmother    Diabetes Maternal Grandmother    Bipolar disorder Maternal Grandfather    Alcohol abuse Maternal Grandfather    Drug abuse Maternal Grandfather    Hyperlipidemia Paternal Grandmother    Diabetes Paternal Grandmother    Breast cancer Paternal Grandmother 51   Cancer  Paternal Grandmother        Bladder, colon cancer, breast, liver cancer   Colon cancer Paternal Grandmother    Bipolar disorder Paternal Grandmother    Stroke Paternal Grandmother    Diabetes Paternal Grandfather    Rectal cancer  Neg Hx    Stomach cancer Neg Hx     Social History   Socioeconomic History   Marital status: Married    Spouse name: Not on file   Number of children: Not on file   Years of education: Not on file   Highest education level: Not on file  Occupational History   Occupation: WORKS [employer]    Employer: [employer]    Comment: [employer]  Tobacco Use   Smoking status: Former    Packs/day: 1.00    Types: E-cigarettes, Cigarettes   Smokeless tobacco: Never   Tobacco comments:    Pt Vapes everyday.  Vaping Use   Vaping Use: Every day   Substances: Nicotine, Flavoring  Substance and Sexual Activity   Alcohol use: Yes    Alcohol/week: 0.0 standard drinks    Comment: Rare   Drug use: No   Sexual activity: Yes    Partners: Male    Birth control/protection: None    Comment: 1st intercourse 41 yo-Fewer than 5 partners Mirena 05/09/2016   Other Topics Concern   Not on file  Social History Narrative   Regular exercise- yes   Social Determinants of Health   Financial Resource Strain: Not on file  Food Insecurity: Not on file  Transportation Needs: Not on file  Physical Activity: Not on file  Stress: Not on file  Social Connections: Not on file  Intimate Partner Violence: Not on file    Review of Systems  All other systems reviewed and are negative.  PHYSICAL EXAMINATION:    BP 136/88    Pulse 99    Ht 5' 7.5" (1.715 m)    Wt 161 lb (73 kg)    LMP 03/02/2021    SpO2 99%    BMI 24.84 kg/m     General appearance: alert, cooperative and appears stated age  Pelvic: External genitalia:  no lesions              Urethra:  normal appearing urethra with no masses, tenderness or lesions              Bartholins and Skenes: normal                 Vagina:  normal appearing vagina with normal color and discharge, no lesions              Cervix: no lesions and evidence of prior leep, + ectropion   Colposcopy: satisfactory, minimal aceto-white changes at 1 o'clock, biopsy taken. ECC done. Hemostasis obtained with silver nitrate.  Negative lugols of upper vagina.                Chaperone was present for exam.  1. Papanicolaou smear of cervix with positive high risk human papilloma virus (HPV) test - Colposcopy - Surgical pathology( [facility]/ [facility])  2. Screening examination for STD (sexually transmitted disease) - SURESWAB CT/NG/T. vaginalis  3. Pre-procedure lab exam - Pregnancy, urine

## 2021-03-09 NOTE — Patient Instructions (Signed)

## 2021-03-10 LAB — SURESWAB CT/NG/T. VAGINALIS
C. trachomatis RNA, TMA: NOT DETECTED
N. gonorrhoeae RNA, TMA: NOT DETECTED
Trichomonas vaginalis RNA: NOT DETECTED

## 2021-03-11 LAB — SURGICAL PATHOLOGY

## 2021-03-16 ENCOUNTER — Other Ambulatory Visit: Payer: Self-pay | Admitting: Hematology & Oncology

## 2021-04-21 ENCOUNTER — Inpatient Hospital Stay (HOSPITAL_BASED_OUTPATIENT_CLINIC_OR_DEPARTMENT_OTHER): Payer: BC Managed Care – PPO | Admitting: Hematology & Oncology

## 2021-04-21 ENCOUNTER — Encounter: Payer: Self-pay | Admitting: Hematology & Oncology

## 2021-04-21 ENCOUNTER — Inpatient Hospital Stay: Payer: BC Managed Care – PPO | Attending: Hematology & Oncology

## 2021-04-21 ENCOUNTER — Other Ambulatory Visit: Payer: Self-pay

## 2021-04-21 VITALS — BP 118/93 | HR 90 | Temp 98.3°F | Resp 18 | Ht 67.5 in | Wt 163.1 lb

## 2021-04-21 DIAGNOSIS — C866 Primary cutaneous CD30-positive T-cell proliferations: Secondary | ICD-10-CM | POA: Insufficient documentation

## 2021-04-21 DIAGNOSIS — Z79899 Other long term (current) drug therapy: Secondary | ICD-10-CM | POA: Diagnosis not present

## 2021-04-21 DIAGNOSIS — K580 Irritable bowel syndrome with diarrhea: Secondary | ICD-10-CM | POA: Diagnosis not present

## 2021-04-21 LAB — CMP (CANCER CENTER ONLY)
ALT: 10 U/L (ref 0–44)
AST: 15 U/L (ref 15–41)
Albumin: 3.6 g/dL (ref 3.5–5.0)
Alkaline Phosphatase: 39 U/L (ref 38–126)
Anion gap: 7 (ref 5–15)
BUN: 8 mg/dL (ref 6–20)
CO2: 25 mmol/L (ref 22–32)
Calcium: 8.4 mg/dL — ABNORMAL LOW (ref 8.9–10.3)
Chloride: 106 mmol/L (ref 98–111)
Creatinine: 0.8 mg/dL (ref 0.44–1.00)
GFR, Estimated: >60 mL/min (ref >60)
Glucose, Bld: 106 mg/dL — ABNORMAL HIGH (ref 70–99)
Potassium: 3.7 mmol/L (ref 3.5–5.1)
Sodium: 138 mmol/L (ref 135–145)
Total Bilirubin: 0.5 mg/dL (ref 0.3–1.2)
Total Protein: 7.1 g/dL (ref 6.5–8.1)

## 2021-04-21 LAB — CBC WITH DIFFERENTIAL (CANCER CENTER ONLY)
Abs Immature Granulocytes: 0.01 10*3/uL (ref 0.00–0.07)
Basophils Absolute: 0 10*3/uL (ref 0.0–0.1)
Basophils Relative: 1 %
Eosinophils Absolute: 0 10*3/uL (ref 0.0–0.5)
Eosinophils Relative: 1 %
HCT: 35.9 % — ABNORMAL LOW (ref 36.0–46.0)
Hemoglobin: 12.2 g/dL (ref 12.0–15.0)
Immature Granulocytes: 0 %
Lymphocytes Relative: 32 %
Lymphs Abs: 1.5 10*3/uL (ref 0.7–4.0)
MCH: 29.9 pg (ref 26.0–34.0)
MCHC: 34 g/dL (ref 30.0–36.0)
MCV: 88 fL (ref 80.0–100.0)
Monocytes Absolute: 0.4 10*3/uL (ref 0.1–1.0)
Monocytes Relative: 8 %
Neutro Abs: 2.8 10*3/uL (ref 1.7–7.7)
Neutrophils Relative %: 58 %
Platelet Count: 324 10*3/uL (ref 150–400)
RBC: 4.08 MIL/uL (ref 3.87–5.11)
RDW: 13.4 % (ref 11.5–15.5)
WBC Count: 4.8 10*3/uL (ref 4.0–10.5)
nRBC: 0 % (ref 0.0–0.2)

## 2021-04-21 LAB — LACTATE DEHYDROGENASE: LDH: 142 U/L (ref 98–192)

## 2021-04-21 NOTE — Progress Notes (Signed)
?Hematology and Oncology Follow Up Visit ? ?Christine Duncan ?[id number] ?30-Dec-1980 41 y.o. ?04/21/2021 ? ? ?Principle Diagnosis:  ?Lymphomatoid papulosis ? ?Current Therapy:   ?Methotrexate 15 mg p.o. weekly -- changed on 02/24/2021 ?    ?Interim History:  [name] is back for follow-up.  I think she is doing pretty well.  We did increase the methotrexate to 15 mg.  She is doing well on the 15 mg.  She is having no problems with toxicity.  She does have the irritable bowel so there is occasional bouts of diarrhea.  Patient still is having a couple outbreaks of the  LP.  They seem to drive pretty quickly though. ? ?She has had no issues with respect to nausea or vomiting.  There is been no change in bowel or bladder habits.  I think she is on oral contraceptives to try to help with hormonal regulation.  I do not think she is having monthly cycles. ? ?She is having no fever.  She is having no obvious change in bowel or bladder habits. ? ?Overall, I would say performance status is probably ECOG 0.   ? ? ?Medications:  ?Current Outpatient Medications:  ?  amphetamine-dextroamphetamine (ADDERALL XR) 20 MG 24 hr capsule, Take 20 mg by mouth daily., Disp: , Rfl:  ?  amphetamine-dextroamphetamine (ADDERALL) 20 MG tablet, Take 20 mg by mouth daily as needed (to focus). , Disp: , Rfl: 0 ?  cetirizine (ZYRTEC) 10 MG tablet, Take 10 mg by mouth daily., Disp: , Rfl:  ?  clonazePAM (KLONOPIN) 0.5 MG disintegrating tablet, Take 1 mg by mouth 3 (three) times daily as needed (for anxiety)., Disp: , Rfl:  ?  cyanocobalamin (,VITAMIN B-12,) 1000 MCG/ML injection, Give 1000 mcg of Vitamin b12 sq daily x 1 week, then weekly for 1 month, then once every 2 weeks, Disp: 10 mL, Rfl: 6 ?  cyclobenzaprine (FLEXERIL) 5 MG tablet, Take 1 tablet (5 mg total) by mouth 3 (three) times daily as needed for muscle spasms., Disp: 30 tablet, Rfl: 1 ?  lamoTRIgine (LAMICTAL) 100 MG tablet, Take 100 mg by mouth daily., Disp: , Rfl:  ?  methotrexate  (RHEUMATREX) 2.5 MG tablet, Take 6 tablets (15 mg total) by mouth once a week. Caution:  Chemotherapy. Protect from light., Disp: 24 tablet, Rfl: 2 ?  methotrexate (RHEUMATREX) 2.5 MG tablet, TAKE 6 TABLETS (15 MG TOTAL) BY MOUTH ONCE A WEEK. CAUTION:CHEMOTHERAPY. PROTECT FROM LIGHT., Disp: 72 tablet, Rfl: 1 ?  norethindrone-ethinyl estradiol-FE (JUNEL FE 1/20) 1-20 MG-MCG tablet, Take 1 tablet by mouth daily., Disp: 84 tablet, Rfl: 4 ?  omeprazole (PRILOSEC) 40 MG capsule, Take 1 capsule (40 mg total) by mouth daily., Disp: 90 capsule, Rfl: 3 ?  ondansetron (ZOFRAN) 8 MG tablet, Take 1 tablet (8 mg total) by mouth every 8 (eight) hours as needed., Disp: 30 tablet, Rfl: 2 ?  SYRINGE-NEEDLE, DISP, 3 ML (BD ECLIPSE SYRINGE) 25G X 1" 3 ML MISC, Give 1000 mcg of Vitamin b12 sq daily x 1 week, then weekly for 1 month, then once every 2 weeks, Disp: 50 each, Rfl: 3 ?  temazepam (RESTORIL) 15 MG capsule, Take 15 mg by mouth at bedtime., Disp: , Rfl:  ?  triamcinolone cream (KENALOG) 0.1 %, Apply topically 2 (two) times daily as needed., Disp: , Rfl:  ?  Vitamin D, Ergocalciferol, (DRISDOL) 1.25 MG (50000 UNIT) CAPS capsule, Take 1 capsule (50,000 Units total) by mouth every 7 (seven) days., Disp: 8 capsule, Rfl: 0 ? ?  Allergies:  ?Allergies  ?Allergen Reactions  ? Sertraline Hcl Other (See Comments)  ?  Reaction:  Manic episode  ? Carbidopa-Levodopa Nausea And Vomiting  ? ? ?Past Medical History, Surgical history, Social history, and Family History were reviewed and updated. ? ?Review of Systems: ?Review of Systems  ?Constitutional: Negative.   ?HENT:  Negative.    ?Eyes: Negative.   ?Respiratory: Negative.    ?Cardiovascular: Negative.   ?Gastrointestinal: Negative.   ?Endocrine: Negative.   ?Genitourinary: Negative.    ?Musculoskeletal: Negative.   ?Skin:  Positive for rash.  ?Hematological: Negative.   ?Psychiatric/Behavioral: Negative.    ? ?Physical Exam: ? vitals were not taken for this visit.  ? ?Wt Readings from  Last 3 Encounters:  ?03/09/21 161 lb (73 kg)  ?02/25/21 159 lb (72.1 kg)  ?02/24/21 160 lb (72.6 kg)  ? ? ?Physical Exam ?Vitals reviewed.  ?HENT:  ?   Head: Normocephalic and atraumatic.  ?Eyes:  ?   Pupils: Pupils are equal, round, and reactive to light.  ?Cardiovascular:  ?   Rate and Rhythm: Normal rate and regular rhythm.  ?   Heart sounds: Normal heart sounds.  ?Pulmonary:  ?   Effort: Pulmonary effort is normal.  ?   Breath sounds: Normal breath sounds.  ?Abdominal:  ?   General: Bowel sounds are normal.  ?   Palpations: Abdomen is soft.  ?Musculoskeletal:     ?   General: No tenderness or deformity. Normal range of motion.  ?   Cervical back: Normal range of motion.  ?Lymphadenopathy:  ?   Cervical: No cervical adenopathy.  ?Skin: ?   General: Skin is warm and dry.  ?   Findings: No erythema or rash.  ?   Comments: She does have the papular lesions.  They seem to be more dry.  Some appear to be less prominent.  ?Neurological:  ?   Mental Status: She is alert and oriented to person, place, and time.  ?Psychiatric:     ?   Behavior: Behavior normal.     ?   Thought Content: Thought content normal.     ?   Judgment: Judgment normal.  ? ? ? ?Lab Results  ?Component Value Date  ? WBC 4.8 04/21/2021  ? HGB 12.2 04/21/2021  ? HCT 35.9 (L) 04/21/2021  ? MCV 88.0 04/21/2021  ? PLT 324 04/21/2021  ? ?  Chemistry   ?   ?Component Value Date/Time  ? NA 138 02/24/2021 0808  ? K 3.6 02/24/2021 0808  ? CL 104 02/24/2021 0808  ? CO2 27 02/24/2021 0808  ? BUN 9 02/24/2021 0808  ? CREATININE 0.78 02/24/2021 0808  ? CREATININE 0.66 01/02/2013 1557  ?    ?Component Value Date/Time  ? CALCIUM 8.9 02/24/2021 0808  ? ALKPHOS 54 02/24/2021 0808  ? AST 16 02/24/2021 0808  ? ALT 17 02/24/2021 0808  ? BILITOT 0.6 02/24/2021 0076  ?  ? ? ?Impression and Plan: ?[name] is a very charming 41 year old white female.  She has lymphomatoid papulosis.  This was biopsied and diagnosed.  She is on low-dose methotrexate weekly.  Again, we w  increased the methotrexate up to 15 mg weekly back in February.  Hopefully, this will hold the LP at stability. ? ?Her blood counts certainly good enough to be able to withstand another dosage increase if need be. ? ?We will try to get her back after [facility].  This to be right before her birthday. ? ?  Volanda Napoleon, MD ?4/6/20238:38 AM  ?

## 2021-05-09 ENCOUNTER — Other Ambulatory Visit: Payer: Self-pay | Admitting: Nurse Practitioner

## 2021-05-09 DIAGNOSIS — Z3041 Encounter for surveillance of contraceptive pills: Secondary | ICD-10-CM

## 2021-05-09 MED ORDER — NORETHINDRONE ACET-ETHINYL EST 1-20 MG-MCG PO TABS: 1.0000 | ORAL_TABLET | Freq: Every day | ORAL | 1 refills | Status: DC

## 2021-05-25 ENCOUNTER — Ambulatory Visit: Payer: BC Managed Care – PPO | Admitting: Radiology

## 2021-06-01 DIAGNOSIS — F3174 Bipolar disorder, in full remission, most recent episode manic: Secondary | ICD-10-CM | POA: Diagnosis not present

## 2021-06-01 DIAGNOSIS — F3176 Bipolar disorder, in full remission, most recent episode depressed: Secondary | ICD-10-CM | POA: Diagnosis not present

## 2021-06-01 DIAGNOSIS — F900 Attention-deficit hyperactivity disorder, predominantly inattentive type: Secondary | ICD-10-CM | POA: Diagnosis not present

## 2021-06-05 ENCOUNTER — Encounter: Payer: Self-pay | Admitting: Hematology & Oncology

## 2021-06-06 ENCOUNTER — Other Ambulatory Visit: Payer: Self-pay

## 2021-06-06 DIAGNOSIS — B379 Candidiasis, unspecified: Secondary | ICD-10-CM

## 2021-06-06 DIAGNOSIS — N949 Unspecified condition associated with female genital organs and menstrual cycle: Secondary | ICD-10-CM

## 2021-06-06 MED ORDER — FLUCONAZOLE 100 MG PO TABS: 100.0000 mg | ORAL_TABLET | Freq: Every day | ORAL | 0 refills | Status: DC

## 2021-06-15 ENCOUNTER — Other Ambulatory Visit: Payer: Self-pay | Admitting: Nurse Practitioner

## 2021-06-15 DIAGNOSIS — Z3041 Encounter for surveillance of contraceptive pills: Secondary | ICD-10-CM

## 2021-06-15 NOTE — Telephone Encounter (Signed)
02/25/2021  "Return in 72mo for OCP f/u  and1 year for annual or as needed."   Per [facility] access note on file left detailed message in voice mail advising patient 3 mos follow up visit recommended at [facility]. Please call to schedule.

## 2021-06-16 ENCOUNTER — Inpatient Hospital Stay: Payer: BC Managed Care – PPO | Attending: Hematology & Oncology

## 2021-06-16 ENCOUNTER — Inpatient Hospital Stay (HOSPITAL_BASED_OUTPATIENT_CLINIC_OR_DEPARTMENT_OTHER): Payer: BC Managed Care – PPO | Admitting: Hematology & Oncology

## 2021-06-16 ENCOUNTER — Encounter: Payer: Self-pay | Admitting: Hematology & Oncology

## 2021-06-16 VITALS — BP 138/89 | HR 96 | Temp 98.9°F | Resp 17 | Wt 168.1 lb

## 2021-06-16 DIAGNOSIS — Z79899 Other long term (current) drug therapy: Secondary | ICD-10-CM | POA: Diagnosis not present

## 2021-06-16 DIAGNOSIS — C866 Primary cutaneous CD30-positive T-cell proliferations: Secondary | ICD-10-CM

## 2021-06-16 LAB — CMP (CANCER CENTER ONLY)
ALT: 16 U/L (ref 0–44)
AST: 18 U/L (ref 15–41)
Albumin: 4 g/dL (ref 3.5–5.0)
Alkaline Phosphatase: 39 U/L (ref 38–126)
Anion gap: 9 (ref 5–15)
BUN: 12 mg/dL (ref 6–20)
CO2: 26 mmol/L (ref 22–32)
Calcium: 8.7 mg/dL — ABNORMAL LOW (ref 8.9–10.3)
Chloride: 102 mmol/L (ref 98–111)
Creatinine: 1.01 mg/dL — ABNORMAL HIGH (ref 0.44–1.00)
GFR, Estimated: >60 mL/min (ref >60)
Glucose, Bld: 97 mg/dL (ref 70–99)
Potassium: 3.4 mmol/L — ABNORMAL LOW (ref 3.5–5.1)
Sodium: 137 mmol/L (ref 135–145)
Total Bilirubin: 0.4 mg/dL (ref 0.3–1.2)
Total Protein: 6.7 g/dL (ref 6.5–8.1)

## 2021-06-16 LAB — CBC WITH DIFFERENTIAL (CANCER CENTER ONLY)
Abs Immature Granulocytes: 0.02 10*3/uL (ref 0.00–0.07)
Basophils Absolute: 0.1 10*3/uL (ref 0.0–0.1)
Basophils Relative: 1 %
Eosinophils Absolute: 0 10*3/uL (ref 0.0–0.5)
Eosinophils Relative: 1 %
HCT: 36.7 % (ref 36.0–46.0)
Hemoglobin: 12.6 g/dL (ref 12.0–15.0)
Immature Granulocytes: 0 %
Lymphocytes Relative: 40 %
Lymphs Abs: 2.1 10*3/uL (ref 0.7–4.0)
MCH: 29.4 pg (ref 26.0–34.0)
MCHC: 34.3 g/dL (ref 30.0–36.0)
MCV: 85.7 fL (ref 80.0–100.0)
Monocytes Absolute: 0.4 10*3/uL (ref 0.1–1.0)
Monocytes Relative: 8 %
Neutro Abs: 2.6 10*3/uL (ref 1.7–7.7)
Neutrophils Relative %: 50 %
Platelet Count: 359 10*3/uL (ref 150–400)
RBC: 4.28 MIL/uL (ref 3.87–5.11)
RDW: 13.3 % (ref 11.5–15.5)
WBC Count: 5.2 10*3/uL (ref 4.0–10.5)
nRBC: 0 % (ref 0.0–0.2)

## 2021-06-16 LAB — LACTATE DEHYDROGENASE: LDH: 140 U/L (ref 98–192)

## 2021-06-16 NOTE — Progress Notes (Signed)
Hematology and Oncology Follow Up Visit  Christine Duncan [id number] September 22, 1980 41 y.o. 06/16/2021   Principle Diagnosis:  Lymphomatoid papulosis  Current Therapy:   Methotrexate 15 mg p.o. weekly -- changed on 02/24/2021     Interim History:  Christine Duncan is back for follow-up.  Overall, Christine Duncan is still having some breakouts of the lymphomatoid papulosis.  However, Christine Duncan says that the operation not nearly as long.  There is not as much scarring.  Christine Duncan is still working.  Christine Duncan works at assisted living.  Christine Duncan is quite busy.  Christine Duncan birthday is coming up next week.  I am sure that Christine Duncan will have a very nice birthday with Christine Duncan family.  Christine Duncan has had no issues with cough or shortness of breath.  Patient does have a salivary stone.  This is over on the right side of Christine Duncan face.  Christine Duncan has a little bit of swelling and pain.  Christine Duncan has had no bleeding.  Christine Duncan says Christine Duncan monthly cycles are not as bad.  Christine Duncan has had Christine Duncan oral contraceptives changed.  Christine Duncan has had no issues with bowels or bladder.  Christine Duncan has had no fever.  Overall, I would say Christine Duncan performance status is probably ECOG 1.       Medications:  Current Outpatient Medications:    amphetamine-dextroamphetamine (ADDERALL) 20 MG tablet, Take 20 mg by mouth daily as needed (to focus). , Disp: , Rfl: 0   cetirizine (ZYRTEC) 10 MG tablet, Take 10 mg by mouth daily., Disp: , Rfl:    clonazePAM (KLONOPIN) 0.5 MG disintegrating tablet, Take 1 mg by mouth 3 (three) times daily as needed (for anxiety)., Disp: , Rfl:    cyanocobalamin (,VITAMIN B-12,) 1000 MCG/ML injection, Give 1000 mcg of Vitamin b12 sq daily x 1 week, then weekly for 1 month, then once every 2 weeks, Disp: 10 mL, Rfl: 6   cyclobenzaprine (FLEXERIL) 5 MG tablet, Take 1 tablet (5 mg total) by mouth 3 (three) times daily as needed for muscle spasms., Disp: 30 tablet, Rfl: 1   fluconazole (DIFLUCAN) 100 MG tablet, Take 1 tablet (100 mg total) by mouth daily., Disp: 3 tablet, Rfl: 0   lamoTRIgine (LAMICTAL) 100  MG tablet, Take 100 mg by mouth daily., Disp: , Rfl:    methotrexate (RHEUMATREX) 2.5 MG tablet, TAKE 6 TABLETS (15 MG TOTAL) BY MOUTH ONCE A WEEK. CAUTION:CHEMOTHERAPY. PROTECT FROM LIGHT., Disp: 72 tablet, Rfl: 1   norethindrone-ethinyl estradiol (LOESTRIN) 1-20 MG-MCG tablet, Take 1 tablet by mouth daily., Disp: 28 tablet, Rfl: 1   omeprazole (PRILOSEC) 40 MG capsule, Take 1 capsule (40 mg total) by mouth daily., Disp: 90 capsule, Rfl: 3   ondansetron (ZOFRAN) 8 MG tablet, Take 1 tablet (8 mg total) by mouth every 8 (eight) hours as needed., Disp: 30 tablet, Rfl: 2   SYRINGE-NEEDLE, DISP, 3 ML (BD ECLIPSE SYRINGE) 25G X 1" 3 ML MISC, Give 1000 mcg of Vitamin b12 sq daily x 1 week, then weekly for 1 month, then once every 2 weeks, Disp: 50 each, Rfl: 3   temazepam (RESTORIL) 15 MG capsule, Take 15 mg by mouth at bedtime., Disp: , Rfl:    triamcinolone cream (KENALOG) 0.1 %, Apply topically 2 (two) times daily as needed., Disp: , Rfl:    Vitamin D, Ergocalciferol, (DRISDOL) 1.25 MG (50000 UNIT) CAPS capsule, Take 1 capsule (50,000 Units total) by mouth every 7 (seven) days., Disp: 8 capsule, Rfl: 0   methotrexate (RHEUMATREX) 2.5 MG tablet, Take 6 tablets (15 mg  total) by mouth once a week. Caution:  Chemotherapy. Protect from light. (Patient not taking: Reported on 06/16/2021), Disp: 24 tablet, Rfl: 2   norethindrone-ethinyl estradiol-FE (JUNEL FE 1/20) 1-20 MG-MCG tablet, Take 1 tablet by mouth daily. (Patient not taking: Reported on 06/16/2021), Disp: 84 tablet, Rfl: 4  Allergies:  Allergies  Allergen Reactions   Sertraline Hcl Other (See Comments)    Reaction:  Manic episode   Carbidopa-Levodopa Nausea And Vomiting    Past Medical History, Surgical history, Social history, and Family History were reviewed and updated.  Review of Systems: Review of Systems  Constitutional: Negative.   HENT:  Negative.    Eyes: Negative.   Respiratory: Negative.    Cardiovascular: Negative.    Gastrointestinal: Negative.   Endocrine: Negative.   Genitourinary: Negative.    Musculoskeletal: Negative.   Skin:  Positive for rash.  Hematological: Negative.   Psychiatric/Behavioral: Negative.     Physical Exam:  weight is 168 lb 1.9 oz (76.3 kg). Christine Duncan oral temperature is 98.9 F (37.2 C). Christine Duncan blood pressure is 138/89 and Christine Duncan pulse is 96. Christine Duncan respiration is 17 and oxygen saturation is 100%.   Wt Readings from Last 3 Encounters:  06/16/21 168 lb 1.9 oz (76.3 kg)  04/21/21 163 lb 1.9 oz (74 kg)  03/09/21 161 lb (73 kg)    Physical Exam Vitals reviewed.  HENT:     Head: Normocephalic and atraumatic.  Eyes:     Pupils: Pupils are equal, round, and reactive to light.  Cardiovascular:     Rate and Rhythm: Normal rate and regular rhythm.     Heart sounds: Normal heart sounds.  Pulmonary:     Effort: Pulmonary effort is normal.     Breath sounds: Normal breath sounds.  Abdominal:     General: Bowel sounds are normal.     Palpations: Abdomen is soft.  Musculoskeletal:        General: No tenderness or deformity. Normal range of motion.     Cervical back: Normal range of motion.  Lymphadenopathy:     Cervical: No cervical adenopathy.  Skin:    General: Skin is warm and dry.     Findings: No erythema or rash.     Comments: Christine Duncan does have the papular lesions.  They seem to be more dry.  Some appear to be less prominent.  Neurological:     Mental Status: Christine Duncan is alert and oriented to person, place, and time.  Psychiatric:        Behavior: Behavior normal.        Thought Content: Thought content normal.        Judgment: Judgment normal.     Lab Results  Component Value Date   WBC 5.2 06/16/2021   HGB 12.6 06/16/2021   HCT 36.7 06/16/2021   MCV 85.7 06/16/2021   PLT 359 06/16/2021     Chemistry      Component Value Date/Time   NA 137 06/16/2021 0802   K 3.4 (L) 06/16/2021 0802   CL 102 06/16/2021 0802   CO2 26 06/16/2021 0802   BUN 12 06/16/2021 0802    CREATININE 1.01 (H) 06/16/2021 0802   CREATININE 0.66 01/02/2013 1557      Component Value Date/Time   CALCIUM 8.7 (L) 06/16/2021 0802   ALKPHOS 39 06/16/2021 0802   AST 18 06/16/2021 0802   ALT 16 06/16/2021 0802   BILITOT 0.4 06/16/2021 0802      Impression and Plan: Christine Duncan is a  very charming 41 year old white female.  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  This was biopsied and diagnosed.  Christine Duncan is on low-dose methotrexate weekly.  Again, we increased the methotrexate up to 15 mg weekly back in February.  Hopefully, this will hold the LP at stability.  For right now, I do think we can just keep the dose the same for the methotrexate.  I feel pretty good about the fact that Christine Duncan skin does not look all that bad.  Christine Duncan does have a few lesions on the back of the neck on the left side.  Hopefully we can get Christine Duncan through the Summer.  I know that Christine Duncan will be busy with Christine Duncan family and work.   Volanda Napoleon, MD 6/1/202311:37 AM

## 2021-06-17 ENCOUNTER — Encounter: Payer: Self-pay | Admitting: Hematology & Oncology

## 2021-06-17 ENCOUNTER — Other Ambulatory Visit: Payer: Self-pay | Admitting: *Deleted

## 2021-06-17 MED ORDER — TRIAMCINOLONE ACETONIDE 0.1 % EX CREA: TOPICAL_CREAM | Freq: Two times a day (BID) | CUTANEOUS | 1 refills | Status: AC | PRN

## 2021-06-17 NOTE — Telephone Encounter (Signed)
Follow up OCP visit scheduled on 07/06/21

## 2021-07-06 ENCOUNTER — Ambulatory Visit: Payer: BC Managed Care – PPO | Admitting: Radiology

## 2021-07-06 VITALS — BP 118/74 | Wt 170.0 lb

## 2021-07-06 DIAGNOSIS — N943 Premenstrual tension syndrome: Secondary | ICD-10-CM | POA: Diagnosis not present

## 2021-07-06 MED ORDER — NORETHIN ACE-ETH ESTRAD-FE 1-20 MG-MCG PO TABS: 1.0000 | ORAL_TABLET | Freq: Every day | ORAL | 4 refills | Status: DC

## 2021-07-06 NOTE — Progress Notes (Signed)
   Christine Duncan [birth year] [id number]   History:  41 y.o. here for 3 month follow up after starting OCPs. Moods and periods are better, having some BTB  Gynecologic History  Obstetric History OB History  Gravida Para Term Preterm AB Living  '5 2 2   3 2  '$ SAB IAB Ectopic Multiple Live Births  3            # Outcome Date GA Lbr Len/2nd Weight Sex Delivery Anes PTL Lv  5 Term           4 Term           3 SAB           2 SAB           1 SAB              The following portions of the patient's history were reviewed and updated as appropriate: allergies, past family history, past medical history, past social history, past surgical history, and problem list.  Review of Systems A comprehensive review of systems was negative.   Past medical history, past surgical history, family history and social history were all reviewed and documented in the EPIC chart.   Exam:  Vitals:   07/06/21 1531  BP: 118/74  Weight: 170 lb (77.1 kg)   Body mass index is 26.23 kg/m.    Assessment/Plan:   1. PMS (premenstrual syndrome) Continue current OCPs, if BTB persists will change to a 24day active pill pack - norethindrone-ethinyl estradiol-FE (JUNEL FE 1/20) 1-20 MG-MCG tablet; Take 1 tablet by mouth daily. Skip placebo week, take continuously  Dispense: 84 tablet; Refill: 4    Discussed SBE, colonoscopy and DEXA screening as directed/appropriate. Recommend 152mns of exercise weekly, including weight bearing exercise. Encouraged the use of seatbelts and sunscreen. Return in 1 year for annual or as needed.   CRubbie BattiestB WHNP-BC 4:18 PM 07/06/2021

## 2021-07-07 ENCOUNTER — Other Ambulatory Visit: Payer: Self-pay | Admitting: Radiology

## 2021-07-07 DIAGNOSIS — Z3041 Encounter for surveillance of contraceptive pills: Secondary | ICD-10-CM

## 2021-09-05 ENCOUNTER — Other Ambulatory Visit: Payer: Self-pay | Admitting: Hematology & Oncology

## 2021-09-22 ENCOUNTER — Inpatient Hospital Stay (HOSPITAL_BASED_OUTPATIENT_CLINIC_OR_DEPARTMENT_OTHER): Payer: BC Managed Care – PPO | Admitting: Hematology & Oncology

## 2021-09-22 ENCOUNTER — Inpatient Hospital Stay: Payer: BC Managed Care – PPO | Attending: Hematology & Oncology

## 2021-09-22 ENCOUNTER — Encounter: Payer: Self-pay | Admitting: Hematology & Oncology

## 2021-09-22 ENCOUNTER — Other Ambulatory Visit: Payer: Self-pay | Admitting: Hematology & Oncology

## 2021-09-22 ENCOUNTER — Telehealth: Payer: Self-pay | Admitting: *Deleted

## 2021-09-22 VITALS — BP 132/103 | HR 104 | Temp 99.1°F | Resp 17 | Wt 170.4 lb

## 2021-09-22 DIAGNOSIS — C866 Primary cutaneous CD30-positive T-cell proliferations not having achieved remission: Secondary | ICD-10-CM

## 2021-09-22 DIAGNOSIS — Z79899 Other long term (current) drug therapy: Secondary | ICD-10-CM | POA: Diagnosis not present

## 2021-09-22 LAB — CMP (CANCER CENTER ONLY)
ALT: 12 U/L (ref 0–44)
AST: 15 U/L (ref 15–41)
Albumin: 4.1 g/dL (ref 3.5–5.0)
Alkaline Phosphatase: 49 U/L (ref 38–126)
Anion gap: 9 (ref 5–15)
BUN: 9 mg/dL (ref 6–20)
CO2: 26 mmol/L (ref 22–32)
Calcium: 9.2 mg/dL (ref 8.9–10.3)
Chloride: 103 mmol/L (ref 98–111)
Creatinine: 1.01 mg/dL — ABNORMAL HIGH (ref 0.44–1.00)
GFR, Estimated: >60 mL/min (ref >60)
Glucose, Bld: 106 mg/dL — ABNORMAL HIGH (ref 70–99)
Potassium: 3.9 mmol/L (ref 3.5–5.1)
Sodium: 138 mmol/L (ref 135–145)
Total Bilirubin: 0.4 mg/dL (ref 0.3–1.2)
Total Protein: 7 g/dL (ref 6.5–8.1)

## 2021-09-22 LAB — CBC WITH DIFFERENTIAL (CANCER CENTER ONLY)
Abs Immature Granulocytes: 0.01 10*3/uL (ref 0.00–0.07)
Basophils Absolute: 0.1 10*3/uL (ref 0.0–0.1)
Basophils Relative: 1 %
Eosinophils Absolute: 0.1 10*3/uL (ref 0.0–0.5)
Eosinophils Relative: 1 %
HCT: 38.3 % (ref 36.0–46.0)
Hemoglobin: 12.9 g/dL (ref 12.0–15.0)
Immature Granulocytes: 0 %
Lymphocytes Relative: 37 %
Lymphs Abs: 1.8 10*3/uL (ref 0.7–4.0)
MCH: 29.1 pg (ref 26.0–34.0)
MCHC: 33.7 g/dL (ref 30.0–36.0)
MCV: 86.3 fL (ref 80.0–100.0)
Monocytes Absolute: 0.4 10*3/uL (ref 0.1–1.0)
Monocytes Relative: 9 %
Neutro Abs: 2.6 10*3/uL (ref 1.7–7.7)
Neutrophils Relative %: 52 %
Platelet Count: 379 10*3/uL (ref 150–400)
RBC: 4.44 MIL/uL (ref 3.87–5.11)
RDW: 14 % (ref 11.5–15.5)
WBC Count: 5 10*3/uL (ref 4.0–10.5)
nRBC: 0 % (ref 0.0–0.2)

## 2021-09-22 LAB — LACTATE DEHYDROGENASE: LDH: 144 U/L (ref 98–192)

## 2021-09-22 MED ORDER — METHOTREXATE SODIUM 10 MG PO TABS: 20.0000 mg | ORAL_TABLET | ORAL | 5 refills | Status: DC

## 2021-09-22 NOTE — Progress Notes (Signed)
Hematology and Oncology Follow Up Visit  Christine Duncan [id number] [birth year] 41 y.o. 09/22/2021   Principle Diagnosis:  Lymphomatoid papulosis  Current Therapy:   Methotrexate 20 mg p.o. weekly -- changed on 09/22/2021     Interim History:  Christine Duncan is back for follow-up.  We last saw her back in June.  This is right before her birthday.  She is doing okay although she still have a lot of breakouts.  She says that they are not scarring up.  I think we will going to have to try to increase the methotrexate dose.  Her blood count is okay.  Her LFTs are okay.  As such, I think we have some flexibility with increasing the methotrexate up to 20 mg a week.  She is still working at the assisted living.  Sounds like she may be getting a new job.  The new job will allow her to work at home.  She had a relatively quiet summer.  She really did not go on vacation.  She has had no problems with fever.  There is no cough or shortness of breath.  She has had no nausea or vomiting.  She has had no change in bowel or bladder habits.  She still has her monthly cycles.  Overall, I would say that her performance status is probably ECOG 1.    Medications:  Current Outpatient Medications:    amphetamine-dextroamphetamine (ADDERALL XR) 20 MG 24 hr capsule, Take 20 mg by mouth every morning., Disp: , Rfl:    amphetamine-dextroamphetamine (ADDERALL) 20 MG tablet, Take 20 mg by mouth daily as needed (to focus). , Disp: , Rfl: 0   cetirizine (ZYRTEC) 10 MG tablet, Take 10 mg by mouth daily., Disp: , Rfl:    clonazePAM (KLONOPIN) 0.5 MG disintegrating tablet, Take 1 mg by mouth 3 (three) times daily as needed (for anxiety)., Disp: , Rfl:    cyanocobalamin (,VITAMIN B-12,) 1000 MCG/ML injection, Give 1000 mcg of Vitamin b12 sq daily x 1 week, then weekly for 1 month, then once every 2 weeks, Disp: 10 mL, Rfl: 6   cyclobenzaprine (FLEXERIL) 5 MG tablet, Take 1 tablet (5 mg total) by mouth 3 (three) times daily  as needed for muscle spasms., Disp: 30 tablet, Rfl: 1   fluconazole (DIFLUCAN) 100 MG tablet, Take 1 tablet (100 mg total) by mouth daily., Disp: 3 tablet, Rfl: 0   JUNEL 1/20 1-20 MG-MCG tablet, TAKE 1 TABLET BY MOUTH EVERY DAY, Disp: 21 tablet, Rfl: 0   lamoTRIgine (LAMICTAL) 100 MG tablet, Take 100 mg by mouth daily., Disp: , Rfl:    methotrexate (RHEUMATREX) 2.5 MG tablet, TAKE 6 TABLETS (15 MG TOTAL) BY MOUTH ONCE A WEEK. CAUTION:CHEMOTHERAPY. PROTECT FROM LIGHT., Disp: 72 tablet, Rfl: 1   omeprazole (PRILOSEC) 40 MG capsule, Take 1 capsule (40 mg total) by mouth daily., Disp: 90 capsule, Rfl: 3   ondansetron (ZOFRAN) 8 MG tablet, Take 1 tablet (8 mg total) by mouth every 8 (eight) hours as needed., Disp: 30 tablet, Rfl: 2   SYRINGE-NEEDLE, DISP, 3 ML (BD ECLIPSE SYRINGE) 25G X 1" 3 ML MISC, Give 1000 mcg of Vitamin b12 sq daily x 1 week, then weekly for 1 month, then once every 2 weeks, Disp: 50 each, Rfl: 3   temazepam (RESTORIL) 15 MG capsule, Take 15 mg by mouth at bedtime., Disp: , Rfl:    triamcinolone cream (KENALOG) 0.1 %, Apply topically 2 (two) times daily as needed., Disp: 30 g, Rfl: 1  Vitamin D, Ergocalciferol, (DRISDOL) 1.25 MG (50000 UNIT) CAPS capsule, Take 1 capsule (50,000 Units total) by mouth every 7 (seven) days., Disp: 8 capsule, Rfl: 0   methotrexate (RHEUMATREX) 2.5 MG tablet, Take 6 tablets (15 mg total) by mouth once a week. Caution:  Chemotherapy. Protect from light. (Patient not taking: Reported on 06/16/2021), Disp: 24 tablet, Rfl: 2   norethindrone-ethinyl estradiol-FE (JUNEL FE 1/20) 1-20 MG-MCG tablet, Take 1 tablet by mouth daily. Skip placebo week, take continuously (Patient not taking: Reported on 09/22/2021), Disp: 84 tablet, Rfl: 4  Allergies:  Allergies  Allergen Reactions   Sertraline Hcl Other (See Comments)    Reaction:  Manic episode   Carbidopa-Levodopa Nausea And Vomiting    Past Medical History, Surgical history, Social history, and Family History  were reviewed and updated.  Review of Systems: Review of Systems  Constitutional: Negative.   HENT:  Negative.    Eyes: Negative.   Respiratory: Negative.    Cardiovascular: Negative.   Gastrointestinal: Negative.   Endocrine: Negative.   Genitourinary: Negative.    Musculoskeletal: Negative.   Skin:  Positive for rash.  Hematological: Negative.   Psychiatric/Behavioral: Negative.      Physical Exam:  weight is 170 lb 6.4 oz (77.3 kg). Her oral temperature is 99.1 F (37.3 C). Her respiration is 17.   Wt Readings from Last 3 Encounters:  09/22/21 170 lb 6.4 oz (77.3 kg)  07/06/21 170 lb (77.1 kg)  06/16/21 168 lb 1.9 oz (76.3 kg)    Physical Exam Vitals reviewed.  HENT:     Head: Normocephalic and atraumatic.  Eyes:     Pupils: Pupils are equal, round, and reactive to light.  Cardiovascular:     Rate and Rhythm: Normal rate and regular rhythm.     Heart sounds: Normal heart sounds.  Pulmonary:     Effort: Pulmonary effort is normal.     Breath sounds: Normal breath sounds.  Abdominal:     General: Bowel sounds are normal.     Palpations: Abdomen is soft.  Musculoskeletal:        General: No tenderness or deformity. Normal range of motion.     Cervical back: Normal range of motion.  Lymphadenopathy:     Cervical: No cervical adenopathy.  Skin:    General: Skin is warm and dry.     Findings: No erythema or rash.     Comments: She does have the papular lesions.  They seem to be more dry.  Some appear to be less prominent.  Neurological:     Mental Status: She is alert and oriented to person, place, and time.  Psychiatric:        Behavior: Behavior normal.        Thought Content: Thought content normal.        Judgment: Judgment normal.     Lab Results  Component Value Date   WBC 5.0 09/22/2021   HGB 12.9 09/22/2021   HCT 38.3 09/22/2021   MCV 86.3 09/22/2021   PLT 379 09/22/2021     Chemistry      Component Value Date/Time   NA 137 06/16/2021 0802    K 3.4 (L) 06/16/2021 0802   CL 102 06/16/2021 0802   CO2 26 06/16/2021 0802   BUN 12 06/16/2021 0802   CREATININE 1.01 (H) 06/16/2021 0802   CREATININE 0.66 01/02/2013 1557      Component Value Date/Time   CALCIUM 8.7 (L) 06/16/2021 0802   ALKPHOS 39 06/16/2021 0802  AST 18 06/16/2021 0802   ALT 16 06/16/2021 0802   BILITOT 0.4 06/16/2021 0802      Impression and Plan: Christine Duncan is a very charming 41 year old white female.  She has lymphomatoid papulosis.  This was biopsied and diagnosed.  She is on oral methotrexate - weekly.   We will go ahead and increase the dose up to 20 mg a week.  These will be 10 mg pills now.  As such, she will take 2 pills weekly.  I would like to get her back in 6 weeks.  Hopefully, we will see that her outbreaks are lessened.  I would hate to have to go to actual chemotherapy for her cutaneous issue.  However, we certainly can use that if necessary.  I will see her back in 6 weeks.    Volanda Napoleon, MD 9/7/20238:13 AM

## 2021-09-22 NOTE — Telephone Encounter (Signed)
Per 09/22/21 los - gave upcoming appointments - confirmed

## 2021-10-11 ENCOUNTER — Ambulatory Visit: Payer: Self-pay | Admitting: Radiology

## 2021-10-11 DIAGNOSIS — Z0289 Encounter for other administrative examinations: Secondary | ICD-10-CM

## 2021-10-24 ENCOUNTER — Encounter: Payer: Self-pay | Admitting: Hematology & Oncology

## 2021-10-25 ENCOUNTER — Ambulatory Visit: Payer: BC Managed Care – PPO | Admitting: Family Medicine

## 2021-10-25 ENCOUNTER — Encounter: Payer: Self-pay | Admitting: Family Medicine

## 2021-10-25 ENCOUNTER — Other Ambulatory Visit: Payer: Self-pay

## 2021-10-25 VITALS — BP 132/86 | HR 106 | Temp 97.8°F | Ht 67.5 in | Wt 170.0 lb

## 2021-10-25 DIAGNOSIS — I10 Essential (primary) hypertension: Secondary | ICD-10-CM | POA: Diagnosis not present

## 2021-10-25 DIAGNOSIS — E538 Deficiency of other specified B group vitamins: Secondary | ICD-10-CM

## 2021-10-25 DIAGNOSIS — E0789 Other specified disorders of thyroid: Secondary | ICD-10-CM | POA: Diagnosis not present

## 2021-10-25 DIAGNOSIS — E049 Nontoxic goiter, unspecified: Secondary | ICD-10-CM | POA: Diagnosis not present

## 2021-10-25 DIAGNOSIS — J029 Acute pharyngitis, unspecified: Secondary | ICD-10-CM | POA: Diagnosis not present

## 2021-10-25 DIAGNOSIS — J069 Acute upper respiratory infection, unspecified: Secondary | ICD-10-CM | POA: Diagnosis not present

## 2021-10-25 DIAGNOSIS — C866 Primary cutaneous CD30-positive T-cell proliferations: Secondary | ICD-10-CM

## 2021-10-25 LAB — CBC WITH DIFFERENTIAL/PLATELET
Basophils Absolute: 0.1 10*3/uL (ref 0.0–0.1)
Basophils Relative: 0.9 % (ref 0.0–3.0)
Eosinophils Absolute: 0 10*3/uL (ref 0.0–0.7)
Eosinophils Relative: 0.2 % (ref 0.0–5.0)
HCT: 37.8 % (ref 36.0–46.0)
Hemoglobin: 12.8 g/dL (ref 12.0–15.0)
Lymphocytes Relative: 29 % (ref 12.0–46.0)
Lymphs Abs: 2.5 10*3/uL (ref 0.7–4.0)
MCHC: 34 g/dL (ref 30.0–36.0)
MCV: 87.2 fl (ref 78.0–100.0)
Monocytes Absolute: 0.6 10*3/uL (ref 0.1–1.0)
Monocytes Relative: 6.6 % (ref 3.0–12.0)
Neutro Abs: 5.4 10*3/uL (ref 1.4–7.7)
Neutrophils Relative %: 63.3 % (ref 43.0–77.0)
Platelets: 344 10*3/uL (ref 150.0–400.0)
RBC: 4.33 Mil/uL (ref 3.87–5.11)
RDW: 14.8 % (ref 11.5–15.5)
WBC: 8.5 10*3/uL (ref 4.0–10.5)

## 2021-10-25 LAB — COMPREHENSIVE METABOLIC PANEL
ALT: 12 U/L (ref 0–35)
AST: 15 U/L (ref 0–37)
Albumin: 4.1 g/dL (ref 3.5–5.2)
Alkaline Phosphatase: 51 U/L (ref 39–117)
BUN: 7 mg/dL (ref 6–23)
CO2: 25 mEq/L (ref 19–32)
Calcium: 9.2 mg/dL (ref 8.4–10.5)
Chloride: 101 mEq/L (ref 96–112)
Creatinine, Ser: 0.82 mg/dL (ref 0.40–1.20)
GFR: 87.66 mL/min (ref >60.00)
Glucose, Bld: 89 mg/dL (ref 70–99)
Potassium: 3.7 mEq/L (ref 3.5–5.1)
Sodium: 136 mEq/L (ref 135–145)
Total Bilirubin: 0.7 mg/dL (ref 0.2–1.2)
Total Protein: 7.4 g/dL (ref 6.0–8.3)

## 2021-10-25 LAB — TSH: TSH: 0.63 u[IU]/mL (ref 0.35–5.50)

## 2021-10-25 MED ORDER — METHOTREXATE 2.5 MG PO TABS: 20.0000 mg | ORAL_TABLET | ORAL | 8 refills | Status: DC

## 2021-10-25 NOTE — Progress Notes (Signed)
Subjective:     Patient ID: Christine Duncan, female    DOB: 11-Dec-1980, 41 y.o.   MRN: [id number]  Chief Complaint  Patient presents with   Headache    Headache whenever BP is up, sees floaters and has hazy vision   Sore Throat    Tonsil stones first noticed today but sore throat started Sunday COVID test daily negative   Hypertension    BP up whenever she has headache, no headache today    HPI Patient is in today for a 3 day hx of sore throat. Hx of tonsil stones.   Left sided throat pain with left anterior neck tenderness.  Low grade fever and headache.  Mild cough, rhinorrhea and sneezing.   No chills, body aches, dizziness, vision changes, chest pain, palpitations, shortness of breath, N/V/D  Taking Tylenol and Zyrtec.   Negative Covid tests at home.  Works in assisted living.   Methotrexate dose was increased last month.  She is under the care of Dr. Marin Olp for lymphomatoid papulosis  BP has been elevated recently and she notices headache when her BP is elevated.  Today no headache.   BP fluctuates at home. 130s-160s/90s.   Hx of HTN with pre eclampsia in pregnancy.   Family hx of thyroid disorders.     Health Maintenance Due  Topic Date Due   COLONOSCOPY (Pts 45-10yr Insurance coverage will need to be confirmed)  09/05/2016   INFLUENZA VACCINE  08/16/2021    Past Medical History:  Diagnosis Date   Acid reflux    Allergy    Anemia    Anxiety    B12 deficiency    Bipolar 1 disorder ([facility]    Blood type, Rh negative    Cervical dysplasia    Depression    Essential tremor    Fibromyalgia    Goals of care, counseling/discussion 12/07/2020   Hives    IBS (irritable bowel syndrome)    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[facility]    Neuromuscular disorder ([facility]    fibromylagia   Spastic colon     Past Surgical History:  Procedure Laterality Date   CERVICAL BIOPSY  W/ LOOP ELECTRODE EXCISION     CESAREAN SECTION  2006   CESAREAN SECTION  2011   COLPOSCOPY      DILATION AND CURETTAGE OF UTERUS  2005   INTRAUTERINE DEVICE INSERTION  05/09/2016   LAPAROSCOPIC APPENDECTOMY N/A 08/19/2016   Procedure: APPENDECTOMY LAPAROSCOPIC;  Surgeon: NAlphonsa Overall MD;  Location: [facility];  Service: General;  Laterality: N/A;   LEEP  07/2010   CIN II, Margins free    Family History  Problem Relation Age of Onset   Thyroid disease Mother    Depression Mother    Cancer Father 579      esoph ca   Esophageal cancer Father    Allergic rhinitis Brother    Asthma Paternal Aunt    Allergic rhinitis Paternal Aunt    Seizures Maternal Grandmother    Thyroid disease Maternal Grandmother    Depression Maternal Grandmother    Diabetes Maternal Grandmother    Bipolar disorder Maternal Grandfather    Alcohol abuse Maternal Grandfather    Drug abuse Maternal Grandfather    Hyperlipidemia Paternal Grandmother    Diabetes Paternal Grandmother    Breast cancer Paternal Grandmother 562  Cancer Paternal Grandmother        Bladder, colon cancer, breast, liver cancer   Colon cancer Paternal Grandmother    Bipolar disorder  Paternal Grandmother    Stroke Paternal Grandmother    Diabetes Paternal Grandfather    Rectal cancer Neg Hx    Stomach cancer Neg Hx     Social History   Socioeconomic History   Marital status: Married    Spouse name: Not on file   Number of children: Not on file   Years of education: Not on file   Highest education level: Not on file  Occupational History   Occupation: WOR[employer]    Employer: [employer]    Comment: [employer]  Tobacco Use   Smoking status: Former    Packs/day: 1.00    Types: E-cigarettes, Cigarettes   Smokeless tobacco: Never   Tobacco comments:    Pt Vapes everyday.  Vaping Use   Vaping Use: Every day   Substances: Nicotine, Flavoring  Substance and Sexual Activity   Alcohol use: Yes    Alcohol/week: 0.0 standard drinks of alcohol    Comment: Rare   Drug use: No   Sexual activity: Yes    Partners: Male     Birth control/protection: None    Comment: 1st intercourse 41 yo-Fewer than 5 partners Mirena 05/09/2016   Other Topics Concern   Not on file  Social History Narrative   Regular exercise- yes   Social Determinants of Health   Financial Resource Strain: Not on file  Food Insecurity: Not on file  Transportation Needs: Not on file  Physical Activity: Not on file  Stress: Not on file  Social Connections: Not on file  Intimate Partner Violence: Not on file    Outpatient Medications Prior to Visit  Medication Sig Dispense Refill   amphetamine-dextroamphetamine (ADDERALL XR) 20 MG 24 hr capsule Take 20 mg by mouth every morning.     amphetamine-dextroamphetamine (ADDERALL) 20 MG tablet Take 20 mg by mouth daily as needed (to focus).   0   cetirizine (ZYRTEC) 10 MG tablet Take 10 mg by mouth daily.     clonazePAM (KLONOPIN) 0.5 MG disintegrating tablet Take 1 mg by mouth 3 (three) times daily as needed (for anxiety).     cyanocobalamin (,VITAMIN B-12,) 1000 MCG/ML injection Give 1000 mcg of Vitamin b12 sq daily x 1 week, then weekly for 1 month, then once every 2 weeks 10 mL 6   cyclobenzaprine (FLEXERIL) 5 MG tablet Take 1 tablet (5 mg total) by mouth 3 (three) times daily as needed for muscle spasms. 30 tablet 1   JUNEL 1/20 1-20 MG-MCG tablet TAKE 1 TABLET BY MOUTH EVERY DAY 21 tablet 0   lamoTRIgine (LAMICTAL) 100 MG tablet Take 100 mg by mouth daily.     methotrexate (RHEUMATREX) 2.5 MG tablet Take 8 tablets (20 mg total) by mouth once a week. Caution:Chemotherapy. Protect from light. 24 tablet 8   norethindrone-ethinyl estradiol-FE (JUNEL FE 1/20) 1-20 MG-MCG tablet Take 1 tablet by mouth daily. Skip placebo week, take continuously 84 tablet 4   omeprazole (PRILOSEC) 40 MG capsule Take 1 capsule (40 mg total) by mouth daily. 90 capsule 3   ondansetron (ZOFRAN) 8 MG tablet Take 1 tablet (8 mg total) by mouth every 8 (eight) hours as needed. 30 tablet 2   SYRINGE-NEEDLE, DISP, 3 ML (BD  ECLIPSE SYRINGE) 25G X 1" 3 ML MISC Give 1000 mcg of Vitamin b12 sq daily x 1 week, then weekly for 1 month, then once every 2 weeks 50 each 3   temazepam (RESTORIL) 15 MG capsule Take 15 mg by mouth at bedtime.  triamcinolone cream (KENALOG) 0.1 % Apply topically 2 (two) times daily as needed. 30 g 1   Vitamin D, Ergocalciferol, (DRISDOL) 1.25 MG (50000 UNIT) CAPS capsule Take 1 capsule (50,000 Units total) by mouth every 7 (seven) days. (Patient not taking: Reported on 10/25/2021) 8 capsule 0   fluconazole (DIFLUCAN) 100 MG tablet Take 1 tablet (100 mg total) by mouth daily. 3 tablet 0   No facility-administered medications prior to visit.    Allergies  Allergen Reactions   Sertraline Hcl Other (See Comments)    Reaction:  Manic episode   Carbidopa-Levodopa Nausea And Vomiting    ROS     Objective:    Physical Exam Constitutional:      General: She is not in acute distress.    Appearance: She is not ill-appearing.  HENT:     Right Ear: Tympanic membrane and ear canal normal.     Left Ear: Tympanic membrane and ear canal normal.     Nose: No congestion.     Mouth/Throat:     Mouth: Mucous membranes are moist.     Pharynx: Oropharynx is clear.     Tonsils: No tonsillar exudate or tonsillar abscesses.  Eyes:     Extraocular Movements: Extraocular movements intact.     Conjunctiva/sclera: Conjunctivae normal.     Pupils: Pupils are equal, round, and reactive to light.  Neck:     Thyroid: Thyromegaly and thyroid tenderness present.     Trachea: Trachea normal.     Comments: Left sided thyroid tenderness to palpation with enlargement  Cardiovascular:     Rate and Rhythm: Normal rate and regular rhythm.  Pulmonary:     Effort: Pulmonary effort is normal.     Breath sounds: Normal breath sounds.  Musculoskeletal:     Cervical back: Normal range of motion and neck supple.  Lymphadenopathy:     Cervical: No cervical adenopathy.     Right cervical: No superficial cervical  adenopathy.    Left cervical: No superficial cervical adenopathy.  Skin:    General: Skin is warm and dry.  Neurological:     General: No focal deficit present.     Mental Status: She is alert and oriented to person, place, and time.     Cranial Nerves: No cranial nerve deficit or facial asymmetry.     Sensory: No sensory deficit.     Motor: No weakness.  Psychiatric:        Mood and Affect: Mood normal.        Behavior: Behavior normal.     BP 132/86 (BP Location: Left Arm, Patient Position: Sitting, Cuff Size: Large)   Pulse (!) 106   Temp 97.8 F (36.6 C) (Temporal)   Ht 5' 7.5" (1.715 m)   Wt 170 lb (77.1 kg)   SpO2 100%   BMI 26.23 kg/m  Wt Readings from Last 3 Encounters:  10/25/21 170 lb (77.1 kg)  09/22/21 170 lb 6.4 oz (77.3 kg)  07/06/21 170 lb (77.1 kg)       Assessment & Plan:   Problem List Items Addressed This Visit       Cardiovascular and Mediastinum   Essential hypertension    Diet controlled although elevated recently. Continue monitoring BP at home and follow up with readings from home in 2-3 week with PCP      Relevant Orders   CBC with Differential/Platelet   Comprehensive metabolic panel     Respiratory   Acute pharyngitis    Oropharyngeal  exam is fairly unremarkable.  Throat tenderness consistent with left-sided thyroid      Viral URI with cough    Continue symptomatic management        Endocrine   Enlarged thyroid gland    Check labs including thyroid function and thyroid ultrasound ordered.      Relevant Orders   TSH   T4, free   US THYROID     Other   B12 deficiency   Relevant Orders   Vitamin B12   Painful thyroid - Primary    Left-sided thyroid tenderness on exam.  No difficulty swallowing.  She does have sore throat on the left which may be due to enlarged thyroid.  Ultrasound ordered.  Labs ordered.      Relevant Orders   CBC with Differential/Platelet   Comprehensive metabolic panel   TSH   T4, free   US  THYROID    I have discontinued Nateisha K. Prabhu's fluconazole. I am also having her maintain her lamoTRIgine, amphetamine-dextroamphetamine, cetirizine, clonazePAM, temazepam, omeprazole, cyclobenzaprine, Vitamin D (Ergocalciferol), BD Eclipse Syringe, cyanocobalamin, ondansetron, Junel 1/20, triamcinolone cream, amphetamine-dextroamphetamine, norethindrone-ethinyl estradiol-FE, and methotrexate.  No orders of the defined types were placed in this encounter.

## 2021-10-25 NOTE — Assessment & Plan Note (Signed)
Check labs including thyroid function and thyroid ultrasound ordered.

## 2021-10-25 NOTE — Patient Instructions (Addendum)
Please go downstairs for labs before you leave today.  I have ordered an ultrasound of your thyroid gland.  You will receive a call from [facility] to schedule this.  You may take ibuprofen 600 mg with food 2 or 3 times daily.  You can also take Tylenol if needed.  Follow-up if you are having any new or worsening symptoms.  Continue to monitor your blood pressure at home.  Please schedule a 2 to 4-week follow-up with Dr. Alain Marion

## 2021-10-25 NOTE — Assessment & Plan Note (Signed)
Left-sided thyroid tenderness on exam.  No difficulty swallowing.  She does have sore throat on the left which may be due to enlarged thyroid.  Ultrasound ordered.  Labs ordered.

## 2021-10-25 NOTE — Assessment & Plan Note (Signed)
Oropharyngeal exam is fairly unremarkable.  Throat tenderness consistent with left-sided thyroid

## 2021-10-25 NOTE — Assessment & Plan Note (Signed)
Diet controlled although elevated recently. Continue monitoring BP at home and follow up with readings from home in 2-3 week with PCP

## 2021-10-25 NOTE — Assessment & Plan Note (Signed)
Continue symptomatic management

## 2021-10-26 ENCOUNTER — Ambulatory Visit
Admission: RE | Admit: 2021-10-26 | Discharge: 2021-10-26 | Disposition: A | Discharge status: Home / Self Care | Payer: BC Managed Care – PPO | Source: Ambulatory Visit | Attending: Family Medicine | Admitting: Family Medicine

## 2021-10-26 DIAGNOSIS — E0789 Other specified disorders of thyroid: Secondary | ICD-10-CM

## 2021-10-26 DIAGNOSIS — E042 Nontoxic multinodular goiter: Secondary | ICD-10-CM | POA: Diagnosis not present

## 2021-10-26 DIAGNOSIS — E049 Nontoxic goiter, unspecified: Secondary | ICD-10-CM

## 2021-10-26 LAB — T4, FREE: Free T4: 0.82 ng/dL (ref 0.60–1.60)

## 2021-10-26 LAB — VITAMIN B12: Vitamin B-12: 1293 pg/mL — ABNORMAL HIGH (ref 211–911)

## 2021-11-03 ENCOUNTER — Encounter: Payer: Self-pay | Admitting: Hematology & Oncology

## 2021-11-03 ENCOUNTER — Inpatient Hospital Stay: Payer: BC Managed Care – PPO | Attending: Hematology & Oncology

## 2021-11-03 ENCOUNTER — Other Ambulatory Visit: Payer: Self-pay | Admitting: Hematology & Oncology

## 2021-11-03 ENCOUNTER — Inpatient Hospital Stay (HOSPITAL_BASED_OUTPATIENT_CLINIC_OR_DEPARTMENT_OTHER): Payer: BC Managed Care – PPO | Admitting: Hematology & Oncology

## 2021-11-03 ENCOUNTER — Other Ambulatory Visit: Payer: Self-pay

## 2021-11-03 VITALS — BP 137/89 | HR 93 | Temp 98.5°F | Resp 16 | Ht 67.5 in | Wt 170.0 lb

## 2021-11-03 DIAGNOSIS — R140 Abdominal distension (gaseous): Secondary | ICD-10-CM | POA: Diagnosis not present

## 2021-11-03 DIAGNOSIS — C866 Primary cutaneous CD30-positive T-cell proliferations: Secondary | ICD-10-CM

## 2021-11-03 DIAGNOSIS — Z79899 Other long term (current) drug therapy: Secondary | ICD-10-CM | POA: Diagnosis not present

## 2021-11-03 LAB — CMP (CANCER CENTER ONLY)
ALT: 15 U/L (ref 0–44)
AST: 16 U/L (ref 15–41)
Albumin: 4.1 g/dL (ref 3.5–5.0)
Alkaline Phosphatase: 45 U/L (ref 38–126)
Anion gap: 8 (ref 5–15)
BUN: 10 mg/dL (ref 6–20)
CO2: 26 mmol/L (ref 22–32)
Calcium: 9 mg/dL (ref 8.9–10.3)
Chloride: 104 mmol/L (ref 98–111)
Creatinine: 0.97 mg/dL (ref 0.44–1.00)
GFR, Estimated: >60 mL/min (ref >60)
Glucose, Bld: 97 mg/dL (ref 70–99)
Potassium: 4.1 mmol/L (ref 3.5–5.1)
Sodium: 138 mmol/L (ref 135–145)
Total Bilirubin: 0.6 mg/dL (ref 0.3–1.2)
Total Protein: 6.8 g/dL (ref 6.5–8.1)

## 2021-11-03 LAB — CBC WITH DIFFERENTIAL (CANCER CENTER ONLY)
Abs Immature Granulocytes: 0.01 10*3/uL (ref 0.00–0.07)
Basophils Absolute: 0 10*3/uL (ref 0.0–0.1)
Basophils Relative: 1 %
Eosinophils Absolute: 0 10*3/uL (ref 0.0–0.5)
Eosinophils Relative: 1 %
HCT: 36.1 % (ref 36.0–46.0)
Hemoglobin: 12.3 g/dL (ref 12.0–15.0)
Immature Granulocytes: 0 %
Lymphocytes Relative: 38 %
Lymphs Abs: 1.7 10*3/uL (ref 0.7–4.0)
MCH: 29.8 pg (ref 26.0–34.0)
MCHC: 34.1 g/dL (ref 30.0–36.0)
MCV: 87.4 fL (ref 80.0–100.0)
Monocytes Absolute: 0.3 10*3/uL (ref 0.1–1.0)
Monocytes Relative: 8 %
Neutro Abs: 2.3 10*3/uL (ref 1.7–7.7)
Neutrophils Relative %: 52 %
Platelet Count: 349 10*3/uL (ref 150–400)
RBC: 4.13 MIL/uL (ref 3.87–5.11)
RDW: 13.6 % (ref 11.5–15.5)
WBC Count: 4.4 10*3/uL (ref 4.0–10.5)
nRBC: 0 % (ref 0.0–0.2)

## 2021-11-03 LAB — LACTATE DEHYDROGENASE: LDH: 140 U/L (ref 98–192)

## 2021-11-03 MED ORDER — METHOTREXATE SODIUM 5 MG PO TABS: 25.0000 mg | ORAL_TABLET | ORAL | 1 refills | Status: DC

## 2021-11-03 NOTE — Progress Notes (Signed)
Hematology and Oncology Follow Up Visit  KAYDE ATKERSON [id number] December 22, 1980 41 y.o. 11/03/2021   Principle Diagnosis:  Lymphomatoid papulosis  Current Therapy:   Methotrexate 25 mg p.o. weekly -- changed on 11/03/2021     Interim History:  Ms. Dildine is back for follow-up.  We increased her methotrexate the last time that we saw her.  Unfortunately, she still is having some outbreaks.  I will see about increasing the methotrexate up to 25 mg a week.  Maybe, this will help.   She says that the outbreaks are not as long.  Did not leave scars.  Otherwise she feels okay.  She said that she had little bit more bloating when we increase the methotrexate dose.  She takes some over-the-counter agent for this.  She has had no problems with the bleeding.  She does have her monthly cycles.  This is relatively stable.  She has had no fever.  She has had no cough or shortness of breath.  She has had no headache.  She is still working.  She is quite busy at work.  Currently, I would say performance status is probably ECOG 1.     Medications:  Current Outpatient Medications:    amphetamine-dextroamphetamine (ADDERALL XR) 20 MG 24 hr capsule, Take 20 mg by mouth every morning., Disp: , Rfl:    amphetamine-dextroamphetamine (ADDERALL) 20 MG tablet, Take 20 mg by mouth daily as needed (to focus). , Disp: , Rfl: 0   cetirizine (ZYRTEC) 10 MG tablet, Take 10 mg by mouth daily., Disp: , Rfl:    clonazePAM (KLONOPIN) 0.5 MG disintegrating tablet, Take 1 mg by mouth 3 (three) times daily as needed (for anxiety)., Disp: , Rfl:    cyanocobalamin (,VITAMIN B-12,) 1000 MCG/ML injection, Give 1000 mcg of Vitamin b12 sq daily x 1 week, then weekly for 1 month, then once every 2 weeks, Disp: 10 mL, Rfl: 6   cyclobenzaprine (FLEXERIL) 5 MG tablet, Take 1 tablet (5 mg total) by mouth 3 (three) times daily as needed for muscle spasms., Disp: 30 tablet, Rfl: 1   JUNEL 1/20 1-20 MG-MCG tablet, TAKE 1 TABLET BY  MOUTH EVERY DAY, Disp: 21 tablet, Rfl: 0   lamoTRIgine (LAMICTAL) 100 MG tablet, Take 100 mg by mouth daily., Disp: , Rfl:    methotrexate (RHEUMATREX) 2.5 MG tablet, Take 8 tablets (20 mg total) by mouth once a week. Caution:Chemotherapy. Protect from light., Disp: 24 tablet, Rfl: 8   norethindrone-ethinyl estradiol-FE (JUNEL FE 1/20) 1-20 MG-MCG tablet, Take 1 tablet by mouth daily. Skip placebo week, take continuously, Disp: 84 tablet, Rfl: 4   omeprazole (PRILOSEC) 40 MG capsule, Take 1 capsule (40 mg total) by mouth daily., Disp: 90 capsule, Rfl: 3   ondansetron (ZOFRAN) 8 MG tablet, Take 1 tablet (8 mg total) by mouth every 8 (eight) hours as needed., Disp: 30 tablet, Rfl: 2   SYRINGE-NEEDLE, DISP, 3 ML (BD ECLIPSE SYRINGE) 25G X 1" 3 ML MISC, Give 1000 mcg of Vitamin b12 sq daily x 1 week, then weekly for 1 month, then once every 2 weeks, Disp: 50 each, Rfl: 3   temazepam (RESTORIL) 15 MG capsule, Take 15 mg by mouth at bedtime., Disp: , Rfl:    triamcinolone cream (KENALOG) 0.1 %, Apply topically 2 (two) times daily as needed., Disp: 30 g, Rfl: 1   Vitamin D, Ergocalciferol, (DRISDOL) 1.25 MG (50000 UNIT) CAPS capsule, Take 1 capsule (50,000 Units total) by mouth every 7 (seven) days. (Patient not taking:  Reported on 10/25/2021), Disp: 8 capsule, Rfl: 0  Allergies:  Allergies  Allergen Reactions   Sertraline Hcl Other (See Comments)    Reaction:  Manic episode   Carbidopa-Levodopa Nausea And Vomiting    Past Medical History, Surgical history, Social history, and Family History were reviewed and updated.  Review of Systems: Review of Systems  Constitutional: Negative.   HENT:  Negative.    Eyes: Negative.   Respiratory: Negative.    Cardiovascular: Negative.   Gastrointestinal: Negative.   Endocrine: Negative.   Genitourinary: Negative.    Musculoskeletal: Negative.   Skin:  Positive for rash.  Hematological: Negative.   Psychiatric/Behavioral: Negative.      Physical  Exam:  vitals were not taken for this visit.   Wt Readings from Last 3 Encounters:  10/25/21 170 lb (77.1 kg)  09/22/21 170 lb 6.4 oz (77.3 kg)  07/06/21 170 lb (77.1 kg)    Physical Exam Vitals reviewed.  HENT:     Head: Normocephalic and atraumatic.  Eyes:     Pupils: Pupils are equal, round, and reactive to light.  Cardiovascular:     Rate and Rhythm: Normal rate and regular rhythm.     Heart sounds: Normal heart sounds.  Pulmonary:     Effort: Pulmonary effort is normal.     Breath sounds: Normal breath sounds.  Abdominal:     General: Bowel sounds are normal.     Palpations: Abdomen is soft.  Musculoskeletal:        General: No tenderness or deformity. Normal range of motion.     Cervical back: Normal range of motion.  Lymphadenopathy:     Cervical: No cervical adenopathy.  Skin:    General: Skin is warm and dry.     Findings: No erythema or rash.     Comments: She does have the papular lesions.  They seem to be more dry.  Some appear to be less prominent.  Neurological:     Mental Status: She is alert and oriented to person, place, and time.  Psychiatric:        Behavior: Behavior normal.        Thought Content: Thought content normal.        Judgment: Judgment normal.     Lab Results  Component Value Date   WBC 4.4 11/03/2021   HGB 12.3 11/03/2021   HCT 36.1 11/03/2021   MCV 87.4 11/03/2021   PLT 349 11/03/2021     Chemistry      Component Value Date/Time   NA 136 10/25/2021 1141   K 3.7 10/25/2021 1141   CL 101 10/25/2021 1141   CO2 25 10/25/2021 1141   BUN 7 10/25/2021 1141   CREATININE 0.82 10/25/2021 1141   CREATININE 1.01 (H) 09/22/2021 0800   CREATININE 0.66 01/02/2013 1557      Component Value Date/Time   CALCIUM 9.2 10/25/2021 1141   ALKPHOS 51 10/25/2021 1141   AST 15 10/25/2021 1141   AST 15 09/22/2021 0800   ALT 12 10/25/2021 1141   ALT 12 09/22/2021 0800   BILITOT 0.7 10/25/2021 1141   BILITOT 0.4 09/22/2021 0800       Impression and Plan: Ms. Sonnier is a very charming 41 year old white female.  She has lymphomatoid papulosis.  This was biopsied and diagnosed.  She is on oral methotrexate - weekly.   We will make another dose increase on her.  Hopefully, this will help.  If not, then the next therapy would be phototherapy  using PUV[employer].  [facility]  We will go ahead and increase up to 25 mg a week.  I will plan to get her back in about a month or so.  Hopefully, we will see that this increased dose will be helping.     Volanda Napoleon, MD 10/19/20238:05 AM

## 2021-11-04 ENCOUNTER — Other Ambulatory Visit: Payer: Self-pay | Admitting: Hematology & Oncology

## 2021-11-05 ENCOUNTER — Other Ambulatory Visit: Payer: Self-pay | Admitting: Hematology & Oncology

## 2021-11-07 ENCOUNTER — Other Ambulatory Visit: Payer: Self-pay | Admitting: Hematology & Oncology

## 2021-11-08 ENCOUNTER — Encounter: Payer: Self-pay | Admitting: Hematology & Oncology

## 2021-11-10 ENCOUNTER — Encounter: Payer: Self-pay | Admitting: Internal Medicine

## 2021-11-10 ENCOUNTER — Ambulatory Visit: Payer: BC Managed Care – PPO | Admitting: Internal Medicine

## 2021-11-10 VITALS — BP 128/80 | HR 93 | Temp 98.7°F | Ht 67.5 in | Wt 170.6 lb

## 2021-11-10 DIAGNOSIS — G250 Essential tremor: Secondary | ICD-10-CM | POA: Diagnosis not present

## 2021-11-10 DIAGNOSIS — Z23 Encounter for immunization: Secondary | ICD-10-CM | POA: Diagnosis not present

## 2021-11-10 DIAGNOSIS — G253 Myoclonus: Secondary | ICD-10-CM

## 2021-11-10 DIAGNOSIS — C866 Primary cutaneous CD30-positive T-cell proliferations: Secondary | ICD-10-CM | POA: Diagnosis not present

## 2021-11-10 DIAGNOSIS — R030 Elevated blood-pressure reading, without diagnosis of hypertension: Secondary | ICD-10-CM

## 2021-11-10 MED ORDER — PROPRANOLOL HCL ER 60 MG PO CP24: 60.0000 mg | ORAL_CAPSULE | Freq: Every day | ORAL | 3 refills | Status: DC

## 2021-11-10 NOTE — Assessment & Plan Note (Signed)
Start Propranolol LA for BP and tremors

## 2021-11-10 NOTE — Assessment & Plan Note (Signed)
Skin rash -  lymphomatoid papulosis Seeing Dr Marin Olp. On MTX

## 2021-11-10 NOTE — Assessment & Plan Note (Addendum)
Worse Neurology ref - Dr Carles Collet

## 2021-11-10 NOTE — Assessment & Plan Note (Signed)
Worse Will try Propranolol

## 2021-11-10 NOTE — Progress Notes (Signed)
Subjective:  Patient ID: Christine Duncan, female    DOB: 1980/09/17  Age: 41 y.o. MRN: [id number]  CC: Follow-up ([facility])   HPI NAVI EWTON presents for elevated BP C/o tremors all over at times, clonus, biting lips,tongue  -  worse  Outpatient Medications Prior to Visit  Medication Sig Dispense Refill   amphetamine-dextroamphetamine (ADDERALL XR) 20 MG 24 hr capsule Take 20 mg by mouth every morning.     cetirizine (ZYRTEC) 10 MG tablet Take 10 mg by mouth daily.     clonazePAM (KLONOPIN) 0.5 MG disintegrating tablet Take 1 mg by mouth 3 (three) times daily as needed (for anxiety).     cyanocobalamin (,VITAMIN B-12,) 1000 MCG/ML injection Give 1000 mcg of Vitamin b12 sq daily x 1 week, then weekly for 1 month, then once every 2 weeks 10 mL 6   cyclobenzaprine (FLEXERIL) 5 MG tablet Take 1 tablet (5 mg total) by mouth 3 (three) times daily as needed for muscle spasms. 30 tablet 1   lamoTRIgine (LAMICTAL) 100 MG tablet Take 100 mg by mouth daily.     methotrexate (RHEUMATREX) 2.5 MG tablet Take by mouth. Take 5 TABLETS by mouth WEEKLY     norethindrone-ethinyl estradiol-FE (JUNEL FE 1/20) 1-20 MG-MCG tablet Take 1 tablet by mouth daily. Skip placebo week, take continuously 84 tablet 4   omeprazole (PRILOSEC) 40 MG capsule Take 1 capsule (40 mg total) by mouth daily. 90 capsule 3   ondansetron (ZOFRAN) 8 MG tablet Take 1 tablet (8 mg total) by mouth every 8 (eight) hours as needed. 30 tablet 2   SYRINGE-NEEDLE, DISP, 3 ML (BD ECLIPSE SYRINGE) 25G X 1" 3 ML MISC Give 1000 mcg of Vitamin b12 sq daily x 1 week, then weekly for 1 month, then once every 2 weeks 50 each 3   temazepam (RESTORIL) 15 MG capsule Take 15 mg by mouth at bedtime.     triamcinolone cream (KENALOG) 0.1 % Apply topically 2 (two) times daily as needed. 30 g 1   amphetamine-dextroamphetamine (ADDERALL) 20 MG tablet Take 20 mg by mouth daily as needed (to focus).  (Patient not taking: Reported on 11/10/2021)  0   JUNEL  1/20 1-20 MG-MCG tablet TAKE 1 TABLET BY MOUTH EVERY DAY (Patient not taking: Reported on 11/10/2021) 21 tablet 0   TREXALL 5 MG tablet TAKE 5 TABLETS BY MOUTH ONE TIME PER WEEK (Patient not taking: Reported on 11/10/2021) 20 tablet 1   Vitamin D, Ergocalciferol, (DRISDOL) 1.25 MG (50000 UNIT) CAPS capsule Take 1 capsule (50,000 Units total) by mouth every 7 (seven) days. (Patient not taking: Reported on 10/25/2021) 8 capsule 0   No facility-administered medications prior to visit.    ROS: Review of Systems  Constitutional:  Negative for activity change, appetite change, chills, fatigue and unexpected weight change.  HENT:  Negative for congestion, mouth sores and sinus pressure.   Eyes:  Negative for visual disturbance.  Respiratory:  Negative for cough and chest tightness.   Gastrointestinal:  Negative for abdominal pain and nausea.  Genitourinary:  Negative for difficulty urinating, frequency and vaginal pain.  Musculoskeletal:  Negative for back pain and gait problem.  Skin:  Negative for pallor and rash.  Neurological:  Positive for tremors. Negative for dizziness, weakness, numbness and headaches.  Psychiatric/Behavioral:  Negative for confusion, sleep disturbance and suicidal ideas. The patient is nervous/anxious.     Objective:  BP 128/80 (BP Location: Left Arm)   Pulse 93   Temp 98.7  F (37.1 C) (Oral)   Ht 5' 7.5" (1.715 m)   Wt 170 lb 9.6 oz (77.4 kg)   SpO2 97%   BMI 26.33 kg/m   BP Readings from Last 3 Encounters:  11/10/21 128/80  11/03/21 137/89  10/25/21 132/86    Wt Readings from Last 3 Encounters:  11/10/21 170 lb 9.6 oz (77.4 kg)  11/03/21 170 lb (77.1 kg)  10/25/21 170 lb (77.1 kg)    Physical Exam  Lab Results  Component Value Date   WBC 4.4 11/03/2021   HGB 12.3 11/03/2021   HCT 36.1 11/03/2021   PLT 349 11/03/2021   GLUCOSE 97 11/03/2021   CHOL 262 (H) 11/10/2020   TRIG 115.0 11/10/2020   HDL 66.90 11/10/2020   LDLCALC 172 (H) 11/10/2020    ALT 15 11/03/2021   AST 16 11/03/2021   NA 138 11/03/2021   K 4.1 11/03/2021   CL 104 11/03/2021   CREATININE 0.97 11/03/2021   BUN 10 11/03/2021   CO2 26 11/03/2021   TSH 0.63 10/25/2021   HGBA1C 5.5 04/03/2017    US THYROID  Result Date: 10/27/2021 CLINICAL DATA:  painful and enlarged thyroid gland on left EXAM: THYROID ULTRASOUND TECHNIQUE: Ultrasound examination of the thyroid gland and adjacent soft tissues was performed. COMPARISON:  None Available. FINDINGS: Parenchymal Echotexture: Mildly heterogenous; normal parenchymal vascularity Isthmus: 0.4 cm Right lobe: 5.4 x 1.1 x 2.1 cm Left lobe: 6.0 x 1.8 x 2.3 cm _________________________________________________________ Estimated total number of nodules >/= 1 cm: 2 Number of spongiform nodules >/=  2 cm not described below (TR1): 0 Number of mixed cystic and solid nodules >/= 1.5 cm not described below (TR2): 0 _________________________________________________________ Nodule labeled 5 is an anechoic cystic TR 1 nodule in the superior left thyroid lobe measuring 1.8 cm. This nodule does NOT meet TI-RADS criteria for biopsy or dedicated follow-up. Nodule labeled 7 is a mixed cystic and solid isoechoic TR 2 nodule in the inferior left thyroid lobe measuring 1.2 cm. This nodule does NOT meet TI-RADS criteria for biopsy or dedicated follow-up. There are additional bilateral thyroid nodules measuring no more than 1 cm that do not meet criteria for further dedicated follow-up or biopsy. IMPRESSION: 1. Enlarged multinodular thyroid gland. Multiple small and/or benign-appearing thyroid nodules as described do not meet criteria for further dedicated follow-up or biopsy. The above is in keeping with the ACR TI-RADS recommendations - J Am Coll Radiol 2017;14:587-595. Electronically Signed   By: Albin Felling M.D.   On: 10/27/2021 10:06    Assessment & Plan:   Problem List Items Addressed This Visit     Elevated BP without diagnosis of hypertension     Start Propranolol LA for BP and tremors      Essential tremor    Worse Will try Propranolol       Relevant Orders   Ambulatory referral to Neurology   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[place])    Skin rash -  lymphomatoid papulosis Seeing Dr Marin Olp. On MTX      Myoclonus - Primary    Worse Neurology ref - Dr Tat      Relevant Orders   Ambulatory referral to Neurology      Meds ordered this encounter  Medications   propranolol ER (INDERAL LA) 60 MG 24 hr capsule    Sig: Take 1 capsule (60 mg total) by mouth daily.    Dispense:  30 capsule    Refill:  3      Follow-up: No follow-ups  on file.  Walker Kehr, MD

## 2021-11-28 DIAGNOSIS — F3176 Bipolar disorder, in full remission, most recent episode depressed: Secondary | ICD-10-CM | POA: Diagnosis not present

## 2021-12-13 ENCOUNTER — Ambulatory Visit: Payer: BC Managed Care – PPO | Admitting: Radiology

## 2021-12-13 VITALS — BP 112/78 | Ht 68.0 in | Wt 176.0 lb

## 2021-12-13 DIAGNOSIS — N943 Premenstrual tension syndrome: Secondary | ICD-10-CM

## 2021-12-13 DIAGNOSIS — Z3041 Encounter for surveillance of contraceptive pills: Secondary | ICD-10-CM | POA: Diagnosis not present

## 2021-12-13 NOTE — Progress Notes (Signed)
Christine Duncan 02-15-1980 [id number]   History:  41 y.o. here for follow up after starting OCPs. Moods and periods are better, BTB resolved  Gynecologic History  Obstetric History OB History  Gravida Para Term Preterm AB Living  5 2 2   3 2   SAB IAB Ectopic Multiple Live Births  3            # Outcome Date GA Lbr Len/2nd Weight Sex Delivery Anes PTL Lv  5 Term           4 Term           3 SAB           2 SAB           1 SAB              The following portions of the patient's history were reviewed and updated as appropriate: allergies, past family history, past medical history, past social history, past surgical history, and problem list.  Review of Systems A comprehensive review of systems was negative.   Past medical history, past surgical history, family history and social history were all reviewed and documented in the EPIC chart.   Exam:  Vitals:   07/06/21 1531  BP: 118/74  Weight: 170 lb (77.1 kg)   Body mass index is 26.23 kg/m.    Assessment/Plan:   1. PMS (premenstrual syndrome)  2. Encounter for surveillance of contraceptive pills Continue taking continuously   AEX 02/2022 Christine Duncan B WHNP-BC 4:18 PM 07/06/2021

## 2021-12-21 ENCOUNTER — Encounter: Payer: Self-pay | Admitting: Hematology & Oncology

## 2021-12-21 ENCOUNTER — Inpatient Hospital Stay: Payer: BC Managed Care – PPO | Attending: Hematology & Oncology

## 2021-12-21 ENCOUNTER — Inpatient Hospital Stay (HOSPITAL_BASED_OUTPATIENT_CLINIC_OR_DEPARTMENT_OTHER): Payer: BC Managed Care – PPO | Admitting: Hematology & Oncology

## 2021-12-21 VITALS — BP 132/87 | HR 76 | Temp 99.3°F | Resp 17 | Wt 175.1 lb

## 2021-12-21 DIAGNOSIS — C866 Primary cutaneous CD30-positive T-cell proliferations: Secondary | ICD-10-CM | POA: Diagnosis not present

## 2021-12-21 DIAGNOSIS — E538 Deficiency of other specified B group vitamins: Secondary | ICD-10-CM

## 2021-12-21 DIAGNOSIS — R251 Tremor, unspecified: Secondary | ICD-10-CM | POA: Insufficient documentation

## 2021-12-21 LAB — LACTATE DEHYDROGENASE: LDH: 160 U/L (ref 98–192)

## 2021-12-21 LAB — CBC WITH DIFFERENTIAL (CANCER CENTER ONLY)
Abs Immature Granulocytes: 0.02 10*3/uL (ref 0.00–0.07)
Basophils Absolute: 0.1 10*3/uL (ref 0.0–0.1)
Basophils Relative: 1 %
Eosinophils Absolute: 0.1 10*3/uL (ref 0.0–0.5)
Eosinophils Relative: 1 %
HCT: 37.5 % (ref 36.0–46.0)
Hemoglobin: 12.6 g/dL (ref 12.0–15.0)
Immature Granulocytes: 0 %
Lymphocytes Relative: 36 %
Lymphs Abs: 1.9 10*3/uL (ref 0.7–4.0)
MCH: 29.7 pg (ref 26.0–34.0)
MCHC: 33.6 g/dL (ref 30.0–36.0)
MCV: 88.4 fL (ref 80.0–100.0)
Monocytes Absolute: 0.4 10*3/uL (ref 0.1–1.0)
Monocytes Relative: 8 %
Neutro Abs: 2.8 10*3/uL (ref 1.7–7.7)
Neutrophils Relative %: 54 %
Platelet Count: 373 10*3/uL (ref 150–400)
RBC: 4.24 MIL/uL (ref 3.87–5.11)
RDW: 14.1 % (ref 11.5–15.5)
WBC Count: 5.1 10*3/uL (ref 4.0–10.5)
nRBC: 0 % (ref 0.0–0.2)

## 2021-12-21 LAB — CMP (CANCER CENTER ONLY)
ALT: 15 U/L (ref 0–44)
AST: 16 U/L (ref 15–41)
Albumin: 4.3 g/dL (ref 3.5–5.0)
Alkaline Phosphatase: 43 U/L (ref 38–126)
Anion gap: 9 (ref 5–15)
BUN: 9 mg/dL (ref 6–20)
CO2: 27 mmol/L (ref 22–32)
Calcium: 8.8 mg/dL — ABNORMAL LOW (ref 8.9–10.3)
Chloride: 105 mmol/L (ref 98–111)
Creatinine: 0.9 mg/dL (ref 0.44–1.00)
GFR, Estimated: >60 mL/min (ref >60)
Glucose, Bld: 107 mg/dL — ABNORMAL HIGH (ref 70–99)
Potassium: 3.9 mmol/L (ref 3.5–5.1)
Sodium: 141 mmol/L (ref 135–145)
Total Bilirubin: 0.4 mg/dL (ref 0.3–1.2)
Total Protein: 7.1 g/dL (ref 6.5–8.1)

## 2021-12-21 NOTE — Progress Notes (Signed)
Hematology and Oncology Follow Up Visit  Christine Duncan [id number] Mar 01, 1980 41 y.o. 12/21/2021   Principle Diagnosis:  Lymphomatoid papulosis  Current Therapy:   Methotrexate 25 mg p.o. weekly -- changed on 11/03/2021     Interim History:  Christine Duncan is back for follow-up.  So far, Christine Duncan is doing pretty well.  We increased the dose of methotrexate.  Christine Duncan said that Christine Duncan does not have as many outbreaks.  When Christine Duncan has an outbreak, it goes away fairly quickly.  Christine Duncan is worried about her weight gain.  Christine Duncan is getting about the 19 pounds since we saw her 41 y.o.  I am sure that her metabolism probably has slowed down a little bit.  Christine Duncan is still working.  Christine Duncan is quite busy at work.  Christine Duncan does not does not have any cycles.  Christine Duncan is on oral contraceptives for this.  Christine Duncan has had no cough or shortness of breath.  Christine Duncan has had no nausea or vomiting.  There is no change in bowel or bladder habits.  Christine Duncan has had no rashes.  Christine Duncan has had no bleeding.  Christine Duncan has had no leg swelling.  Christine Duncan does have some tremors.  Christine Duncan is on propranolol for this.  Overall, I would say that her performance status is probably ECOG 0.     Medications:  Current Outpatient Medications:    amphetamine-dextroamphetamine (ADDERALL XR) 20 MG 24 hr capsule, Take 20 mg by mouth every morning., Disp: , Rfl:    cetirizine (ZYRTEC) 10 MG tablet, Take 10 mg by mouth daily., Disp: , Rfl:    clonazePAM (KLONOPIN) 0.5 MG disintegrating tablet, Take 1 mg by mouth 3 (three) times daily as needed (for anxiety)., Disp: , Rfl:    cyanocobalamin (,VITAMIN B-12,) 1000 MCG/ML injection, Give 1000 mcg of Vitamin b12 sq daily x 1 week, then weekly for 1 month, then once every 2 weeks, Disp: 10 mL, Rfl: 6   cyclobenzaprine (FLEXERIL) 5 MG tablet, Take 1 tablet (5 mg total) by mouth 3 (three) times daily as needed for muscle spasms., Disp: 30 tablet, Rfl: 1   lamoTRIgine (LAMICTAL) 200 MG tablet, Take 200 mg by mouth daily., Disp: , Rfl:    methotrexate  (RHEUMATREX) 2.5 MG tablet, Take by mouth. Take 10 TABLETS by mouth WEEKLY, Disp: , Rfl:    norethindrone-ethinyl estradiol-FE (JUNEL FE 1/20) 1-20 MG-MCG tablet, Take 1 tablet by mouth daily. Skip placebo week, take continuously, Disp: 84 tablet, Rfl: 4   omeprazole (PRILOSEC) 40 MG capsule, Take 1 capsule (40 mg total) by mouth daily., Disp: 90 capsule, Rfl: 3   ondansetron (ZOFRAN) 8 MG tablet, Take 1 tablet (8 mg total) by mouth every 8 (eight) hours as needed., Disp: 30 tablet, Rfl: 2   propranolol ER (INDERAL LA) 60 MG 24 hr capsule, Take 1 capsule (60 mg total) by mouth daily., Disp: 30 capsule, Rfl: 3   SYRINGE-NEEDLE, DISP, 3 ML (BD ECLIPSE SYRINGE) 25G X 1" 3 ML MISC, Give 1000 mcg of Vitamin b12 sq daily x 1 week, then weekly for 1 month, then once every 2 weeks, Disp: 50 each, Rfl: 3   temazepam (RESTORIL) 15 MG capsule, Take 15 mg by mouth at bedtime., Disp: , Rfl:    triamcinolone cream (KENALOG) 0.1 %, Apply topically 2 (two) times daily as needed., Disp: 30 g, Rfl: 1   lamoTRIgine (LAMICTAL) 100 MG tablet, Take 100 mg by mouth daily. (Patient not taking: Reported on 12/21/2021), Disp: , Rfl:  Allergies:  Allergies  Allergen Reactions   Sertraline Hcl Other (See Comments)    Reaction:  Manic episode   Carbidopa-Levodopa Nausea And Vomiting    Past Medical History, Surgical history, Social history, and Family History were reviewed and updated.  Review of Systems: Review of Systems  Constitutional: Negative.   HENT:  Negative.    Eyes: Negative.   Respiratory: Negative.    Cardiovascular: Negative.   Gastrointestinal: Negative.   Endocrine: Negative.   Genitourinary: Negative.    Musculoskeletal: Negative.   Skin:  Positive for rash.  Hematological: Negative.   Psychiatric/Behavioral: Negative.      Physical Exam:  weight is 175 lb 1.9 oz (79.4 kg). Her oral temperature is 99.3 F (37.4 C). Her blood pressure is 132/87 and her pulse is 76. Her respiration is 17 and  oxygen saturation is 99%.   Wt Readings from Last 3 Encounters:  12/21/21 175 lb 1.9 oz (79.4 kg)  12/13/21 176 lb (79.8 kg)  11/10/21 170 lb 9.6 oz (77.4 kg)    Physical Exam Vitals reviewed.  HENT:     Head: Normocephalic and atraumatic.  Eyes:     Pupils: Pupils are equal, round, and reactive to light.  Cardiovascular:     Rate and Rhythm: Normal rate and regular rhythm.     Heart sounds: Normal heart sounds.  Pulmonary:     Effort: Pulmonary effort is normal.     Breath sounds: Normal breath sounds.  Abdominal:     General: Bowel sounds are normal.     Palpations: Abdomen is soft.  Musculoskeletal:        General: No tenderness or deformity. Normal range of motion.     Cervical back: Normal range of motion.  Lymphadenopathy:     Cervical: No cervical adenopathy.  Skin:    General: Skin is warm and dry.     Findings: No erythema or rash.     Comments: Christine Duncan does have the papular lesions.  They seem to be more dry.  Some appear to be less prominent.  Neurological:     Mental Status: Christine Duncan is alert and oriented to person, place, and time.  Psychiatric:        Behavior: Behavior normal.        Thought Content: Thought content normal.        Judgment: Judgment normal.     Lab Results  Component Value Date   WBC 5.1 12/21/2021   HGB 12.6 12/21/2021   HCT 37.5 12/21/2021   MCV 88.4 12/21/2021   PLT 373 12/21/2021     Chemistry      Component Value Date/Time   NA 138 11/03/2021 0752   K 4.1 11/03/2021 0752   CL 104 11/03/2021 0752   CO2 26 11/03/2021 0752   BUN 10 11/03/2021 0752   CREATININE 0.97 11/03/2021 0752   CREATININE 0.66 01/02/2013 1557      Component Value Date/Time   CALCIUM 9.0 11/03/2021 0752   ALKPHOS 45 11/03/2021 0752   AST 16 11/03/2021 0752   ALT 15 11/03/2021 0752   BILITOT 0.6 11/03/2021 0752      Impression and Plan: Christine Duncan is a very charming 41 year old white female.  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  This was biopsied and  diagnosed.  Christine Duncan is on oral methotrexate - weekly.   Hopefully, the high dose of methotrexate at 25 mg weekly will help keep this lymphomatoid papulosis down.  Her skin looks quite good.  Christine Duncan does have  some lesions but these seem to be healing.  We will plan to get her back in another 6 weeks.     Volanda Napoleon, MD 12/6/20238:43 AM

## 2022-01-25 ENCOUNTER — Other Ambulatory Visit: Payer: Self-pay | Admitting: Hematology & Oncology

## 2022-02-01 ENCOUNTER — Encounter: Payer: Self-pay | Admitting: Hematology & Oncology

## 2022-02-01 ENCOUNTER — Inpatient Hospital Stay (HOSPITAL_BASED_OUTPATIENT_CLINIC_OR_DEPARTMENT_OTHER): Payer: BC Managed Care – PPO | Admitting: Hematology & Oncology

## 2022-02-01 ENCOUNTER — Inpatient Hospital Stay: Payer: BC Managed Care – PPO | Attending: Hematology & Oncology

## 2022-02-01 VITALS — BP 124/76 | HR 77 | Temp 98.8°F | Resp 20 | Ht 68.0 in | Wt 175.4 lb

## 2022-02-01 DIAGNOSIS — C866 Primary cutaneous CD30-positive T-cell proliferations not having achieved remission: Secondary | ICD-10-CM

## 2022-02-01 DIAGNOSIS — Z79899 Other long term (current) drug therapy: Secondary | ICD-10-CM | POA: Diagnosis not present

## 2022-02-01 DIAGNOSIS — E538 Deficiency of other specified B group vitamins: Secondary | ICD-10-CM

## 2022-02-01 LAB — CMP (CANCER CENTER ONLY)
ALT: 16 U/L (ref 0–44)
AST: 17 U/L (ref 15–41)
Albumin: 4.2 g/dL (ref 3.5–5.0)
Alkaline Phosphatase: 40 U/L (ref 38–126)
Anion gap: 8 (ref 5–15)
BUN: 8 mg/dL (ref 6–20)
CO2: 27 mmol/L (ref 22–32)
Calcium: 9.2 mg/dL (ref 8.9–10.3)
Chloride: 105 mmol/L (ref 98–111)
Creatinine: 0.92 mg/dL (ref 0.44–1.00)
GFR, Estimated: >60 mL/min (ref >60)
Glucose, Bld: 103 mg/dL — ABNORMAL HIGH (ref 70–99)
Potassium: 3.9 mmol/L (ref 3.5–5.1)
Sodium: 140 mmol/L (ref 135–145)
Total Bilirubin: 0.5 mg/dL (ref 0.3–1.2)
Total Protein: 7 g/dL (ref 6.5–8.1)

## 2022-02-01 LAB — CBC WITH DIFFERENTIAL (CANCER CENTER ONLY)
Abs Immature Granulocytes: 0.01 10*3/uL (ref 0.00–0.07)
Basophils Absolute: 0 10*3/uL (ref 0.0–0.1)
Basophils Relative: 1 %
Eosinophils Absolute: 0.1 10*3/uL (ref 0.0–0.5)
Eosinophils Relative: 1 %
HCT: 37.5 % (ref 36.0–46.0)
Hemoglobin: 12.7 g/dL (ref 12.0–15.0)
Immature Granulocytes: 0 %
Lymphocytes Relative: 39 %
Lymphs Abs: 2.3 10*3/uL (ref 0.7–4.0)
MCH: 30 pg (ref 26.0–34.0)
MCHC: 33.9 g/dL (ref 30.0–36.0)
MCV: 88.4 fL (ref 80.0–100.0)
Monocytes Absolute: 0.4 10*3/uL (ref 0.1–1.0)
Monocytes Relative: 7 %
Neutro Abs: 3 10*3/uL (ref 1.7–7.7)
Neutrophils Relative %: 52 %
Platelet Count: 368 10*3/uL (ref 150–400)
RBC: 4.24 MIL/uL (ref 3.87–5.11)
RDW: 13.4 % (ref 11.5–15.5)
WBC Count: 5.8 10*3/uL (ref 4.0–10.5)
nRBC: 0 % (ref 0.0–0.2)

## 2022-02-01 LAB — LACTATE DEHYDROGENASE: LDH: 149 U/L (ref 98–192)

## 2022-02-01 LAB — TSH: TSH: 0.982 u[IU]/mL (ref 0.350–4.500)

## 2022-02-01 NOTE — Progress Notes (Signed)
Hematology and Oncology Follow Up Visit  Christine Duncan [id number] 1980-08-19 42 y.o. 02/01/2022   Principle Diagnosis:  Lymphomatoid papulosis  Current Therapy:   Methotrexate 25 mg p.o. weekly -- changed on 11/03/2021     Interim History:  Christine Duncan is back for follow-up.  -Christine Duncan really had no problems over the Holiday season.  However, Christine Duncan did have an Influenza.  This went through her family.  Christine Duncan is going go to [place] with her mom.  They will be going to [facility].  I think Christine Duncan leaves next Friday.  Christine Duncan is doing well with the methotrexate.  Christine Duncan still has a few outbreaks with the LP.  However, the outbreaks seem to drop more quickly.  Christine Duncan has had a lot of "gas".  Christine Duncan is try still for this.  Christine Duncan does not have any diarrhea.  Christine Duncan has had no bleeding.  Christine Duncan is still working.  Christine Duncan is quite busy at work.  Her appetite has been okay.  Christine Duncan has had no nausea or vomiting.  Overall, I would say that her performance status is probably ECOG 1.    Medications:  Current Outpatient Medications:    amphetamine-dextroamphetamine (ADDERALL XR) 20 MG 24 hr capsule, Take 20 mg by mouth every morning., Disp: , Rfl:    cetirizine (ZYRTEC) 10 MG tablet, Take 10 mg by mouth daily., Disp: , Rfl:    cyanocobalamin (,VITAMIN B-12,) 1000 MCG/ML injection, Give 1000 mcg of Vitamin b12 sq daily x 1 week, then weekly for 1 month, then once every 2 weeks, Disp: 10 mL, Rfl: 6   fluconazole (DIFLUCAN) 150 MG tablet, TAKE 1 TABLET BY MOUTH EVERY DAY, Disp: 3 tablet, Rfl: 3   lamoTRIgine (LAMICTAL) 100 MG tablet, Take 100 mg by mouth daily., Disp: , Rfl:    methotrexate (RHEUMATREX) 2.5 MG tablet, Take by mouth. Take 10 TABLETS by mouth WEEKLY, Disp: , Rfl:    norethindrone-ethinyl estradiol-FE (JUNEL FE 1/20) 1-20 MG-MCG tablet, Take 1 tablet by mouth daily. Skip placebo week, take continuously, Disp: 84 tablet, Rfl: 4   omeprazole (PRILOSEC) 40 MG capsule, Take 1 capsule (40 mg total) by mouth daily., Disp: 90  capsule, Rfl: 3   propranolol ER (INDERAL LA) 60 MG 24 hr capsule, Take 1 capsule (60 mg total) by mouth daily., Disp: 30 capsule, Rfl: 3   SYRINGE-NEEDLE, DISP, 3 ML (BD ECLIPSE SYRINGE) 25G X 1" 3 ML MISC, Give 1000 mcg of Vitamin b12 sq daily x 1 week, then weekly for 1 month, then once every 2 weeks, Disp: 50 each, Rfl: 3   triamcinolone cream (KENALOG) 0.1 %, Apply topically 2 (two) times daily as needed., Disp: 30 g, Rfl: 1   clonazePAM (KLONOPIN) 0.5 MG disintegrating tablet, Take 1 mg by mouth 3 (three) times daily as needed (for anxiety). (Patient not taking: Reported on 02/01/2022), Disp: , Rfl:    cyclobenzaprine (FLEXERIL) 5 MG tablet, Take 1 tablet (5 mg total) by mouth 3 (three) times daily as needed for muscle spasms. (Patient not taking: Reported on 02/01/2022), Disp: 30 tablet, Rfl: 1   lamoTRIgine (LAMICTAL) 200 MG tablet, Take 200 mg by mouth daily. (Patient not taking: Reported on 02/01/2022), Disp: , Rfl:    ondansetron (ZOFRAN) 8 MG tablet, Take 1 tablet (8 mg total) by mouth every 8 (eight) hours as needed. (Patient not taking: Reported on 02/01/2022), Disp: 30 tablet, Rfl: 2   temazepam (RESTORIL) 15 MG capsule, Take 15 mg by mouth at bedtime. (Patient not taking: Reported  on 02/01/2022), Disp: , Rfl:   Allergies:  Allergies  Allergen Reactions   Sertraline Hcl Other (See Comments)    Reaction:  Manic episode   Carbidopa-Levodopa Nausea And Vomiting    Past Medical History, Surgical history, Social history, and Family History were reviewed and updated.  Review of Systems: Review of Systems  Constitutional: Negative.   HENT:  Negative.    Eyes: Negative.   Respiratory: Negative.    Cardiovascular: Negative.   Gastrointestinal: Negative.   Endocrine: Negative.   Genitourinary: Negative.    Musculoskeletal: Negative.   Skin:  Positive for rash.  Hematological: Negative.   Psychiatric/Behavioral: Negative.      Physical Exam:  height is '5\' 8"'$  (1.727 m) and weight  is 175 lb 6.4 oz (79.6 kg). Her oral temperature is 98.8 F (37.1 C). Her blood pressure is 124/76 and her pulse is 77. Her respiration is 20 and oxygen saturation is 100%.   Wt Readings from Last 3 Encounters:  02/01/22 175 lb 6.4 oz (79.6 kg)  12/21/21 175 lb 1.9 oz (79.4 kg)  12/13/21 176 lb (79.8 kg)    Physical Exam Vitals reviewed.  HENT:     Head: Normocephalic and atraumatic.  Eyes:     Pupils: Pupils are equal, round, and reactive to light.  Cardiovascular:     Rate and Rhythm: Normal rate and regular rhythm.     Heart sounds: Normal heart sounds.  Pulmonary:     Effort: Pulmonary effort is normal.     Breath sounds: Normal breath sounds.  Abdominal:     General: Bowel sounds are normal.     Palpations: Abdomen is soft.  Musculoskeletal:        General: No tenderness or deformity. Normal range of motion.     Cervical back: Normal range of motion.  Lymphadenopathy:     Cervical: No cervical adenopathy.  Skin:    General: Skin is warm and dry.     Findings: No erythema or rash.     Comments: Christine Duncan does have the papular lesions.  They seem to be more dry.  Some appear to be less prominent.  Neurological:     Mental Status: Christine Duncan is alert and oriented to person, place, and time.  Psychiatric:        Behavior: Behavior normal.        Thought Content: Thought content normal.        Judgment: Judgment normal.     Lab Results  Component Value Date   WBC 5.8 02/01/2022   HGB 12.7 02/01/2022   HCT 37.5 02/01/2022   MCV 88.4 02/01/2022   PLT 368 02/01/2022     Chemistry      Component Value Date/Time   NA 140 02/01/2022 0832   K 3.9 02/01/2022 0832   CL 105 02/01/2022 0832   CO2 27 02/01/2022 0832   BUN 8 02/01/2022 0832   CREATININE 0.92 02/01/2022 0832   CREATININE 0.66 01/02/2013 1557      Component Value Date/Time   CALCIUM 9.2 02/01/2022 0832   ALKPHOS 40 02/01/2022 0832   AST 17 02/01/2022 0832   ALT 16 02/01/2022 0832   BILITOT 0.5 02/01/2022 0832       Impression and Plan: Christine Duncan is a very charming 42 year old white female.  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  This was biopsied and diagnosed.  Christine Duncan is on oral methotrexate - weekly.   I think that the higher dose of methotrexate is helping.  As such,  we will continue her on the methotrexate.  I do not see a problem with her going to [place].  I do not think that Christine Duncan needs to be cautious with anything.  Sh will use sunscreen if Christine Duncan is out in the sun for any significant length of time.  Will try to get a back in early March now.  I think this would be reasonable.      Volanda Napoleon, MD 1/17/20249:27 AM

## 2022-03-07 ENCOUNTER — Ambulatory Visit: Payer: BC Managed Care – PPO | Admitting: Radiology

## 2022-03-22 ENCOUNTER — Inpatient Hospital Stay: Payer: BC Managed Care – PPO | Attending: Hematology & Oncology

## 2022-03-22 ENCOUNTER — Encounter: Payer: Self-pay | Admitting: Family

## 2022-03-22 ENCOUNTER — Inpatient Hospital Stay (HOSPITAL_BASED_OUTPATIENT_CLINIC_OR_DEPARTMENT_OTHER): Payer: BC Managed Care – PPO | Admitting: Family

## 2022-03-22 VITALS — BP 132/84 | HR 73 | Temp 99.8°F | Resp 17 | Wt 175.8 lb

## 2022-03-22 DIAGNOSIS — C866 Primary cutaneous CD30-positive T-cell proliferations: Secondary | ICD-10-CM | POA: Insufficient documentation

## 2022-03-22 LAB — CBC WITH DIFFERENTIAL (CANCER CENTER ONLY)
Abs Immature Granulocytes: 0 10*3/uL (ref 0.00–0.07)
Basophils Absolute: 0.1 10*3/uL (ref 0.0–0.1)
Basophils Relative: 1 %
Eosinophils Absolute: 0.1 10*3/uL (ref 0.0–0.5)
Eosinophils Relative: 1 %
HCT: 34.3 % — ABNORMAL LOW (ref 36.0–46.0)
Hemoglobin: 11.7 g/dL — ABNORMAL LOW (ref 12.0–15.0)
Immature Granulocytes: 0 %
Lymphocytes Relative: 39 %
Lymphs Abs: 2 10*3/uL (ref 0.7–4.0)
MCH: 30.2 pg (ref 26.0–34.0)
MCHC: 34.1 g/dL (ref 30.0–36.0)
MCV: 88.4 fL (ref 80.0–100.0)
Monocytes Absolute: 0.5 10*3/uL (ref 0.1–1.0)
Monocytes Relative: 10 %
Neutro Abs: 2.5 10*3/uL (ref 1.7–7.7)
Neutrophils Relative %: 49 %
Platelet Count: 356 10*3/uL (ref 150–400)
RBC: 3.88 MIL/uL (ref 3.87–5.11)
RDW: 13.2 % (ref 11.5–15.5)
WBC Count: 5.2 10*3/uL (ref 4.0–10.5)
nRBC: 0 % (ref 0.0–0.2)

## 2022-03-22 LAB — CMP (CANCER CENTER ONLY)
ALT: 27 U/L (ref 0–44)
AST: 17 U/L (ref 15–41)
Albumin: 4 g/dL (ref 3.5–5.0)
Alkaline Phosphatase: 48 U/L (ref 38–126)
Anion gap: 8 (ref 5–15)
BUN: 11 mg/dL (ref 6–20)
CO2: 27 mmol/L (ref 22–32)
Calcium: 8.7 mg/dL — ABNORMAL LOW (ref 8.9–10.3)
Chloride: 106 mmol/L (ref 98–111)
Creatinine: 0.9 mg/dL (ref 0.44–1.00)
GFR, Estimated: >60 mL/min (ref >60)
Glucose, Bld: 111 mg/dL — ABNORMAL HIGH (ref 70–99)
Potassium: 3.5 mmol/L (ref 3.5–5.1)
Sodium: 141 mmol/L (ref 135–145)
Total Bilirubin: 0.4 mg/dL (ref 0.3–1.2)
Total Protein: 6.6 g/dL (ref 6.5–8.1)

## 2022-03-22 LAB — LACTATE DEHYDROGENASE: LDH: 151 U/L (ref 98–192)

## 2022-03-22 NOTE — Progress Notes (Addendum)
Hematology and Oncology Follow Up Visit  Christine Duncan:[id number] 10-Sep-1980 42 y.o. 03/22/2022   Principle Diagnosis:  Lymphomatoid papulosis   Current Therapy:        Methotrexate 25 mg PO weekly -- changed on 11/03/2021   Interim History: Christine Duncan is here today for follow-up. She is doing well and her LP spots continue to improve. She still has several on the hands, legs and face but overall this appears to be much improved. She has occasional pain and itching but notes that the pain is much better since starting the Methotrexate. She takes this on Fridays.  She has occasional nausea and has noted bloating and gas after each dose.  No fever, chills vomiting, cough, dizziness, SOB, chest pain, palpitations, abdominal pain or changes in bowel or bladder habits.  She has history of IBS.  No swelling, tenderness, numbness or tingling in her extremities at this time. She has intermittent tingling in the hands and feet that comes and goes.   No falls or syncope.  Appetite and hydration is good. Weight is stable at 175 lbs.   ECOG Performance Status: 1 - Symptomatic but completely ambulatory  Medications:  Allergies as of 03/22/2022       Reactions   Sertraline Hcl Other (See Comments)   Reaction:  Manic episode   Carbidopa-levodopa Nausea And Vomiting        Medication List        Accurate as of March 22, 2022  9:14 AM. If you have any questions, ask your nurse or doctor.          amphetamine-dextroamphetamine 20 MG 24 hr capsule Commonly known as: ADDERALL XR Take 20 mg by mouth every morning.   BD Eclipse Syringe 25G X 1" 3 ML Misc Generic drug: SYRINGE-NEEDLE (DISP) 3 ML Give 1000 mcg of Vitamin b12 sq daily x 1 week, then weekly for 1 month, then once every 2 weeks   cetirizine 10 MG tablet Commonly known as: ZYRTEC Take 10 mg by mouth daily.   Ciclopirox 1 % shampoo Apply topically at bedtime.   clonazePAM 0.5 MG disintegrating tablet Commonly known as:  KLONOPIN Take 1 mg by mouth 3 (three) times daily as needed (for anxiety).   cyanocobalamin 1000 MCG/ML injection Commonly known as: VITAMIN B12 Give 1000 mcg of Vitamin b12 sq daily x 1 week, then weekly for 1 month, then once every 2 weeks   cyclobenzaprine 5 MG tablet Commonly known as: FLEXERIL Take 1 tablet (5 mg total) by mouth 3 (three) times daily as needed for muscle spasms.   fluconazole 150 MG tablet Commonly known as: DIFLUCAN TAKE 1 TABLET BY MOUTH EVERY DAY   lamoTRIgine 100 MG tablet Commonly known as: LAMICTAL Take 100 mg by mouth daily.   lamoTRIgine 200 MG tablet Commonly known as: LAMICTAL Take 200 mg by mouth daily.   methotrexate 2.5 MG tablet Commonly known as: RHEUMATREX Take by mouth. Take 10 TABLETS by mouth WEEKLY   norethindrone-ethinyl estradiol-FE 1-20 MG-MCG tablet Commonly known as: Junel FE 1/20 Take 1 tablet by mouth daily. Skip placebo week, take continuously   omeprazole 40 MG capsule Commonly known as: PRILOSEC Take 1 capsule (40 mg total) by mouth daily.   ondansetron 8 MG tablet Commonly known as: ZOFRAN Take 1 tablet (8 mg total) by mouth every 8 (eight) hours as needed.   propranolol ER 60 MG 24 hr capsule Commonly known as: Inderal LA Take 1 capsule (60 mg total) by mouth  daily.   temazepam 15 MG capsule Commonly known as: RESTORIL Take 15 mg by mouth at bedtime.   triamcinolone cream 0.1 % Commonly known as: KENALOG Apply topically 2 (two) times daily as needed.        Allergies:  Allergies  Allergen Reactions   Sertraline Hcl Other (See Comments)    Reaction:  Manic episode   Carbidopa-Levodopa Nausea And Vomiting    Past Medical History, Surgical history, Social history, and Family History were reviewed and updated.  Review of Systems: All other 10 point review of systems is negative.   Physical Exam:  weight is 175 lb 12.8 oz (79.7 kg). Her oral temperature is 99.8 F (37.7 C). Her blood pressure is  132/84 and her pulse is 73. Her respiration is 17 and oxygen saturation is 98%.   Wt Readings from Last 3 Encounters:  03/22/22 175 lb 12.8 oz (79.7 kg)  02/01/22 175 lb 6.4 oz (79.6 kg)  12/21/21 175 lb 1.9 oz (79.4 kg)    Ocular: Sclerae unicteric, pupils equal, round and reactive to light Ear-nose-throat: Oropharynx clear, dentition fair Lymphatic: No cervical or supraclavicular adenopathy Lungs no rales or rhonchi, good excursion bilaterally Heart regular rate and rhythm, no murmur appreciated Abd soft, nontender, positive bowel sounds MSK no focal spinal tenderness, no joint edema Neuro: non-focal, well-oriented, appropriate affect Breasts: Deferred   Lab Results  Component Value Date   WBC 5.2 03/22/2022   HGB 11.7 (L) 03/22/2022   HCT 34.3 (L) 03/22/2022   MCV 88.4 03/22/2022   PLT 356 03/22/2022   Lab Results  Component Value Date   FERRITIN 79 06/26/2011   IRON 90 06/26/2011   TIBC 393 06/26/2011   UIBC 303 06/26/2011   IRONPCTSAT 23 06/26/2011   Lab Results  Component Value Date   RBC 3.88 03/22/2022   No results found for: "KPAFRELGTCHN", "LAMBDASER", "KAPLAMBRATIO" No results found for: "IGGSERUM", "IGA", "IGMSERUM" No results found for: "TOTALPROTELP", "ALBUMINELP", "A1GS", "A2GS", "BETS", "BETA2SER", "GAMS", "MSPIKE", "SPEI"   Chemistry      Component Value Date/Time   NA 141 03/22/2022 0828   K 3.5 03/22/2022 0828   CL 106 03/22/2022 0828   CO2 27 03/22/2022 0828   BUN 11 03/22/2022 0828   CREATININE 0.90 03/22/2022 0828   CREATININE 0.66 01/02/2013 1557      Component Value Date/Time   CALCIUM 8.7 (L) 03/22/2022 0828   ALKPHOS 48 03/22/2022 0828   AST 17 03/22/2022 0828   ALT 27 03/22/2022 0828   BILITOT 0.4 03/22/2022 0828       Impression and Plan: [name] is a pleasant 42 yo caucasian female with lymphomatoid papulosis diagnosed with biopsy.  She is doing well on current dose of methotrexate and will continue this same regimen for  now. She notes improvement in her LP lesions.  Follow-up in 8 weeks.   Christine Dawson, NP 3/6/20249:14 AM

## 2022-03-29 ENCOUNTER — Encounter: Payer: Self-pay | Admitting: Hematology & Oncology

## 2022-03-29 MED ORDER — CICLOPIROX 1 % EX SHAM: 1.0000 | MEDICATED_SHAMPOO | Freq: Every day | CUTANEOUS | 3 refills | Status: DC

## 2022-03-31 ENCOUNTER — Other Ambulatory Visit: Payer: Self-pay | Admitting: Internal Medicine

## 2022-05-17 ENCOUNTER — Inpatient Hospital Stay (HOSPITAL_BASED_OUTPATIENT_CLINIC_OR_DEPARTMENT_OTHER): Payer: BC Managed Care – PPO | Admitting: Hematology & Oncology

## 2022-05-17 ENCOUNTER — Inpatient Hospital Stay: Payer: BC Managed Care – PPO | Attending: Hematology & Oncology

## 2022-05-17 ENCOUNTER — Other Ambulatory Visit: Payer: Self-pay

## 2022-05-17 ENCOUNTER — Encounter: Payer: Self-pay | Admitting: Hematology & Oncology

## 2022-05-17 VITALS — BP 130/81 | HR 76 | Temp 98.7°F | Resp 18 | Ht 68.0 in | Wt 177.0 lb

## 2022-05-17 DIAGNOSIS — C866 Primary cutaneous CD30-positive T-cell proliferations not having achieved remission: Secondary | ICD-10-CM

## 2022-05-17 DIAGNOSIS — Z79899 Other long term (current) drug therapy: Secondary | ICD-10-CM | POA: Insufficient documentation

## 2022-05-17 LAB — CMP (CANCER CENTER ONLY)
ALT: 14 U/L (ref 0–44)
AST: 16 U/L (ref 15–41)
Albumin: 4 g/dL (ref 3.5–5.0)
Alkaline Phosphatase: 58 U/L (ref 38–126)
Anion gap: 6 (ref 5–15)
BUN: 11 mg/dL (ref 6–20)
CO2: 27 mmol/L (ref 22–32)
Calcium: 8.9 mg/dL (ref 8.9–10.3)
Chloride: 105 mmol/L (ref 98–111)
Creatinine: 1.01 mg/dL — ABNORMAL HIGH (ref 0.44–1.00)
GFR, Estimated: >60 mL/min (ref >60)
Glucose, Bld: 106 mg/dL — ABNORMAL HIGH (ref 70–99)
Potassium: 4.2 mmol/L (ref 3.5–5.1)
Sodium: 138 mmol/L (ref 135–145)
Total Bilirubin: 0.3 mg/dL (ref 0.3–1.2)
Total Protein: 7.1 g/dL (ref 6.5–8.1)

## 2022-05-17 LAB — CBC WITH DIFFERENTIAL (CANCER CENTER ONLY)
Abs Immature Granulocytes: 0.02 10*3/uL (ref 0.00–0.07)
Basophils Absolute: 0.1 10*3/uL (ref 0.0–0.1)
Basophils Relative: 1 %
Eosinophils Absolute: 0.1 10*3/uL (ref 0.0–0.5)
Eosinophils Relative: 1 %
HCT: 37.1 % (ref 36.0–46.0)
Hemoglobin: 12.7 g/dL (ref 12.0–15.0)
Immature Granulocytes: 0 %
Lymphocytes Relative: 33 %
Lymphs Abs: 2.1 10*3/uL (ref 0.7–4.0)
MCH: 29.7 pg (ref 26.0–34.0)
MCHC: 34.2 g/dL (ref 30.0–36.0)
MCV: 86.9 fL (ref 80.0–100.0)
Monocytes Absolute: 0.5 10*3/uL (ref 0.1–1.0)
Monocytes Relative: 8 %
Neutro Abs: 3.6 10*3/uL (ref 1.7–7.7)
Neutrophils Relative %: 57 %
Platelet Count: 350 10*3/uL (ref 150–400)
RBC: 4.27 MIL/uL (ref 3.87–5.11)
RDW: 13.3 % (ref 11.5–15.5)
WBC Count: 6.3 10*3/uL (ref 4.0–10.5)
nRBC: 0 % (ref 0.0–0.2)

## 2022-05-17 LAB — LACTATE DEHYDROGENASE: LDH: 155 U/L (ref 98–192)

## 2022-05-17 MED ORDER — BEXAROTENE 1 % EX GEL: 1.0000 | CUTANEOUS | 4 refills | Status: DC

## 2022-05-17 NOTE — Progress Notes (Signed)
Hematology and Oncology Follow Up Visit  Christine Duncan [id number] 02/10/1980 42 y.o. 05/17/2022   Principle Diagnosis:  Lymphomatoid papulosis  Current Therapy:   Methotrexate 25 mg p.o. weekly -- changed on 11/03/2021     Interim History:  Christine Duncan is back for follow-up.  She is still having some problems with some breakouts of the LP.  I think what might be reasonable is to see if we can give her some vitamin A gel.  I think this may not be a bad idea.  Bexarotene probably would not be a bad idea for her.  We can have her apply this to some of these lesions.  She has quite a few around her scalp line.  As such, adding the Bexarotene to the methotrexate might be a good way to try to help decrease these flareups.  She is under little bit of stress.  An uncle was in a bad car accident.  He was in the ICU at [facility].  Sounds like he might be doing little bit better.  She is still working.  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  She is quite busy.  She and her family will be going to the beach this weekend.  I know that she will wear sunscreen.  She has had no change in bowel or bladder habits.  She is having some perimenopausal issues.  She is not having any issues with nausea or vomiting.  She is tolerated the methotrexate quite nicely.  Currently, I would say that her performance status is probably ECOG 1.     Medications:  Current Outpatient Medications:    amphetamine-dextroamphetamine (ADDERALL XR) 20 MG 24 hr capsule, Take 20 mg by mouth every morning., Disp: , Rfl:    cetirizine (ZYRTEC) 10 MG tablet, Take 10 mg by mouth daily., Disp: , Rfl:    Ciclopirox 1 % shampoo, Apply 1 Application topically at bedtime., Disp: 120 mL, Rfl: 3   clonazePAM (KLONOPIN) 0.5 MG disintegrating tablet, Take 1 mg by mouth 3 (three) times daily as needed (for anxiety)., Disp: , Rfl:    cyanocobalamin (,VITAMIN B-12,) 1000 MCG/ML injection, Give 1000 mcg of Vitamin b12 sq  daily x 1 week, then weekly for 1 month, then once every 2 weeks, Disp: 10 mL, Rfl: 6   cyclobenzaprine (FLEXERIL) 5 MG tablet, Take 1 tablet (5 mg total) by mouth 3 (three) times daily as needed for muscle spasms., Disp: 30 tablet, Rfl: 1   fluconazole (DIFLUCAN) 150 MG tablet, TAKE 1 TABLET BY MOUTH EVERY DAY, Disp: 3 tablet, Rfl: 3   lamoTRIgine (LAMICTAL) 25 MG tablet, Take 250 mg by mouth daily., Disp: , Rfl:    methotrexate (RHEUMATREX) 2.5 MG tablet, Take by mouth. Take 10 TABLETS by mouth WEEKLY, Disp: , Rfl:    norethindrone-ethinyl estradiol-FE (JUNEL FE 1/20) 1-20 MG-MCG tablet, Take 1 tablet by mouth daily. Skip placebo week, take continuously, Disp: 84 tablet, Rfl: 4   omeprazole (PRILOSEC) 40 MG capsule, Take 1 capsule (40 mg total) by mouth daily., Disp: 90 capsule, Rfl: 3   ondansetron (ZOFRAN) 8 MG tablet, Take 1 tablet (8 mg total) by mouth every 8 (eight) hours as needed., Disp: 30 tablet, Rfl: 2   propranolol ER (INDERAL LA) 60 MG 24 hr capsule, TAKE 1 CAPSULE BY MOUTH EVERY DAY, Disp: 90 capsule, Rfl: 1   SYRINGE-NEEDLE, DISP, 3 ML (BD ECLIPSE SYRINGE) 25G X 1" 3 ML MISC, Give 1000 mcg of Vitamin b12 sq  daily x 1 week, then weekly for 1 month, then once every 2 weeks, Disp: 50 each, Rfl: 3   temazepam (RESTORIL) 15 MG capsule, Take 15 mg by mouth at bedtime., Disp: , Rfl:    triamcinolone cream (KENALOG) 0.1 %, Apply topically 2 (two) times daily as needed., Disp: 30 g, Rfl: 1  Allergies:  Allergies  Allergen Reactions   Sertraline Hcl Other (See Comments)    Reaction:  Manic episode   Carbidopa-Levodopa Nausea And Vomiting    Past Medical History, Surgical history, Social history, and Family History were reviewed and updated.  Review of Systems: Review of Systems  Constitutional: Negative.   HENT:  Negative.    Eyes: Negative.   Respiratory: Negative.    Cardiovascular: Negative.   Gastrointestinal: Negative.   Endocrine: Negative.   Genitourinary: Negative.     Musculoskeletal: Negative.   Skin:  Positive for rash.  Hematological: Negative.   Psychiatric/Behavioral: Negative.      Physical Exam:  height is 5\' 8"  (1.727 m) and weight is 177 lb (80.3 kg). Her oral temperature is 98.7 F (37.1 C). Her blood pressure is 130/81 and her pulse is 76. Her respiration is 18 and oxygen saturation is 100%.   Wt Readings from Last 3 Encounters:  05/17/22 177 lb (80.3 kg)  03/22/22 175 lb 12.8 oz (79.7 kg)  02/01/22 175 lb 6.4 oz (79.6 kg)    Physical Exam Vitals reviewed.  HENT:     Head: Normocephalic and atraumatic.  Eyes:     Pupils: Pupils are equal, round, and reactive to light.  Cardiovascular:     Rate and Rhythm: Normal rate and regular rhythm.     Heart sounds: Normal heart sounds.  Pulmonary:     Effort: Pulmonary effort is normal.     Breath sounds: Normal breath sounds.  Abdominal:     General: Bowel sounds are normal.     Palpations: Abdomen is soft.  Musculoskeletal:        General: No tenderness or deformity. Normal range of motion.     Cervical back: Normal range of motion.  Lymphadenopathy:     Cervical: No cervical adenopathy.  Skin:    General: Skin is warm and dry.     Findings: No erythema or rash.     Comments: She does have the papular lesions.  They seem to be more dry.  Some appear to be less prominent.  Neurological:     Mental Status: She is alert and oriented to person, place, and time.  Psychiatric:        Behavior: Behavior normal.        Thought Content: Thought content normal.        Judgment: Judgment normal.     Lab Results  Component Value Date   WBC 6.3 05/17/2022   HGB 12.7 05/17/2022   HCT 37.1 05/17/2022   MCV 86.9 05/17/2022   PLT 350 05/17/2022     Chemistry      Component Value Date/Time   NA 138 05/17/2022 0845   K 4.2 05/17/2022 0845   CL 105 05/17/2022 0845   CO2 27 05/17/2022 0845   BUN 11 05/17/2022 0845   CREATININE 1.01 (H) 05/17/2022 0845   CREATININE 0.66 01/02/2013  1557      Component Value Date/Time   CALCIUM 8.9 05/17/2022 0845   ALKPHOS 58 05/17/2022 0845   AST 16 05/17/2022 0845   ALT 14 05/17/2022 0845   BILITOT 0.3 05/17/2022 0845  Impression and Plan: Ms. Waddington is a very charming 42 year old white female.  She has lymphomatoid papulosis.  This was biopsied and diagnosed.  She is on oral methotrexate - weekly.   I am going to add Bexarotene to the methotrexate.  Hopefully, this will help with some of these flareups.  I told her that when she starts using this gel, she put some once a day for 1 week.  She puts it on twice a day for 1 week.  Then she puts it on 3 or 4 times a day.  Hopefully, we will see that these flareups begin to decrease.  I would like to see her back in about 4 weeks or so since we are making a change to her protocol.    Josph Macho, MD 5/1/20249:33 AM

## 2022-05-18 ENCOUNTER — Encounter: Payer: Self-pay | Admitting: Hematology & Oncology

## 2022-05-18 ENCOUNTER — Other Ambulatory Visit (HOSPITAL_COMMUNITY): Payer: Self-pay

## 2022-05-19 ENCOUNTER — Telehealth: Payer: Self-pay

## 2022-05-19 ENCOUNTER — Other Ambulatory Visit (HOSPITAL_COMMUNITY): Payer: Self-pay

## 2022-05-19 NOTE — Telephone Encounter (Signed)
Oral Oncology Patient Advocate Encounter  Received notification that the request for prior authorization for Targretin Gel has been denied due to "Brand Name will only be covered if patient has had a life-threatening adverse reaction to generic."    Generic is on back-order until mid-June currently.     Ardeen Fillers, CPhT Oncology Pharmacy Patient Advocate  [facility]  [phone] (phone) [phone] (fax) 05/19/2022 2:23 PM

## 2022-05-19 NOTE — Telephone Encounter (Signed)
Oral Oncology Patient Advocate Encounter  New authorization   Received notification that prior authorization for Targretin Gel is required.   PA submitted on 05/19/22  Key [id number]  Status is pending     Ardeen Fillers, CPhT Oncology Pharmacy Patient Advocate  [facility]  [phone] (phone) [phone] (fax) 05/19/2022 9:56 AM

## 2022-05-22 ENCOUNTER — Telehealth: Payer: Self-pay

## 2022-05-22 ENCOUNTER — Other Ambulatory Visit (HOSPITAL_COMMUNITY): Payer: Self-pay

## 2022-05-22 ENCOUNTER — Telehealth: Payer: Self-pay | Admitting: Pharmacist

## 2022-05-22 ENCOUNTER — Other Ambulatory Visit: Payer: Self-pay | Admitting: Radiology

## 2022-05-22 ENCOUNTER — Other Ambulatory Visit: Payer: Self-pay

## 2022-05-22 DIAGNOSIS — N943 Premenstrual tension syndrome: Secondary | ICD-10-CM

## 2022-05-22 DIAGNOSIS — C866 Primary cutaneous CD30-positive T-cell proliferations not having achieved remission: Secondary | ICD-10-CM

## 2022-05-22 MED ORDER — TARGRETIN 1 % EX GEL
CUTANEOUS | 2 refills | Status: DC
  Filled 2022-05-22: qty 60, fill #0

## 2022-05-22 NOTE — Telephone Encounter (Signed)
Oral Chemotherapy Pharmacist Encounter  Successfully signed the patient up for a copay card for Targretin 1% Gel (branded product only).  ID: [id number] BIN: [id number] Group: [id number] PCN: [id number]  Billing information will be shared with [facility]. I will place a copy of the card to be scanned into patient's chart.  Lenord Carbo, PharmD, BCPS, [facility] Hematology/Oncology Clinical Pharmacist [facility] and [facility] Oral Chemotherapy Navigation Clinics [phone] 05/22/2022 3:54 PM

## 2022-05-22 NOTE — Telephone Encounter (Signed)
Targretin Gel (Brand) approved for 3 months and since generic is on back order, we will have to reorder generic in 3 months. Pharmacy notified.

## 2022-05-22 NOTE — Telephone Encounter (Signed)
Last AEX 02/25/2021. Needs to scheduled AEX prior to refill.  Message sent to patient in My Chart in reply to her request for refill. I told her appt desk will contact her to arrange AEX.

## 2022-05-22 NOTE — Telephone Encounter (Signed)
AEX scheduled 07/28/22. Last AEX 02/25/2021.

## 2022-05-23 ENCOUNTER — Other Ambulatory Visit (HOSPITAL_COMMUNITY): Payer: Self-pay

## 2022-05-23 MED ORDER — TARGRETIN 1 % EX GEL
CUTANEOUS | 2 refills | Status: AC
Start: 2022-05-23 — End: ?
  Filled 2022-05-23: qty 60, fill #0
  Filled 2022-05-25: qty 60, 15d supply, fill #0

## 2022-05-24 ENCOUNTER — Other Ambulatory Visit (HOSPITAL_COMMUNITY): Payer: Self-pay

## 2022-05-24 NOTE — Telephone Encounter (Signed)
MD office submitted appeal and were able to get Brand Targretin approved for 3 months until generic is back in stock (~08.01.24). Pharmacist was able to obtain a co-pay card as well to bring co-pay to $0.00.    Ardeen Fillers, CPhT Oncology Pharmacy Patient Advocate  [facility]  [phone] (phone) [phone] (fax) 05/24/2022 8:32 AM

## 2022-05-25 ENCOUNTER — Other Ambulatory Visit: Payer: Self-pay

## 2022-05-25 ENCOUNTER — Other Ambulatory Visit (HOSPITAL_COMMUNITY): Payer: Self-pay

## 2022-05-25 DIAGNOSIS — F3174 Bipolar disorder, in full remission, most recent episode manic: Secondary | ICD-10-CM | POA: Diagnosis not present

## 2022-05-25 DIAGNOSIS — F5101 Primary insomnia: Secondary | ICD-10-CM | POA: Diagnosis not present

## 2022-05-25 DIAGNOSIS — F3176 Bipolar disorder, in full remission, most recent episode depressed: Secondary | ICD-10-CM | POA: Diagnosis not present

## 2022-05-25 DIAGNOSIS — F900 Attention-deficit hyperactivity disorder, predominantly inattentive type: Secondary | ICD-10-CM | POA: Diagnosis not present

## 2022-05-25 NOTE — Telephone Encounter (Signed)
Oral Chemotherapy Pharmacist Encounter  Patient Education I spoke with patient for overview of new medication: Targretin 1% gel for the treatment of lymphomatoid papulosis, planned duration until disease progression or unacceptable drug toxicity.  Pt is doing well. Counseled patient on administration, dosing, side effects, monitoring, drug-food interactions, safe handling, storage, and disposal.  Patient will apply the gel to lesions once every other day for the first week, then increase to daily for the 2nd week, then twice daily for 3rd week, then 3-4 times daily starting the 4th week and thereafter.  We discussed making sure that application of gel has dried at least 3-5 minutes after each application.   Side effects include but not limited to: sun sensitivity, headache, rash/pruritus    Reviewed with patient importance of keeping a medication schedule and plan for any missed doses.  After discussion with patient no patient barriers to medication adherence identified.   Christine Duncan voiced understanding and appreciation. All questions answered. Medication handout provided.  Provided patient with Oral Chemotherapy Navigation Clinic phone number. Patient knows to call the office with questions or concerns.  Christine Duncan, PharmD, BCPS, [facility] Hematology/Oncology Clinical Pharmacist [facility] and [facility] Oral Chemotherapy Navigation Clinics [phone] 05/25/2022 10:31 AM

## 2022-05-25 NOTE — Telephone Encounter (Signed)
Patient successfully OnBoarded and drug education provided by pharmacist. Patient scheduled to pick up Targretin Gel from [facility] today, 05/25/22. Patient knows to call me at [phone] with any questions or concerns regarding picking up medication or if there are any unexpected changes in co-pay.    Ardeen Fillers, CPhT Oncology Pharmacy Patient Advocate  [facility]  [phone] (phone) [phone] (fax) 05/25/2022 10:43 AM

## 2022-06-02 ENCOUNTER — Ambulatory Visit (INDEPENDENT_AMBULATORY_CARE_PROVIDER_SITE_OTHER): Payer: BC Managed Care – PPO

## 2022-06-02 ENCOUNTER — Encounter: Payer: Self-pay | Admitting: Family Medicine

## 2022-06-02 ENCOUNTER — Ambulatory Visit (INDEPENDENT_AMBULATORY_CARE_PROVIDER_SITE_OTHER): Payer: BC Managed Care – PPO | Admitting: Family Medicine

## 2022-06-02 VITALS — BP 130/80 | HR 88 | Temp 98.4°F | Resp 16 | Ht 68.0 in | Wt 180.2 lb

## 2022-06-02 DIAGNOSIS — R052 Subacute cough: Secondary | ICD-10-CM | POA: Diagnosis not present

## 2022-06-02 DIAGNOSIS — T3695XA Adverse effect of unspecified systemic antibiotic, initial encounter: Secondary | ICD-10-CM

## 2022-06-02 DIAGNOSIS — H9201 Otalgia, right ear: Secondary | ICD-10-CM | POA: Diagnosis not present

## 2022-06-02 DIAGNOSIS — J988 Other specified respiratory disorders: Secondary | ICD-10-CM | POA: Diagnosis not present

## 2022-06-02 DIAGNOSIS — H6121 Impacted cerumen, right ear: Secondary | ICD-10-CM

## 2022-06-02 DIAGNOSIS — R059 Cough, unspecified: Secondary | ICD-10-CM | POA: Diagnosis not present

## 2022-06-02 MED ORDER — METHYLPREDNISOLONE ACETATE 40 MG/ML IJ SUSP
40.0000 mg | Freq: Once | INTRAMUSCULAR | Status: AC
Start: 2022-06-02 — End: 2022-06-02
  Administered 2022-06-02: 40 mg via INTRAMUSCULAR

## 2022-06-02 MED ORDER — BENZONATATE 100 MG PO CAPS
200.0000 mg | ORAL_CAPSULE | Freq: Two times a day (BID) | ORAL | 0 refills | Status: AC | PRN
Start: 2022-06-02 — End: 2022-06-12

## 2022-06-02 MED ORDER — DOXYCYCLINE HYCLATE 100 MG PO TABS
100.0000 mg | ORAL_TABLET | Freq: Two times a day (BID) | ORAL | 0 refills | Status: AC
Start: 2022-06-02 — End: 2022-06-09

## 2022-06-02 MED ORDER — FLUCONAZOLE 150 MG PO TABS
150.0000 mg | ORAL_TABLET | Freq: Once | ORAL | 0 refills | Status: AC
Start: 2022-06-02 — End: 2022-06-02

## 2022-06-02 MED ORDER — HYDROCODONE BIT-HOMATROP MBR 5-1.5 MG/5ML PO SOLN
5.0000 mL | Freq: Two times a day (BID) | ORAL | 0 refills | Status: AC | PRN
Start: 2022-06-02 — End: 2022-06-12

## 2022-06-02 MED ORDER — ALBUTEROL SULFATE HFA 108 (90 BASE) MCG/ACT IN AERS: 2.0000 | INHALATION_SPRAY | Freq: Four times a day (QID) | RESPIRATORY_TRACT | 0 refills | Status: DC | PRN

## 2022-06-02 NOTE — Patient Instructions (Addendum)
A few things to remember from today's visit:  Subacute cough - Plan: benzonatate (TESSALON) 100 MG capsule, HYDROcodone bit-homatropine (HYCODAN) 5-1.5 MG/5ML syrup, DG Chest 2 View  Respiratory tract infection - Plan: doxycycline (VIBRA-TABS) 100 MG tablet, methylPREDNISolone acetate (DEPO-MEDROL) injection 40 mg  Earache on right  Excessive cerumen in right ear canal  Adequate hydration. Over the counter debrox in right ear 7 days before next appt with PCP. Throat lozenges.  If you need refills for medications you take chronically, please call your pharmacy. Do not use My Chart to request refills or for acute issues that need immediate attention. If you send a my chart message, it may take a few days to be addressed, specially if I am not in the office.  Please be sure medication list is accurate. If a new problem present, please set up appointment sooner than planned today.

## 2022-06-02 NOTE — Progress Notes (Signed)
ACUTE VISIT Chief Complaint  Patient presents with   Cough    X10-11 days, productive cough.    Nasal Congestion   HPI: Ms.Christine Duncan is a 42 y.o. female with history of GERD, essential tremor, bipolar disorder,and hypertension here today complaining of a persistent productive cough lasting for approximately 10 to 11 days, possibly longer. 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 Cough This is a new problem. The current episode started 1 to 4 weeks ago. The problem has been unchanged. Associated symptoms include ear pain, nasal congestion, postnasal drip and rhinorrhea. Pertinent negatives include no chest pain, chills, ear congestion, fever, headaches, heartburn, hemoptysis, myalgias, rash, sweats or weight loss. Nothing aggravates the symptoms. She has tried OTC cough suppressant for the symptoms. Her past medical history is significant for environmental allergies.   She reports initial symptoms of fever,chills, and body aches when illness just started. Described like a cold, progressing to laryngitis, dysphonia, and now experiencing constant right-sided throat pain, particularly severe in the mornings, exacerbated by swallowing. No known contact with strep or mono.  She experiences wheezing and shortness of breath during coughing spells. She also notes nasal congestion and rhinorrhea and "minor" ear discomfort on the right side over the past couple days.  Her husband was sick with similar symptoms but has since recovered.  For symptom management, she has been using over-the-counter cold and flu medications, which she has stopped, and now alternates between Tylenol and Ibuprofen for "pain and inflammation."   Review of Systems  Constitutional:  Positive for fatigue. Negative for chills, fever and weight loss.  HENT:  Positive for ear pain, postnasal drip and  rhinorrhea.   Respiratory:  Positive for cough. Negative for hemoptysis.   Cardiovascular:  Negative for chest pain.  Gastrointestinal:  Positive for nausea. Negative for abdominal pain, heartburn and vomiting.  Genitourinary:  Negative for decreased urine volume, dysuria and hematuria.  Musculoskeletal:  Negative for myalgias.  Skin:  Negative for rash.  Allergic/Immunologic: Positive for environmental allergies.  Neurological:  Negative for syncope and headaches.  See other pertinent positives and negatives in HPI.  Current Outpatient Medications on File Prior to Visit  Medication Sig Dispense Refill   amphetamine-dextroamphetamine (ADDERALL XR) 20 MG 24 hr capsule Take 20 mg by mouth every morning.     cetirizine (ZYRTEC) 10 MG tablet Take 10 mg by mouth daily.     Ciclopirox 1 % shampoo Apply 1 Application topically at bedtime. 120 mL 3   clonazePAM (KLONOPIN) 0.5 MG disintegrating tablet Take 1 mg by mouth 3 (three) times daily as needed (for anxiety).     cyanocobalamin (,VITAMIN B-12,) 1000 MCG/ML injection Give 1000 mcg of Vitamin b12 sq daily x 1 week, then weekly for 1 month, then once every 2 weeks 10 mL 6   cyclobenzaprine (FLEXERIL) 5 MG tablet Take 1 tablet (5 mg total) by mouth 3 (three) times daily as needed for muscle spasms. 30 tablet 1   fluconazole (DIFLUCAN) 150 MG tablet TAKE 1 TABLET BY MOUTH EVERY DAY 3 tablet 3   JUNEL FE 1/20 1-20 MG-MCG tablet TAKE 1 TABLET BY MOUTH DAILY. SKIP PLACEBO WEEK, TAKE CONTINUOUSLY 84 tablet 0   lamoTRIgine (LAMICTAL) 25 MG tablet Take 250 mg by mouth daily.     methotrexate (RHEUMATREX) 2.5 MG tablet Take by mouth. Take 10 TABLETS by mouth WEEKLY  omeprazole (PRILOSEC) 40 MG capsule Take 1 capsule (40 mg total) by mouth daily. 90 capsule 3   ondansetron (ZOFRAN) 8 MG tablet Take 1 tablet (8 mg total) by mouth every 8 (eight) hours as needed. 30 tablet 2   propranolol ER (INDERAL LA) 60 MG 24 hr capsule TAKE 1 CAPSULE BY MOUTH EVERY  DAY 90 capsule 1   SYRINGE-NEEDLE, DISP, 3 ML (BD ECLIPSE SYRINGE) 25G X 1" 3 ML MISC Give 1000 mcg of Vitamin b12 sq daily x 1 week, then weekly for 1 month, then once every 2 weeks 50 each 3   TARGRETIN 1 % GEL Apply 1 application topically every other day for first week, then apply daily for second week, then twice a day for third week, then 3 to 4 times daily thereafter. May utilize 60 g every 15 days 60 g 2   temazepam (RESTORIL) 15 MG capsule Take 15 mg by mouth at bedtime.     triamcinolone cream (KENALOG) 0.1 % Apply topically 2 (two) times daily as needed. 30 g 1   No current facility-administered medications on file prior to visit.   Past Medical History:  Diagnosis Date   Acid reflux    Allergy    Anemia    Anxiety    B12 deficiency    Bipolar 1 disorder (HCC)    Blood type, Rh negative    Cervical dysplasia    Depression    Essential tremor    Fibromyalgia    Goals of care, counseling/discussion 12/07/2020   Hives    IBS (irritable bowel syndrome)    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HCC)    Neuromuscular disorder (HCC)    fibromylagia   Spastic colon    Allergies  Allergen Reactions   Sertraline Hcl Other (See Comments)    Reaction:  Manic episode   Carbidopa-Levodopa Nausea And Vomiting   Social History   Socioeconomic History   Marital status: Married    Spouse name: Not on file   Number of children: Not on file   Years of education: Not on file   Highest education level: Not on file  Occupational History   Occupation: WOR[employer]    Employer: [employer]    Comment: [employer]  Tobacco Use   Smoking status: Former    Packs/day: 1.00    Years: 15.00    Additional pack years: 0.00    Total pack years: 15.00    Types: E-cigarettes, Cigarettes    Quit date: 11/17/2015    Years since quitting: 6.5   Smokeless tobacco: Never   Tobacco comments:    Pt Vapes everyday.  Vaping Use   Vaping Use: Every day   Substances: Nicotine, Flavoring  Substance and Sexual  Activity   Alcohol use: Yes    Alcohol/week: 4.0 standard drinks of alcohol    Types: 4 Cans of beer per week   Drug use: No   Sexual activity: Yes    Partners: Male    Birth control/protection: None    Comment: 1st intercourse 42 yo-Fewer than 5 partners Mirena 05/09/2016   Other Topics Concern   Not on file  Social History Narrative   Regular exercise- yes   Social Determinants of Health   Financial Resource Strain: Not on file  Food Insecurity: Not on file  Transportation Needs: Not on file  Physical Activity: Not on file  Stress: Not on file  Social Connections: Not on file   Vitals:   06/02/22 1556  BP: 130/80  Pulse: 88  Resp: 16  Temp: 98.4 F (36.9 C)  SpO2: 99%   Body mass index is 27.41 kg/m.  Physical Exam Vitals and nursing note reviewed.  Constitutional:      General: She is not in acute distress.    Appearance: She is well-developed. She is not ill-appearing.  HENT:     Head: Atraumatic.     Right Ear: External ear normal.     Left Ear: External ear normal. Tympanic membrane is not erythematous.     Ears:     Comments: Cerumen impaction right ear and excess left ear canal. Left TM seen partially.    Nose: Rhinorrhea present.     Mouth/Throat:     Mouth: Mucous membranes are moist.     Pharynx: Oropharynx is clear. No oropharyngeal exudate or posterior oropharyngeal erythema.  Eyes:     Conjunctiva/sclera: Conjunctivae normal.  Cardiovascular:     Rate and Rhythm: Normal rate and regular rhythm.     Heart sounds: No murmur heard. Pulmonary:     Effort: Pulmonary effort is normal. Prolonged expiration present. No respiratory distress.     Breath sounds: Normal breath sounds. No stridor.     Comments: Coughing spells when trying to breath deep. Musculoskeletal:     Cervical back: No edema or erythema. No muscular tenderness.  Lymphadenopathy:     Head:     Right side of head: No submandibular adenopathy.     Left side of head: No submandibular  adenopathy.     Cervical: No cervical adenopathy.  Skin:    General: Skin is warm.     Findings: No erythema or rash.  Neurological:     Mental Status: She is alert and oriented to person, place, and time.  Psychiatric:        Mood and Affect: Affect normal. Mood is anxious.   ASSESSMENT AND PLAN:  Ms. Sax was seen today for persistent productive cough.  Subacute cough Explained that cough and congestion can last a few more weeks after acute symptoms have resolved. Lung auscultation today negative except for mild prolonged expiration. Symptomatic treatment with benzonatate and Hycodan recommended, some side effect discussed. Further recommendation will be given according to chest x-ray result.  -     Benzonatate; Take 2 capsules (200 mg total) by mouth 2 (two) times daily as needed for up to 10 days.  Dispense: 30 capsule; Refill: 0 -     HYDROcodone Bit-Homatrop MBr; Take 5 mLs by mouth every 12 (twelve) hours as needed for up to 10 days for cough.  Dispense: 100 mL; Refill: 0 -     DG Chest 2 View; Future  Respiratory tract infection We discussed possible etiologies, most likely viral with residual cough and congestion. Some of her symptoms could be aggravated by allergies.  Sore throat could be also exacerbated by coughing spells, on examination no finding to suggest strep infection.  Reporting wheezing, none noted on auscultation today. Because persistent symptoms, it is appropriate to empirically treat with antibiotic, recommend doxycycline.  We discussed some side effects. She may also benefit from systemic steroids for mild bronchospasm that could be contributing to coughing spells. In the past she has not tolerated prednisone well, states that it caused manic like symptoms but she has done well with parenteral steroids. Here in the office and after verbal consent, she received Depo-Medrol 40 mg IM x 1. Albuterol inh 2 puff every 6 hours for a week then as needed for wheezing  or shortness of breath.   If chest x-ray is positive for consolidation/pneumonia, will add Augmentin. Instructed about warning signs. Follow-up with PCP in 10 days, before if needed.  -     Doxycycline Hyclate; Take 1 tablet (100 mg total) by mouth 2 (two) times daily for 7 days.  Dispense: 14 tablet; Refill: 0 -     methylPREDNISolone Acetate  Earache on right Possible causes discussed, it could be related with eustachian tube dysfunction as well as cerumen impaction. Monitor for new symptoms.  Excessive cerumen in right ear canal Because of respiratory symptoms, I do not recommend ear lavage. Debrox over-the-counter to apply in right ear for 7 days before next appointment with PCP.  Antibiotic causing adverse effect Reporting "yeast infections" with abx, requested Diflucan.  Recommend starting medication if she develops symptoms.  -     Fluconazole; Take 1 tablet (150 mg total) by mouth once for 1 dose.  Dispense: 1 tablet; Refill: 0  Return in about 10 days (around 06/12/2022) for cough with PCP.  Lillianna Sabel G. [place], MD  [facility]. [place] office.

## 2022-06-08 ENCOUNTER — Other Ambulatory Visit: Payer: Self-pay | Admitting: Radiology

## 2022-06-08 DIAGNOSIS — N943 Premenstrual tension syndrome: Secondary | ICD-10-CM

## 2022-06-13 ENCOUNTER — Other Ambulatory Visit (HOSPITAL_COMMUNITY): Payer: Self-pay

## 2022-06-13 MED ORDER — NORETHIN ACE-ETH ESTRAD-FE 1-20 MG-MCG PO TABS
ORAL_TABLET | ORAL | 0 refills | Status: DC
Start: 2022-06-13 — End: 2022-08-14

## 2022-06-13 NOTE — Telephone Encounter (Signed)
FYI. Just spoke w/ pharmacy and they stated that pt picked up a #28 supply on 05/22/22 and they did not receive the script that was sent on 05/23/22 for #84.  Last AEX 02/25/2021--scheduled for 07/28/2022/ Last mammo-never per last AEX notes, none seen in EMR.  Rx pend.

## 2022-06-16 ENCOUNTER — Other Ambulatory Visit: Payer: Self-pay

## 2022-06-19 ENCOUNTER — Other Ambulatory Visit: Payer: Self-pay

## 2022-07-18 ENCOUNTER — Encounter: Payer: Self-pay | Admitting: Hematology & Oncology

## 2022-07-19 ENCOUNTER — Other Ambulatory Visit: Payer: Self-pay | Admitting: Hematology & Oncology

## 2022-07-25 ENCOUNTER — Encounter: Payer: Self-pay | Admitting: Internal Medicine

## 2022-07-28 ENCOUNTER — Ambulatory Visit: Payer: BC Managed Care – PPO | Admitting: Radiology

## 2022-08-08 ENCOUNTER — Other Ambulatory Visit: Payer: Self-pay | Admitting: Hematology & Oncology

## 2022-08-14 ENCOUNTER — Other Ambulatory Visit: Payer: Self-pay

## 2022-08-14 DIAGNOSIS — N943 Premenstrual tension syndrome: Secondary | ICD-10-CM

## 2022-08-14 MED ORDER — NORETHIN ACE-ETH ESTRAD-FE 1-20 MG-MCG PO TABS
ORAL_TABLET | ORAL | 0 refills | Status: DC
Start: 2022-08-14 — End: 2022-09-05

## 2022-08-14 NOTE — Telephone Encounter (Signed)
Med refill request: Junel Fe Last AEX: 02/25/21 Next AEX: 09/05/22 Last MMG (if hormonal med) n/a Refill authorized: Please Advise, #84, 0 RF, pt sent msg in triage requesting medicine be filled today

## 2022-08-16 ENCOUNTER — Inpatient Hospital Stay: Payer: BC Managed Care – PPO | Admitting: Hematology & Oncology

## 2022-08-16 ENCOUNTER — Inpatient Hospital Stay: Payer: BC Managed Care – PPO

## 2022-08-17 ENCOUNTER — Telehealth: Payer: BC Managed Care – PPO | Admitting: Internal Medicine

## 2022-08-17 DIAGNOSIS — R062 Wheezing: Secondary | ICD-10-CM

## 2022-08-17 DIAGNOSIS — R051 Acute cough: Secondary | ICD-10-CM

## 2022-08-17 DIAGNOSIS — R350 Frequency of micturition: Secondary | ICD-10-CM | POA: Diagnosis not present

## 2022-08-17 MED ORDER — LEVOFLOXACIN 500 MG PO TABS: 500.0000 mg | ORAL_TABLET | Freq: Every day | ORAL | 0 refills | Status: AC

## 2022-08-17 MED ORDER — AZITHROMYCIN 250 MG PO TABS: ORAL_TABLET | ORAL | 1 refills | Status: AC

## 2022-08-17 MED ORDER — HYDROCODONE BIT-HOMATROP MBR 5-1.5 MG/5ML PO SOLN: 5.0000 mL | Freq: Four times a day (QID) | ORAL | 0 refills | Status: AC | PRN

## 2022-08-17 MED ORDER — METHYLPREDNISOLONE 4 MG PO TBPK: ORAL_TABLET | ORAL | 0 refills | Status: DC

## 2022-08-17 MED ORDER — FLUCONAZOLE 150 MG PO TABS: 150.0000 mg | ORAL_TABLET | Freq: Every day | ORAL | 3 refills | Status: DC

## 2022-08-17 MED ORDER — PROPRANOLOL HCL ER 60 MG PO CP24: 60.0000 mg | ORAL_CAPSULE | Freq: Every day | ORAL | 1 refills | Status: DC

## 2022-08-17 MED ORDER — ALBUTEROL SULFATE HFA 108 (90 BASE) MCG/ACT IN AERS: 2.0000 | INHALATION_SPRAY | Freq: Four times a day (QID) | RESPIRATORY_TRACT | 1 refills | Status: DC | PRN

## 2022-08-17 NOTE — Progress Notes (Signed)
Patient ID: Christine Duncan, female   DOB: 1980/04/25, 42 y.o.   MRN: [id number]  Virtual Visit via Video Note  I connected with Windy Canny on 08/20/22 at  1:40 PM EDT by a video enabled telemedicine application and verified that I am speaking with the correct person using two identifiers.  Location of all participants today Patient: at home Provider: at office   I discussed the limitations of evaluation and management by telemedicine and the availability of in person appointments. The patient expressed understanding and agreed to proceed.  History of Present Illness: Here after July 8 covid infection, much improved shortly after, then worse again now since July 23 just not improving with acute onset mild to mod 2-3 days ST, HA, general weakness and malaise, with prod cough greenish sputum, but Pt denies chest pain, orthopnea, PND, increased LE swelling, palpitations, dizziness or syncope  Has also had mild wheezing and sob last 3 days.  Has been covid neg this time, works in an assisted living Also incidentally has mild urinary dysuria for 2 days and frequency, Denies urinary symptoms such as urgency, flank pain, hematuria or n/v, fever, chills. Past Medical History:  Diagnosis Date   Acid reflux    Allergy    Anemia    Anxiety    B12 deficiency    Bipolar 1 disorder (HCC)    Blood type, Rh negative    Cervical dysplasia    Depression    Essential tremor    Fibromyalgia    Goals of care, counseling/discussion 12/07/2020   Hives    IBS (irritable bowel syndrome)    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HCC)    Neuromuscular disorder (HCC)    fibromylagia   Spastic colon    Past Surgical History:  Procedure Laterality Date   CERVICAL BIOPSY  W/ LOOP ELECTRODE EXCISION     CESAREAN SECTION  2006   CESAREAN SECTION  2011   COLPOSCOPY     DILATION AND CURETTAGE OF UTERUS  2005   INTRAUTERINE DEVICE INSERTION  05/09/2016   LAPAROSCOPIC APPENDECTOMY N/A 08/19/2016   Procedure: APPENDECTOMY  LAPAROSCOPIC;  Surgeon: Ovidio Kin, MD;  Location: [facility];  Service: General;  Laterality: N/A;   LEEP  07/2010   CIN II, Margins free    reports that she quit smoking about 6 years ago. Her smoking use included e-cigarettes and cigarettes. She started smoking about 21 years ago. She has a 15 pack-year smoking history. She has never used smokeless tobacco. She reports current alcohol use of about 4.0 standard drinks of alcohol per week. She reports that she does not use drugs. family history includes Alcohol abuse in her maternal grandfather; Allergic rhinitis in her brother and paternal aunt; Asthma in her paternal aunt; Bipolar disorder in her maternal grandfather and paternal grandmother; Breast cancer (age of onset: 32) in her paternal grandmother; Cancer in her paternal grandmother; Cancer (age of onset: 60) in her father; Colon cancer in her paternal grandmother; Depression in her maternal grandmother and mother; Diabetes in her maternal grandmother, paternal grandfather, and paternal grandmother; Drug abuse in her maternal grandfather; Esophageal cancer in her father; Hyperlipidemia in her paternal grandmother; Seizures in her maternal grandmother; Stroke in her paternal grandmother; Thyroid disease in her maternal grandmother and mother. Allergies  Allergen Reactions   Sertraline Hcl Other (See Comments)    Reaction:  Manic episode   Carbidopa-Levodopa Nausea And Vomiting   Current Outpatient Medications on File Prior to Visit  Medication Sig Dispense Refill  amphetamine-dextroamphetamine (ADDERALL XR) 20 MG 24 hr capsule Take 20 mg by mouth every morning.     cetirizine (ZYRTEC) 10 MG tablet Take 10 mg by mouth daily.     Ciclopirox 1 % shampoo Apply 1 Application topically at bedtime. 120 mL 3   clonazePAM (KLONOPIN) 0.5 MG disintegrating tablet Take 1 mg by mouth 3 (three) times daily as needed (for anxiety).     cyanocobalamin (,VITAMIN B-12,) 1000 MCG/ML injection Give 1000 mcg of  Vitamin b12 sq daily x 1 week, then weekly for 1 month, then once every 2 weeks 10 mL 6   cyclobenzaprine (FLEXERIL) 5 MG tablet Take 1 tablet (5 mg total) by mouth 3 (three) times daily as needed for muscle spasms. 30 tablet 1   lamoTRIgine (LAMICTAL) 25 MG tablet Take 250 mg by mouth daily.     methotrexate (RHEUMATREX) 2.5 MG tablet TAKE 10 TABLETS BY MOUTH WEEKLY 120 tablet 3   norethindrone-ethinyl estradiol-FE (JUNEL FE 1/20) 1-20 MG-MCG tablet TAKE 1 TABLET BY MOUTH DAILY. SKIP PLACEBO WEEK, TAKE CONTINUOUSLY 84 tablet 0   omeprazole (PRILOSEC) 40 MG capsule Take 1 capsule (40 mg total) by mouth daily. 90 capsule 3   ondansetron (ZOFRAN) 8 MG tablet Take 1 tablet (8 mg total) by mouth every 8 (eight) hours as needed. 30 tablet 2   SYRINGE-NEEDLE, DISP, 3 ML (BD ECLIPSE SYRINGE) 25G X 1" 3 ML MISC Give 1000 mcg of Vitamin b12 sq daily x 1 week, then weekly for 1 month, then once every 2 weeks 50 each 3   TARGRETIN 1 % GEL Apply 1 application topically every other day for first week, then apply daily for second week, then twice a day for third week, then 3 to 4 times daily thereafter. May utilize 60 g every 15 days 60 g 2   temazepam (RESTORIL) 15 MG capsule Take 15 mg by mouth at bedtime.     triamcinolone cream (KENALOG) 0.1 % Apply topically 2 (two) times daily as needed. 30 g 1   No current facility-administered medications on file prior to visit.     Observations/Objective: Alert, NAD, appropriate mood and affect, resps normal, cn 2-12 intact, moves all 4s, no visible rash or swelling Lab Results  Component Value Date   WBC 6.3 05/17/2022   HGB 12.7 05/17/2022   HCT 37.1 05/17/2022   PLT 350 05/17/2022   GLUCOSE 106 (H) 05/17/2022   CHOL 262 (H) 11/10/2020   TRIG 115.0 11/10/2020   HDL 66.90 11/10/2020   LDLCALC 172 (H) 11/10/2020   ALT 14 05/17/2022   AST 16 05/17/2022   NA 138 05/17/2022   K 4.2 05/17/2022   CL 105 05/17/2022   CREATININE 1.01 (H) 05/17/2022   BUN 11  05/17/2022   CO2 27 05/17/2022   TSH 0.982 02/01/2022   HGBA1C 5.5 04/03/2017   Assessment and Plan: See notes  Follow Up Instructions: See notes   I discussed the assessment and treatment plan with the patient. The patient was provided an opportunity to ask questions and all were answered. The patient agreed with the plan and demonstrated an understanding of the instructions.   The patient was advised to call back or seek an in-person evaluation if the symptoms worsen or if the condition fails to improve as anticipated.   Oliver Barre, MD

## 2022-08-20 ENCOUNTER — Encounter: Payer: Self-pay | Admitting: Internal Medicine

## 2022-08-20 DIAGNOSIS — R059 Cough, unspecified: Secondary | ICD-10-CM | POA: Insufficient documentation

## 2022-08-20 DIAGNOSIS — R062 Wheezing: Secondary | ICD-10-CM | POA: Insufficient documentation

## 2022-08-20 NOTE — Assessment & Plan Note (Signed)
Mild, can't r/o uti, for levaquin as above empiric, for urine culture if not improved

## 2022-08-20 NOTE — Assessment & Plan Note (Signed)
Mild to mod, for medrol dose pack, albuterol hfa prn  to f/u any worsening symptoms or concerns

## 2022-08-20 NOTE — Patient Instructions (Signed)
Please take all new medication as prescribed 

## 2022-08-20 NOTE — Assessment & Plan Note (Signed)
Mild to mod, c/w bonchitis vs pna, declines cxr, for antibx course levaquiin 500 every day, cough med prn,  to f/u any worsening symptoms or concerns

## 2022-08-28 ENCOUNTER — Other Ambulatory Visit: Payer: Self-pay | Admitting: Radiology

## 2022-08-28 DIAGNOSIS — N943 Premenstrual tension syndrome: Secondary | ICD-10-CM

## 2022-08-29 NOTE — Telephone Encounter (Signed)
Medication refill request: junel fe Last AEX:  02-25-21 Next AEX: 09-05-22 Last MMG (if hormonal medication request): none Refill authorized: rx was sent 08-14-22 supply with no refills. This rx denied

## 2022-09-05 ENCOUNTER — Other Ambulatory Visit (HOSPITAL_COMMUNITY)
Admission: RE | Admit: 2022-09-05 | Discharge: 2022-09-05 | Disposition: A | Discharge status: Home / Self Care | Payer: BC Managed Care – PPO | Source: Ambulatory Visit | Attending: Radiology | Admitting: Radiology

## 2022-09-05 ENCOUNTER — Ambulatory Visit (INDEPENDENT_AMBULATORY_CARE_PROVIDER_SITE_OTHER): Payer: BC Managed Care – PPO | Admitting: Radiology

## 2022-09-05 ENCOUNTER — Encounter: Payer: Self-pay | Admitting: Radiology

## 2022-09-05 VITALS — BP 126/88 | Ht 67.0 in | Wt 176.0 lb

## 2022-09-05 DIAGNOSIS — N943 Premenstrual tension syndrome: Secondary | ICD-10-CM | POA: Diagnosis not present

## 2022-09-05 DIAGNOSIS — R87810 Cervical high risk human papillomavirus (HPV) DNA test positive: Secondary | ICD-10-CM

## 2022-09-05 DIAGNOSIS — Z01419 Encounter for gynecological examination (general) (routine) without abnormal findings: Secondary | ICD-10-CM | POA: Insufficient documentation

## 2022-09-05 MED ORDER — NORETHIN ACE-ETH ESTRAD-FE 1-20 MG-MCG PO TABS
ORAL_TABLET | ORAL | 4 refills | Status: DC
Start: 2022-09-05 — End: 2023-09-13

## 2022-09-05 NOTE — Progress Notes (Signed)
Christine Duncan 18-Sep-1980 [id number]   History:  42 y.o. G5P2 presents for annual exam. Hx of LEEP, HPV+pap 2023, negative colpo. Doing well on OCPs to control her PMS sx. No other gyn concerns. Has never had a mammogram.  Gynecologic History No LMP recorded. (Menstrual status: Oral contraceptives).   Contraception/Family planning: OCP (estrogen/progesterone) Sexually active: yes Last Pap: 2023. Results were: abnormal, benign colpo. LEEP 2012 Last mammogram: never  Obstetric History OB History  Gravida Para Term Preterm AB Living  5 2 2   3 2   SAB IAB Ectopic Multiple Live Births  3            # Outcome Date GA Lbr Len/2nd Weight Sex Type Anes PTL Lv  5 Term           4 Term           3 SAB           2 SAB           1 SAB              The following portions of the patient's history were reviewed and updated as appropriate: allergies, current medications, past family history, past medical history, past social history, past surgical history, and problem list.  Review of Systems Pertinent items noted in HPI and remainder of comprehensive ROS otherwise negative.   Past medical history, past surgical history, family history and social history were all reviewed and documented in the EPIC chart.   Exam:  Vitals:   09/05/22 0843  BP: 126/88  Weight: 176 lb (79.8 kg)  Height: 5\' 7"  (1.702 m)   Body mass index is 27.57 kg/m.  General appearance:  Normal Thyroid:  Symmetrical, normal in size, without palpable masses or nodularity. Respiratory  Auscultation:  Clear without wheezing or rhonchi Cardiovascular  Auscultation:  Regular rate, without rubs, murmurs or gallops  Edema/varicosities:  Not grossly evident Abdominal  Soft,nontender, without masses, guarding or rebound.  Liver/spleen:  No organomegaly noted  Hernia:  None appreciated  Skin  Inspection:  Grossly normal Breasts: Examined lying and sitting.   Right: Without masses, retractions, nipple discharge or  axillary adenopathy.   Left: Without masses, retractions, nipple discharge or axillary adenopathy. Genitourinary   Inguinal/mons:  Normal without inguinal adenopathy  External genitalia:  Normal appearing vulva with no masses, tenderness, or lesions  BUS/Urethra/Skene's glands:  Normal without masses or exudate  Vagina:  Normal appearing with normal color and discharge, no lesions  Cervix:  Normal appearing without discharge or lesions  Uterus:  Normal in size, shape and contour.  Mobile, nontender  Adnexa/parametria:     Rt: Normal in size, without masses or tenderness.   Lt: Normal in size, without masses or tenderness.  Anus and perineum: Normal   Raynelle Fanning, CMA present for exam  Assessment/Plan:   1. Well woman exam with routine gynecological exam - Schedule mammogram - Cytology - PAP( [facility])  2. Papanicolaou smear of cervix with positive high risk human papilloma virus (HPV) test - Cytology - PAP( [place])  3. PMS (premenstrual syndrome) - norethindrone-ethinyl estradiol-FE (JUNEL FE 1/20) 1-20 MG-MCG tablet; TAKE 1 TABLET BY MOUTH DAILY. SKIP PLACEBO WEEK, TAKE CONTINUOUSLY  Dispense: 84 tablet; Refill: 4     Discussed SBE, colonoscopy and pap screening as directed/appropriate. Recommend of exercise weekly, including weight bearing exercise. Encouraged the use of seatbelts and sunscreen. Return in 1 year for annual or as needed.  Arlie Solomons B WHNP-BC 9:08 AM 09/05/2022

## 2022-09-08 LAB — CYTOLOGY - PAP
Comment: NEGATIVE
Diagnosis: NEGATIVE
High risk HPV: NEGATIVE

## 2022-10-02 ENCOUNTER — Ambulatory Visit: Payer: BC Managed Care – PPO | Admitting: Nurse Practitioner

## 2022-10-02 ENCOUNTER — Encounter: Payer: Self-pay | Admitting: Nurse Practitioner

## 2022-10-02 VITALS — BP 120/80 | HR 55 | Temp 97.9°F | Ht 67.0 in | Wt 180.8 lb

## 2022-10-02 DIAGNOSIS — R300 Dysuria: Secondary | ICD-10-CM

## 2022-10-02 DIAGNOSIS — R1032 Left lower quadrant pain: Secondary | ICD-10-CM

## 2022-10-02 LAB — POC URINALSYSI DIPSTICK (AUTOMATED)
Bilirubin, UA: NEGATIVE
Blood, UA: NEGATIVE
Glucose, UA: NEGATIVE
Ketones, UA: NEGATIVE
Leukocytes, UA: NEGATIVE
Nitrite, UA: NEGATIVE
Protein, UA: POSITIVE — AB
Spec Grav, UA: 1.015 (ref 1.010–1.025)
Urobilinogen, UA: 0.2 E.U./dL — AB
pH, UA: 6.5 (ref 5.0–8.0)

## 2022-10-02 LAB — BASIC METABOLIC PANEL
BUN: 9 mg/dL (ref 6–23)
CO2: 26 meq/L (ref 19–32)
Calcium: 8.7 mg/dL (ref 8.4–10.5)
Chloride: 104 meq/L (ref 96–112)
Creatinine, Ser: 0.84 mg/dL (ref 0.40–1.20)
GFR: 84.6 mL/min (ref >60.00)
Glucose, Bld: 82 mg/dL (ref 70–99)
Potassium: 4.1 meq/L (ref 3.5–5.1)
Sodium: 138 meq/L (ref 135–145)

## 2022-10-02 LAB — CBC WITH DIFFERENTIAL/PLATELET
Basophils Absolute: 0.1 10*3/uL (ref 0.0–0.1)
Basophils Relative: 1.1 % (ref 0.0–3.0)
Eosinophils Absolute: 0.1 10*3/uL (ref 0.0–0.7)
Eosinophils Relative: 1.2 % (ref 0.0–5.0)
HCT: 37.2 % (ref 36.0–46.0)
Hemoglobin: 12.1 g/dL (ref 12.0–15.0)
Lymphocytes Relative: 36.1 % (ref 12.0–46.0)
Lymphs Abs: 2.1 10*3/uL (ref 0.7–4.0)
MCHC: 32.7 g/dL (ref 30.0–36.0)
MCV: 92 fl (ref 78.0–100.0)
Monocytes Absolute: 0.4 10*3/uL (ref 0.1–1.0)
Monocytes Relative: 6.5 % (ref 3.0–12.0)
Neutro Abs: 3.2 10*3/uL (ref 1.4–7.7)
Neutrophils Relative %: 55.1 % (ref 43.0–77.0)
Platelets: 362 10*3/uL (ref 150.0–400.0)
RBC: 4.04 Mil/uL (ref 3.87–5.11)
RDW: 15.3 % (ref 11.5–15.5)
WBC: 5.8 10*3/uL (ref 4.0–10.5)

## 2022-10-02 MED ORDER — KETOROLAC TROMETHAMINE 30 MG/ML IJ SOLN
30.0000 mg | Freq: Once | INTRAMUSCULAR | Status: AC
Start: 2022-10-02 — End: 2022-10-02
  Administered 2022-10-02: 30 mg via INTRAMUSCULAR

## 2022-10-02 MED ORDER — DOXYCYCLINE HYCLATE 100 MG PO TABS
100.0000 mg | ORAL_TABLET | Freq: Two times a day (BID) | ORAL | 0 refills | Status: AC
Start: 2022-10-02 — End: 2022-10-09

## 2022-10-02 NOTE — Progress Notes (Unsigned)
Acute Office Visit  Subjective:    Patient ID: Christine Duncan, female    DOB: 11-19-1980, 42 y.o.   MRN: [id number]  Chief Complaint  Patient presents with   Urinary Frequency    She has been having lower back pain along with urinary burning and slight blood in urine. Symptoms started this weekend.   Urinary Frequency  This is a new problem. The current episode started in the past 7 days. The problem has been unchanged. The quality of the pain is described as burning. There has been no fever. She is Sexually active. There is No history of pyelonephritis. Associated symptoms include chills, flank pain and frequency. Pertinent negatives include no discharge, hematuria, hesitancy, nausea, possible pregnancy, sweats, urgency or vomiting. Treatments tried: diflucan. There is no history of catheterization, kidney stones, recurrent UTIs, a single kidney, urinary stasis or a urological procedure.  Took diflucan tab last night. No vaginal itching or discharge Treated for UTI 3months ago based on symptoms. Completed azithromycin and levaquin Denies need for STI screen. Amenorrhea due to use of oral contraception No blood in stool. No N/V. Reports hx of IBS: alternates between diarrhea and constipation. Last colonoscopy 2013: polyp removed. Advised to repeat in 38yrs.She is overdue for repeat colonoscopy. Ct ABDOMEN/pelvis 2018: no diverticulosis Current use of methotrexate weekly.  Outpatient Medications Prior to Visit  Medication Sig   amphetamine-dextroamphetamine (ADDERALL XR) 20 MG 24 hr capsule Take 20 mg by mouth every morning.   cetirizine (ZYRTEC) 10 MG tablet Take 10 mg by mouth daily.   Ciclopirox 1 % shampoo Apply 1 Application topically at bedtime.   clonazePAM (KLONOPIN) 0.5 MG disintegrating tablet Take 1 mg by mouth 3 (three) times daily as needed (for anxiety).   cyanocobalamin (,VITAMIN B-12,) 1000 MCG/ML injection Give 1000 mcg of Vitamin b12 sq daily x 1 week, then weekly for 1  month, then once every 2 weeks   cyclobenzaprine (FLEXERIL) 5 MG tablet Take 1 tablet (5 mg total) by mouth 3 (three) times daily as needed for muscle spasms.   lamoTRIgine (LAMICTAL) 25 MG tablet Take 250 mg by mouth daily.   methotrexate (RHEUMATREX) 2.5 MG tablet TAKE 10 TABLETS BY MOUTH WEEKLY   norethindrone-ethinyl estradiol-FE (JUNEL FE 1/20) 1-20 MG-MCG tablet TAKE 1 TABLET BY MOUTH DAILY. SKIP PLACEBO WEEK, TAKE CONTINUOUSLY   omeprazole (PRILOSEC) 40 MG capsule Take 1 capsule (40 mg total) by mouth daily.   ondansetron (ZOFRAN) 8 MG tablet Take 1 tablet (8 mg total) by mouth every 8 (eight) hours as needed.   propranolol ER (INDERAL LA) 60 MG 24 hr capsule Take 1 capsule (60 mg total) by mouth daily.   SYRINGE-NEEDLE, DISP, 3 ML (BD ECLIPSE SYRINGE) 25G X 1" 3 ML MISC Give 1000 mcg of Vitamin b12 sq daily x 1 week, then weekly for 1 month, then once every 2 weeks   TARGRETIN 1 % GEL Apply 1 application topically every other day for first week, then apply daily for second week, then twice a day for third week, then 3 to 4 times daily thereafter. May utilize 60 g every 15 days   temazepam (RESTORIL) 15 MG capsule Take 15 mg by mouth at bedtime.   triamcinolone cream (KENALOG) 0.1 % Apply topically 2 (two) times daily as needed.   No facility-administered medications prior to visit.   Reviewed past medical and social history.  Review of Systems  Constitutional:  Positive for chills.  Gastrointestinal:  Negative for nausea and vomiting.  Genitourinary:  Positive for flank pain and frequency. Negative for hematuria, hesitancy and urgency.   Per HPI     Objective:    Physical Exam Vitals and nursing note reviewed.  Cardiovascular:     Rate and Rhythm: Normal rate.     Pulses: Normal pulses.  Pulmonary:     Effort: Pulmonary effort is normal.  Abdominal:     General: Bowel sounds are normal. There is no distension.     Palpations: Abdomen is soft.     Tenderness: There is  abdominal tenderness in the suprapubic area and left lower quadrant. There is guarding. There is no right CVA tenderness or left CVA tenderness.  Neurological:     Mental Status: She is alert and oriented to person, place, and time.    BP 120/80   Pulse (!) 55   Temp 97.9 F (36.6 C) (Temporal)   Ht 5\' 7"  (1.702 m)   Wt 180 lb 12.8 oz (82 kg)   SpO2 99%   BMI 28.32 kg/m  {Vitals History (Optional):23777}  Results for orders placed or performed in visit on 10/02/22  POCT Urinalysis Dipstick (Automated)  Result Value Ref Range   Color, UA Yellow    Clarity, UA Clear    Glucose, UA Negative Negative   Bilirubin, UA negative    Ketones, UA negative    Spec Grav, UA 1.015 1.010 - 1.025   Blood, UA negative    pH, UA 6.5 5.0 - 8.0   Protein, UA Positive (A) Negative   Urobilinogen, UA 0.2 (A) 0.2 or 1.0 E.U./dL   Nitrite, UA negative    Leukocytes, UA Negative Negative      Assessment & Plan:   Problem List Items Addressed This Visit   None Visit Diagnoses     Dysuria    -  Primary   Relevant Medications   doxycycline (VIBRA-TABS) 100 MG tablet   Other Relevant Orders   POCT Urinalysis Dipstick (Automated) (Completed)   Urine Culture   CBC with Differential/Platelet   Basic metabolic panel   Continuous LLQ abdominal pain       Relevant Medications   ketorolac (TORADOL) 30 MG/ML injection 30 mg (Completed)   doxycycline (VIBRA-TABS) 100 MG tablet   Other Relevant Orders   Urine Culture   CBC with Differential/Platelet   Basic metabolic panel      Meds ordered this encounter  Medications   ketorolac (TORADOL) 30 MG/ML injection 30 mg   doxycycline (VIBRA-TABS) 100 MG tablet    Sig: Take 1 tablet (100 mg total) by mouth 2 (two) times daily for 7 days.    Dispense:  14 tablet    Refill:  0    Order Specific Question:   Supervising Provider    Answer:   Nadene Rubins ALFRED [[id number]]   Acute cystitis vs diverticulitis vs PID? Start probiotics since recent use  of oral abx for bronchitis Provided ED precaution.  Return if symptoms worsen or fail to improve.    Alysia Penna, NP

## 2022-10-02 NOTE — Patient Instructions (Signed)
Go to lab Start probiotics: Align or curturelle or florastor 1cap daily while taking oral abx Go to ED if symptoms worsen

## 2022-10-03 ENCOUNTER — Encounter: Payer: Self-pay | Admitting: Nurse Practitioner

## 2022-10-04 LAB — URINE CULTURE
MICRO NUMBER:: 15471383
SPECIMEN QUALITY:: ADEQUATE

## 2022-10-05 ENCOUNTER — Other Ambulatory Visit: Payer: BC Managed Care – PPO

## 2022-10-23 ENCOUNTER — Other Ambulatory Visit (HOSPITAL_COMMUNITY): Payer: Self-pay

## 2022-10-23 MED ORDER — AMPHETAMINE-DEXTROAMPHET ER 20 MG PO CP24
20.0000 mg | ORAL_CAPSULE | Freq: Every morning | ORAL | 0 refills | Status: DC
Start: 2022-10-23 — End: 2023-01-15
  Filled 2022-10-23: qty 30, 30d supply, fill #0

## 2022-10-24 ENCOUNTER — Other Ambulatory Visit (HOSPITAL_COMMUNITY): Payer: Self-pay

## 2022-11-03 ENCOUNTER — Other Ambulatory Visit: Payer: Self-pay | Admitting: Internal Medicine

## 2022-11-03 ENCOUNTER — Other Ambulatory Visit: Payer: Self-pay | Admitting: Hematology & Oncology

## 2022-11-03 DIAGNOSIS — R110 Nausea: Secondary | ICD-10-CM

## 2022-11-03 DIAGNOSIS — C866 Primary cutaneous CD30-positive T-cell proliferations not having achieved remission: Secondary | ICD-10-CM

## 2022-11-15 ENCOUNTER — Other Ambulatory Visit (HOSPITAL_COMMUNITY): Payer: Self-pay

## 2022-11-15 DIAGNOSIS — F3174 Bipolar disorder, in full remission, most recent episode manic: Secondary | ICD-10-CM | POA: Diagnosis not present

## 2022-11-15 DIAGNOSIS — F3176 Bipolar disorder, in full remission, most recent episode depressed: Secondary | ICD-10-CM | POA: Diagnosis not present

## 2022-11-15 MED ORDER — SERTRALINE HCL 25 MG PO TABS
25.0000 mg | ORAL_TABLET | Freq: Every day | ORAL | 3 refills | Status: DC
  Filled 2022-11-15: qty 90, 90d supply, fill #0

## 2022-12-11 ENCOUNTER — Other Ambulatory Visit: Payer: Self-pay | Admitting: Radiology

## 2022-12-11 DIAGNOSIS — Z1231 Encounter for screening mammogram for malignant neoplasm of breast: Secondary | ICD-10-CM

## 2022-12-11 NOTE — Telephone Encounter (Signed)
Spoke with patient. Patient reports RIGHT breast lump and tenderness on SBE this weekend. Denies any other symptoms. Attempted to schedule screening MMG and was advised will need Dx MMG. Advised OV needed for further evaluation, patient agreeable and OV scheduled for 12/12/22 at 0830 with JC.   Routing to provider for final review. Patient is agreeable to disposition. Will close encounter.

## 2022-12-12 ENCOUNTER — Ambulatory Visit (INDEPENDENT_AMBULATORY_CARE_PROVIDER_SITE_OTHER): Payer: BC Managed Care – PPO | Admitting: Radiology

## 2022-12-12 ENCOUNTER — Other Ambulatory Visit (HOSPITAL_COMMUNITY): Payer: Self-pay

## 2022-12-12 ENCOUNTER — Telehealth: Payer: Self-pay

## 2022-12-12 VITALS — BP 136/88 | Temp 99.3°F

## 2022-12-12 DIAGNOSIS — N6341 Unspecified lump in right breast, subareolar: Secondary | ICD-10-CM | POA: Diagnosis not present

## 2022-12-12 NOTE — Progress Notes (Signed)
   Christine Duncan 11/09/1980 [id number]   History:  42 y.o. tender, right breast lump. Noticed over the weekend. Never had mammogram. On OCPs.  Gynecologic History No LMP recorded. (Menstrual status: Oral contraceptives).    Obstetric History OB History  Gravida Para Term Preterm AB Living  5 2 2   3 2   SAB IAB Ectopic Multiple Live Births  3            # Outcome Date GA Lbr Len/2nd Weight Sex Type Anes PTL Lv  5 Term           4 Term           3 SAB           2 SAB           1 SAB              The following portions of the patient's history were reviewed and updated as appropriate: allergies, current medications, past family history, past medical history, past social history, past surgical history, and problem list.  Review of Systems Pertinent items noted in HPI and remainder of comprehensive ROS otherwise negative.   Past medical history, past surgical history, family history and social history were all reviewed and documented in the EPIC chart.   Exam:  Vitals:   12/12/22 0844  BP: 136/88  Temp: 99.3 F (37.4 C)  TempSrc: Oral   There is no height or weight on file to calculate BMI.  Physical Exam Constitutional:      Appearance: Normal appearance. She is normal weight.  Pulmonary:     Effort: Pulmonary effort is normal.  Chest:  Breasts:    Right: Mass present.     Left: Normal.    Neurological:     Mental Status: She is alert.      Raynelle Fanning CMA present for exam  Assessment/Plan:   1. Subareolar mass of right breast Diagnostic mammogram [facility]. Will still need screening of left scheduled.     Arlie Solomons B WHNP-BC 9:14 AM 12/12/2022

## 2022-12-12 NOTE — Telephone Encounter (Signed)
-----   Message from [place], [place] B sent at 12/12/2022  9:17 AM EST ----- Regarding: Mammogram Please send patient for diagnostic mammogram of right breast with u/s. Pt prefers [facility].

## 2022-12-12 NOTE — Telephone Encounter (Signed)
Spoke w/ Ebony @ [facility] and scheduled pt for first available at 01/16/2023 @ 0840.   Pt notified and voiced understanding. Advised of [facility]'s policies on no show/cancellations, preparing for appt, and also encouraged can call on daily basis to check for cancellations for sooner appt since they do not have a cancellation list.   Pt voiced understanding. Routing to provider for final review and closing encounter.

## 2022-12-15 ENCOUNTER — Other Ambulatory Visit: Payer: Self-pay

## 2023-01-15 ENCOUNTER — Inpatient Hospital Stay: Payer: 59 | Attending: Hematology & Oncology

## 2023-01-15 ENCOUNTER — Encounter: Payer: Self-pay | Admitting: *Deleted

## 2023-01-15 ENCOUNTER — Other Ambulatory Visit: Payer: Self-pay

## 2023-01-15 ENCOUNTER — Inpatient Hospital Stay: Payer: 59

## 2023-01-15 ENCOUNTER — Inpatient Hospital Stay (HOSPITAL_BASED_OUTPATIENT_CLINIC_OR_DEPARTMENT_OTHER): Payer: 59 | Admitting: Hematology & Oncology

## 2023-01-15 DIAGNOSIS — E049 Nontoxic goiter, unspecified: Secondary | ICD-10-CM

## 2023-01-15 DIAGNOSIS — C866 Primary cutaneous CD30-positive T-cell proliferations not having achieved remission: Secondary | ICD-10-CM | POA: Diagnosis present

## 2023-01-15 DIAGNOSIS — N6310 Unspecified lump in the right breast, unspecified quadrant: Secondary | ICD-10-CM | POA: Insufficient documentation

## 2023-01-15 DIAGNOSIS — M5440 Lumbago with sciatica, unspecified side: Secondary | ICD-10-CM

## 2023-01-15 DIAGNOSIS — M797 Fibromyalgia: Secondary | ICD-10-CM

## 2023-01-15 DIAGNOSIS — E538 Deficiency of other specified B group vitamins: Secondary | ICD-10-CM

## 2023-01-15 LAB — CBC WITH DIFFERENTIAL (CANCER CENTER ONLY)
Abs Immature Granulocytes: 0.02 10*3/uL (ref 0.00–0.07)
Basophils Absolute: 0.1 10*3/uL (ref 0.0–0.1)
Basophils Relative: 1 %
Eosinophils Absolute: 0 10*3/uL (ref 0.0–0.5)
Eosinophils Relative: 1 %
HCT: 36.3 % (ref 36.0–46.0)
Hemoglobin: 12.5 g/dL (ref 12.0–15.0)
Immature Granulocytes: 0 %
Lymphocytes Relative: 30 %
Lymphs Abs: 1.6 10*3/uL (ref 0.7–4.0)
MCH: 31.3 pg (ref 26.0–34.0)
MCHC: 34.4 g/dL (ref 30.0–36.0)
MCV: 90.8 fL (ref 80.0–100.0)
Monocytes Absolute: 0.3 10*3/uL (ref 0.1–1.0)
Monocytes Relative: 6 %
Neutro Abs: 3.3 10*3/uL (ref 1.7–7.7)
Neutrophils Relative %: 62 %
Platelet Count: 351 10*3/uL (ref 150–400)
RBC: 4 MIL/uL (ref 3.87–5.11)
RDW: 13.9 % (ref 11.5–15.5)
WBC Count: 5.4 10*3/uL (ref 4.0–10.5)
nRBC: 0 % (ref 0.0–0.2)

## 2023-01-15 LAB — CMP (CANCER CENTER ONLY)
ALT: 20 U/L (ref 0–44)
AST: 20 U/L (ref 15–41)
Albumin: 4.1 g/dL (ref 3.5–5.0)
Alkaline Phosphatase: 52 U/L (ref 38–126)
Anion gap: 10 (ref 5–15)
BUN: 11 mg/dL (ref 6–20)
CO2: 28 mmol/L (ref 22–32)
Calcium: 8.7 mg/dL — ABNORMAL LOW (ref 8.9–10.3)
Chloride: 103 mmol/L (ref 98–111)
Creatinine: 0.92 mg/dL (ref 0.44–1.00)
GFR, Estimated: >60 mL/min (ref >60)
Glucose, Bld: 96 mg/dL (ref 70–99)
Potassium: 4.2 mmol/L (ref 3.5–5.1)
Sodium: 141 mmol/L (ref 135–145)
Total Bilirubin: 0.5 mg/dL (ref <1.2)
Total Protein: 6.5 g/dL (ref 6.5–8.1)

## 2023-01-15 LAB — LACTATE DEHYDROGENASE: LDH: 177 U/L (ref 98–192)

## 2023-01-15 LAB — VITAMIN B12: Vitamin B-12: 847 pg/mL (ref 180–914)

## 2023-01-15 LAB — TSH: TSH: 0.877 u[IU]/mL (ref 0.350–4.500)

## 2023-01-15 LAB — FERRITIN: Ferritin: 106 ng/mL (ref 11–307)

## 2023-01-15 LAB — IRON AND IRON BINDING CAPACITY (CC-WL,HP ONLY)
Iron: 136 ug/dL (ref 28–170)
Saturation Ratios: 27 % (ref 10.4–31.8)
TIBC: 498 ug/dL — ABNORMAL HIGH (ref 250–450)
UIBC: 362 ug/dL (ref 148–442)

## 2023-01-16 ENCOUNTER — Ambulatory Visit
Admission: RE | Admit: 2023-01-16 | Discharge: 2023-01-16 | Disposition: A | Discharge status: Home / Self Care | Payer: BC Managed Care – PPO | Source: Ambulatory Visit | Attending: Radiology | Admitting: Radiology

## 2023-01-16 ENCOUNTER — Ambulatory Visit
Admission: RE | Admit: 2023-01-16 | Discharge: 2023-01-16 | Disposition: A | Discharge status: Home / Self Care | Payer: 59 | Source: Ambulatory Visit | Attending: Radiology | Admitting: Radiology

## 2023-01-16 DIAGNOSIS — N6341 Unspecified lump in right breast, subareolar: Secondary | ICD-10-CM

## 2023-02-13 ENCOUNTER — Inpatient Hospital Stay: Payer: 59 | Admitting: Hematology & Oncology

## 2023-02-13 ENCOUNTER — Inpatient Hospital Stay: Payer: 59 | Attending: Hematology & Oncology

## 2023-05-12 ENCOUNTER — Other Ambulatory Visit: Payer: Self-pay | Admitting: Internal Medicine

## 2023-06-04 ENCOUNTER — Telehealth: Admitting: Family Medicine

## 2023-06-04 ENCOUNTER — Ambulatory Visit: Payer: Self-pay

## 2023-06-04 ENCOUNTER — Encounter: Payer: Self-pay | Admitting: Family Medicine

## 2023-06-04 VITALS — HR 113 | Ht 67.0 in | Wt 180.0 lb

## 2023-06-04 DIAGNOSIS — G250 Essential tremor: Secondary | ICD-10-CM

## 2023-06-04 DIAGNOSIS — I10 Essential (primary) hypertension: Secondary | ICD-10-CM | POA: Diagnosis not present

## 2023-06-04 DIAGNOSIS — M791 Myalgia, unspecified site: Secondary | ICD-10-CM

## 2023-06-04 DIAGNOSIS — R000 Tachycardia, unspecified: Secondary | ICD-10-CM | POA: Diagnosis not present

## 2023-06-04 MED ORDER — CYCLOBENZAPRINE HCL 5 MG PO TABS: 5.0000 mg | ORAL_TABLET | Freq: Three times a day (TID) | ORAL | 0 refills | Status: AC | PRN

## 2023-06-04 MED ORDER — PROPRANOLOL HCL ER 60 MG PO CP24: 60.0000 mg | ORAL_CAPSULE | Freq: Every day | ORAL | 0 refills | Status: DC

## 2023-06-04 NOTE — Telephone Encounter (Unsigned)
 Copied from CRM [id number]. Topic: Clinical - Medication Refill >> Jun 04, 2023  8:28 AM Fonda T wrote: Medication: propranolol  ER (INDERAL  LA) 60 MG 24 hr capsule  Has the patient contacted their pharmacy? Yes (Agent: If no, request that the patient contact the pharmacy for the refill. If patient does not wish to contact the pharmacy document the reason why and proceed with request.) (Agent: If yes, when and what did the pharmacy advise?)  This is the patient's preferred pharmacy:   [id number] - [address] [place] [address] Phone: [phone] Fax: [phone]   Is this the correct pharmacy for this prescription? Yes If no, delete pharmacy and type the correct one.   Has the prescription been filled recently? Yes  Is the patient out of the medication? Yes  Has the patient been seen for an appointment in the last year OR does the patient have an upcoming appointment? Yes  Can we respond through MyChart? Yes  Agent: Please be advised that Rx refills may take up to 3 business days. We ask that you follow-up with your pharmacy.

## 2023-06-04 NOTE — Telephone Encounter (Signed)
 noted

## 2023-06-04 NOTE — Progress Notes (Addendum)
 Virtual Visit via Video Note  I connected with Christine Duncan on 06/04/23 at 11:00 AM EDT by a video enabled telemedicine application and verified that I am speaking with the correct person using two identifiers.  Patient Location: Other:  Work Dispensing optician: Office/Clinic  I discussed the limitations, risks, security, and privacy concerns of performing an evaluation and management service by video and the availability of in person appointments. I also discussed with the patient that there may be a patient responsible charge related to this service. The patient expressed understanding and agreed to proceed.  Subjective: PCP: Plotnikov, Oakley Bellman, MD  Chief Complaint  Patient presents with   Palpitations    And elevated HR - ran out of propranolol  x 3 days and started having symptoms without meds   Medication Refill    Pt would like rf on muscle relaxer to have on hand when needed.     Patient complains of elevated heart rate and palpitations since she ran out of propranolol  three days ago.  States she was placed on propranolol  for hypertension, tachycardia, and essential tremor.  She states she can feel her blood pressure is elevated but has not actually taken a reading.  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  She is also requesting a refill on her cyclobenzaprine  which she takes as needed.  She has not needed it recently, but just wants to have it on hand for when she does.   ROS: Per HPI  Current Outpatient Medications:    amphetamine -dextroamphetamine  (ADDERALL XR) 20 MG 24 hr capsule, Take 20 mg by mouth daily. Take 1 capsule (20 mg total) by mouth daily., Disp: , Rfl:    cetirizine (ZYRTEC) 10 MG tablet, Take 10 mg by mouth daily., Disp: , Rfl:    lamoTRIgine  (LAMICTAL ) 25 MG tablet, Take 250 mg by mouth daily., Disp: , Rfl:    lumateperone tosylate (CAPLYTA) 42 MG capsule, Take 42 mg by mouth  daily. 1/3 daily - sample med - has not started yet, Disp: , Rfl:    methotrexate  (RHEUMATREX) 2.5 MG tablet, TAKE 10 TABLETS BY MOUTH WEEKLY, Disp: 120 tablet, Rfl: 3   norethindrone -ethinyl estradiol-FE (JUNEL  FE 1/20) 1-20 MG-MCG tablet, TAKE 1 TABLET BY MOUTH DAILY. SKIP PLACEBO WEEK, TAKE CONTINUOUSLY, Disp: 84 tablet, Rfl: 4   omeprazole  (PRILOSEC) 40 MG capsule, Take 1 capsule (40 mg total) by mouth daily., Disp: 90 capsule, Rfl: 3   SYRINGE-NEEDLE, DISP, 3 ML (BD ECLIPSE SYRINGE) 25G X 1" 3 ML MISC, Give 1000 mcg of Vitamin b12 sq daily x 1 week, then weekly for 1 month, then once every 2 weeks, Disp: 50 each, Rfl: 3   TARGRETIN  1 % GEL, Apply 1 application topically every other day for first week, then apply daily for second week, then twice a day for third week, then 3 to 4 times daily thereafter. May utilize 60 g every 15 days, Disp: 60 g, Rfl: 2   temazepam  (RESTORIL ) 15 MG capsule, Take 15 mg by mouth at bedtime., Disp: , Rfl:    triamcinolone  cream (KENALOG ) 0.1 %, Apply topically 2 (two) times daily as needed., Disp: 30 g, Rfl: 1   Ciclopirox  1 % shampoo, Apply 1 Application topically at bedtime. (Patient not taking: Reported on 06/04/2023), Disp: 120 mL, Rfl: 3   clonazePAM  (KLONOPIN ) 0.5 MG disintegrating tablet, Take 1 mg by mouth 3 (three) times daily as needed (  for anxiety). (Patient not taking: Reported on 06/04/2023), Disp: , Rfl:    cyanocobalamin  (,VITAMIN B-12,) 1000 MCG/ML injection, Give 1000 mcg of Vitamin b12 sq daily x 1 week, then weekly for 1 month, then once every 2 weeks, Disp: 10 mL, Rfl: 6   cyclobenzaprine  (FLEXERIL ) 5 MG tablet, Take 1 tablet (5 mg total) by mouth 3 (three) times daily as needed for muscle spasms. (Patient not taking: Reported on 06/04/2023), Disp: 30 tablet, Rfl: 1   ondansetron  (ZOFRAN ) 8 MG tablet, TAKE 1 TABLET BY MOUTH EVERY 8 HOURS AS NEEDED (Patient not taking: Reported on 06/04/2023), Disp: 30 tablet, Rfl: 2   propranolol  ER (INDERAL  LA) 60 MG  24 hr capsule, Take 1 capsule (60 mg total) by mouth daily. (Patient not taking: Reported on 06/04/2023), Disp: 90 capsule, Rfl: 1  Observations/Objective: Today's Vitals   06/04/23 1031  Pulse: (!) 113  Weight: 180 lb (81.6 kg)  Height: 5\' 7"  (1.702 m)   Physical Exam Vitals reviewed.  Constitutional:      General: She is not in acute distress.    Appearance: Normal appearance. She is not ill-appearing, toxic-appearing or diaphoretic.  HENT:     Head: Normocephalic and atraumatic.  Eyes:     General: No scleral icterus.       Right eye: No discharge.        Left eye: No discharge.     Conjunctiva/sclera: Conjunctivae normal.  Cardiovascular:     Rate and Rhythm: Normal rate.  Pulmonary:     Effort: Pulmonary effort is normal. No respiratory distress.  Musculoskeletal:        General: Normal range of motion.     Cervical back: Normal range of motion.  Skin:    General: Skin is warm and dry.     Capillary Refill: Capillary refill takes less than 2 seconds.  Neurological:     General: No focal deficit present.     Mental Status: She is alert and oriented to person, place, and time. Mental status is at baseline.  Psychiatric:        Mood and Affect: Mood normal.        Behavior: Behavior normal.        Thought Content: Thought content normal.        Judgment: Judgment normal.     Assessment and Plan: 1-3. Essential tremor (Primary)/Essential hypertension/Tachycardia Uncontrolled without medication.  Refill sent in today. - propranolol  ER (INDERAL  LA) 60 MG 24 hr capsule; Take 1 capsule (60 mg total) by mouth daily.  Dispense: 90 capsule; Refill: 0  4. Muscle pain Refill for use as needed. - cyclobenzaprine  (FLEXERIL ) 5 MG tablet; Take 1 tablet (5 mg total) by mouth 3 (three) times daily as needed for muscle spasms.  Dispense: 30 tablet; Refill: 0   Follow Up Instructions: Return for CPE with PCP ASAP (overdue).   I discussed the assessment and treatment plan with  the patient. The patient was provided an opportunity to ask questions, and all were answered. The patient agreed with the plan and demonstrated an understanding of the instructions.   The patient was advised to call back or seek an in-person evaluation if the symptoms worsen or if the condition fails to improve as anticipated.  The above assessment and management plan was discussed with the patient. The patient verbalized understanding of and has agreed to the management plan.   Zorita Hiss, FNP

## 2023-06-04 NOTE — Telephone Encounter (Signed)
  Chief Complaint: palpitations Symptoms: increased HR 130s with activity, increased tremors d/t being out of propanolol  Frequency: 1-2 days  Pertinent Negatives: Patient denies SOB Disposition: [] ED /[] Urgent Care (no appt availability in office) / [x] Appointment(In office/virtual)/ []  [place] [facility]/ [] Home Care/ [] Refused Recommended Disposition /[] [place] [facility]/ []  Follow-up with PCP Additional Notes: pt requesting refill for propanolol but needing OV d/t recent deny of rx. Appt scheduled today for 1100 with [place], NP. Pt has access to check BP and HR during VV.    Copied from CRM [id number]. Topic: Clinical - Red Word Triage >> Jun 04, 2023  8:33 AM El Gravely T wrote: [facility] that prompted transfer to Nurse Triage: Rapid heart rate, increase in tremors Reason for Disposition  [1] Palpitations AND [2] no improvement after using Care Advice  Answer Assessment - Initial Assessment Questions 1. DESCRIPTION: "Please describe your heart rate or heartbeat that you are having" (e.g., fast/slow, regular/irregular, skipped or extra beats, "palpitations")     Increased heart rate and increased tremors  2. ONSET: "When did it start?" (Minutes, hours or days)      1-2 days  4. PATTERN "Does it come and go, or has it been constant since it started?"  "Does it get worse with exertion?"   "Are you feeling it now?"     Resting HR 80s, activity HR 130s 6. HEART RATE: "Can you tell me your heart rate?" "How many beats in 15 seconds?"  (Note: not all patients can do this)       See #4  7. RECURRENT SYMPTOM: "Have you ever had this before?" If Yes, ask: "When was the last time?" and "What happened that time?"       8. CAUSE: "What do you think is causing the palpitations?"     Ran out of propanolol several days ago  10. OTHER SYMPTOMS: "Do you have any other symptoms?" (e.g., dizziness, chest pain, sweating, difficulty breathing)       Increased tremors and possible BP  Protocols used:  Heart Rate and Heartbeat Questions-A-AH

## 2023-07-23 ENCOUNTER — Encounter: Payer: Self-pay | Admitting: Family Medicine

## 2023-07-27 ENCOUNTER — Other Ambulatory Visit: Payer: Self-pay | Admitting: Internal Medicine

## 2023-07-27 MED ORDER — BD ECLIPSE SYRINGE 25G X 1" 3 ML MISC: 3 refills | Status: AC

## 2023-07-30 ENCOUNTER — Other Ambulatory Visit: Payer: Self-pay

## 2023-07-30 MED ORDER — CYANOCOBALAMIN 1000 MCG/ML IJ SOLN: INTRAMUSCULAR | 6 refills | Status: AC

## 2023-08-29 ENCOUNTER — Other Ambulatory Visit: Payer: Self-pay | Admitting: Family Medicine

## 2023-08-29 DIAGNOSIS — I10 Essential (primary) hypertension: Secondary | ICD-10-CM

## 2023-08-29 DIAGNOSIS — R000 Tachycardia, unspecified: Secondary | ICD-10-CM

## 2023-08-29 DIAGNOSIS — G250 Essential tremor: Secondary | ICD-10-CM

## 2023-09-07 ENCOUNTER — Ambulatory Visit: Payer: BC Managed Care – PPO | Admitting: Radiology

## 2023-09-13 ENCOUNTER — Other Ambulatory Visit: Payer: Self-pay | Admitting: Radiology

## 2023-09-13 DIAGNOSIS — N943 Premenstrual tension syndrome: Secondary | ICD-10-CM

## 2023-09-13 NOTE — Telephone Encounter (Signed)
 Med refill request:  JUNEL  FE 1/20 1-20 MG-MCG tablet  Start date: 09/05/22 - Disp: #84 tab with 4 refills   Last OV: 12/12/22 Last AEX: 09/05/22 Next AEX: Not yet scheduled Last MMG (if hormonal med): 01/16/23  Refill authorized? Please Advise.

## 2023-12-05 ENCOUNTER — Other Ambulatory Visit: Payer: Self-pay | Admitting: Internal Medicine

## 2023-12-05 DIAGNOSIS — G250 Essential tremor: Secondary | ICD-10-CM

## 2023-12-05 DIAGNOSIS — I10 Essential (primary) hypertension: Secondary | ICD-10-CM

## 2023-12-05 DIAGNOSIS — R000 Tachycardia, unspecified: Secondary | ICD-10-CM

## 2023-12-11 ENCOUNTER — Encounter: Payer: Self-pay | Admitting: Internal Medicine

## 2023-12-13 ENCOUNTER — Other Ambulatory Visit: Payer: Self-pay | Admitting: Internal Medicine

## 2023-12-13 DIAGNOSIS — R000 Tachycardia, unspecified: Secondary | ICD-10-CM

## 2023-12-13 DIAGNOSIS — G250 Essential tremor: Secondary | ICD-10-CM

## 2023-12-13 DIAGNOSIS — I10 Essential (primary) hypertension: Secondary | ICD-10-CM

## 2023-12-13 MED ORDER — PROPRANOLOL HCL ER 60 MG PO CP24: 60.0000 mg | ORAL_CAPSULE | Freq: Every day | ORAL | 3 refills | Status: DC

## 2023-12-18 ENCOUNTER — Encounter: Payer: Self-pay | Admitting: Internal Medicine

## 2023-12-18 ENCOUNTER — Ambulatory Visit: Admitting: Internal Medicine

## 2023-12-18 VITALS — BP 142/90 | HR 81 | Temp 98.2°F | Ht 67.0 in | Wt 179.0 lb

## 2023-12-18 DIAGNOSIS — I10 Essential (primary) hypertension: Secondary | ICD-10-CM

## 2023-12-18 DIAGNOSIS — Z720 Tobacco use: Secondary | ICD-10-CM

## 2023-12-18 DIAGNOSIS — R000 Tachycardia, unspecified: Secondary | ICD-10-CM

## 2023-12-18 DIAGNOSIS — E538 Deficiency of other specified B group vitamins: Secondary | ICD-10-CM | POA: Diagnosis not present

## 2023-12-18 DIAGNOSIS — C866 Primary cutaneous CD30-positive T-cell proliferations not having achieved remission: Secondary | ICD-10-CM

## 2023-12-18 DIAGNOSIS — F3110 Bipolar disorder, current episode manic without psychotic features, unspecified: Secondary | ICD-10-CM

## 2023-12-18 DIAGNOSIS — L509 Urticaria, unspecified: Secondary | ICD-10-CM | POA: Diagnosis not present

## 2023-12-18 DIAGNOSIS — Z0000 Encounter for general adult medical examination without abnormal findings: Secondary | ICD-10-CM | POA: Diagnosis not present

## 2023-12-18 DIAGNOSIS — F319 Bipolar disorder, unspecified: Secondary | ICD-10-CM

## 2023-12-18 DIAGNOSIS — G250 Essential tremor: Secondary | ICD-10-CM

## 2023-12-18 LAB — LIPID PANEL
Cholesterol: 292 mg/dL — ABNORMAL HIGH (ref 0–200)
HDL: 41.9 mg/dL (ref >39.00)
LDL Cholesterol: 198 mg/dL — ABNORMAL HIGH (ref 0–99)
NonHDL: 250.07
Total CHOL/HDL Ratio: 7
Triglycerides: 261 mg/dL — ABNORMAL HIGH (ref 0.0–149.0)
VLDL: 52.2 mg/dL — ABNORMAL HIGH (ref 0.0–40.0)

## 2023-12-18 LAB — COMPREHENSIVE METABOLIC PANEL WITH GFR
ALT: 18 U/L (ref 0–35)
AST: 17 U/L (ref 0–37)
Albumin: 4.2 g/dL (ref 3.5–5.2)
Alkaline Phosphatase: 61 U/L (ref 39–117)
BUN: 11 mg/dL (ref 6–23)
CO2: 28 meq/L (ref 19–32)
Calcium: 9 mg/dL (ref 8.4–10.5)
Chloride: 103 meq/L (ref 96–112)
Creatinine, Ser: 0.8 mg/dL (ref 0.40–1.20)
GFR: 88.94 mL/min (ref >60.00)
Glucose, Bld: 93 mg/dL (ref 70–99)
Potassium: 3.6 meq/L (ref 3.5–5.1)
Sodium: 138 meq/L (ref 135–145)
Total Bilirubin: 0.5 mg/dL (ref 0.2–1.2)
Total Protein: 7.2 g/dL (ref 6.0–8.3)

## 2023-12-18 LAB — TSH: TSH: 1.14 u[IU]/mL (ref 0.35–5.50)

## 2023-12-18 LAB — URINALYSIS
Ketones, ur: NEGATIVE
Leukocytes,Ua: NEGATIVE
Nitrite: NEGATIVE
Specific Gravity, Urine: 1.025 (ref 1.000–1.030)
Urine Glucose: NEGATIVE
Urobilinogen, UA: 0.2 (ref 0.0–1.0)
pH: 5.5 (ref 5.0–8.0)

## 2023-12-18 LAB — CBC WITH DIFFERENTIAL/PLATELET
Basophils Absolute: 0.1 K/uL (ref 0.0–0.1)
Basophils Relative: 1.1 % (ref 0.0–3.0)
Eosinophils Absolute: 0 K/uL (ref 0.0–0.7)
Eosinophils Relative: 0.8 % (ref 0.0–5.0)
HCT: 41.1 % (ref 36.0–46.0)
Hemoglobin: 14 g/dL (ref 12.0–15.0)
Lymphocytes Relative: 35.2 % (ref 12.0–46.0)
Lymphs Abs: 2 K/uL (ref 0.7–4.0)
MCHC: 34.1 g/dL (ref 30.0–36.0)
MCV: 85.5 fl (ref 78.0–100.0)
Monocytes Absolute: 0.4 K/uL (ref 0.1–1.0)
Monocytes Relative: 6.4 % (ref 3.0–12.0)
Neutro Abs: 3.2 K/uL (ref 1.4–7.7)
Neutrophils Relative %: 56.5 % (ref 43.0–77.0)
Platelets: 363 K/uL (ref 150.0–400.0)
RBC: 4.81 Mil/uL (ref 3.87–5.11)
RDW: 12.7 % (ref 11.5–15.5)
WBC: 5.7 K/uL (ref 4.0–10.5)

## 2023-12-18 LAB — LACTATE DEHYDROGENASE: LDH: 162 U/L (ref 100–200)

## 2023-12-18 MED ORDER — HYDROXYZINE PAMOATE 25 MG PO CAPS: 25.0000 mg | ORAL_CAPSULE | Freq: Three times a day (TID) | ORAL | 5 refills | Status: AC | PRN

## 2023-12-18 MED ORDER — MONTELUKAST SODIUM 10 MG PO TABS: 10.0000 mg | ORAL_TABLET | Freq: Every day | ORAL | 3 refills | Status: AC

## 2023-12-18 MED ORDER — PROPRANOLOL HCL ER 60 MG PO CP24: 60.0000 mg | ORAL_CAPSULE | Freq: Two times a day (BID) | ORAL | 3 refills | Status: AC

## 2023-12-18 NOTE — Assessment & Plan Note (Signed)
 Chronic  F/u w/Dr Vincente Hydroxyzine  should be helpful

## 2023-12-18 NOTE — Assessment & Plan Note (Addendum)
 Re-start Hydroxyzine  Zyrtec qd Singulair  qd

## 2023-12-18 NOTE — Progress Notes (Signed)
 Subjective:  Patient ID: Christine Duncan, female    DOB: August 11, 1980  Age: 43 y.o. MRN: [id number]  CC: Annual Exam (Annual Exam)   HPI Christine Duncan presents for a well exam C/o tachycardia C/o hives 2/week - using Hydroxyzine  Seeing Dr Vincente for mood and bipolar disorder Pt stopped methotrexate  2 mo ago  Outpatient Medications Prior to Visit  Medication Sig Dispense Refill   amphetamine -dextroamphetamine  (ADDERALL XR) 20 MG 24 hr capsule Take 20 mg by mouth daily. Take 1 capsule (20 mg total) by mouth daily.     amphetamine -dextroamphetamine  (ADDERALL) 20 MG tablet Take 20 mg by mouth daily.     clonazePAM  (KLONOPIN ) 0.5 MG disintegrating tablet Take 1 mg by mouth 3 (three) times daily as needed (for anxiety).     cyanocobalamin  (VITAMIN B12) 1000 MCG/ML injection Give 1000 mcg of Vitamin b12 sq daily x 1 week, then weekly for 1 month, then once every 2 weeks 10 mL 6   cyclobenzaprine  (FLEXERIL ) 5 MG tablet Take 1 tablet (5 mg total) by mouth 3 (three) times daily as needed for muscle spasms. 30 tablet 0   lamoTRIgine  (LAMICTAL ) 25 MG tablet Take 250 mg by mouth daily.     lumateperone tosylate (CAPLYTA) 42 MG capsule Take 42 mg by mouth daily. 1/3 daily - sample med - has not started yet     methotrexate  (RHEUMATREX) 2.5 MG tablet TAKE 10 TABLETS BY MOUTH WEEKLY 120 tablet 3   norethindrone -ethinyl estradiol-FE (JUNEL  FE 1/20) 1-20 MG-MCG tablet TAKE 1 TABLET BY MOUTH EVERY DAY *SKIP PLACEBO WEEK TAKE CONTINUOUSLY* 112 tablet 4   omeprazole  (PRILOSEC) 40 MG capsule Take 1 capsule (40 mg total) by mouth daily. 90 capsule 3   SYRINGE-NEEDLE, DISP, 3 ML (BD ECLIPSE SYRINGE) 25G X 1 3 ML MISC Give 1000 mcg of Vitamin b12 sq every 2 weeks 50 each 3   TARGRETIN  1 % GEL Apply 1 application topically every other day for first week, then apply daily for second week, then twice a day for third week, then 3 to 4 times daily thereafter. May utilize 60 g every 15 days 60 g 2   temazepam  (RESTORIL )  15 MG capsule Take 15 mg by mouth at bedtime.     triamcinolone  cream (KENALOG ) 0.1 % Apply topically 2 (two) times daily as needed. 30 g 1   cetirizine (ZYRTEC) 10 MG tablet Take 10 mg by mouth daily.     propranolol  ER (INDERAL  LA) 60 MG 24 hr capsule Take 1 capsule (60 mg total) by mouth daily. 90 capsule 3   Ciclopirox  1 % shampoo Apply 1 Application topically at bedtime. (Patient not taking: Reported on 12/18/2023) 120 mL 3   ondansetron  (ZOFRAN ) 8 MG tablet TAKE 1 TABLET BY MOUTH EVERY 8 HOURS AS NEEDED (Patient not taking: Reported on 12/18/2023) 30 tablet 2   No facility-administered medications prior to visit.    ROS: Review of Systems  Constitutional:  Negative for activity change, appetite change, chills, fatigue and unexpected weight change.  HENT:  Negative for congestion, mouth sores and sinus pressure.   Eyes:  Negative for visual disturbance.  Respiratory:  Negative for cough and chest tightness.   Gastrointestinal:  Negative for abdominal pain and nausea.  Genitourinary:  Negative for difficulty urinating, frequency and vaginal pain.  Musculoskeletal:  Negative for back pain and gait problem.  Skin:  Positive for rash. Negative for pallor.  Neurological:  Negative for dizziness, tremors, weakness, numbness and headaches.  Psychiatric/Behavioral:  Positive for decreased concentration and dysphoric mood. Negative for confusion, sleep disturbance and suicidal ideas. The patient is nervous/anxious.     Objective:  BP (!) 142/90   Pulse 81   Temp 98.2 F (36.8 C)   Ht 5' 7 (1.702 m)   Wt 179 lb (81.2 kg)   SpO2 99%   BMI 28.04 kg/m   BP Readings from Last 3 Encounters:  12/18/23 (!) 142/90  12/12/22 136/88  10/02/22 120/80    Wt Readings from Last 3 Encounters:  12/18/23 179 lb (81.2 kg)  06/04/23 180 lb (81.6 kg)  10/02/22 180 lb 12.8 oz (82 kg)    Physical Exam Constitutional:      General: She is not in acute distress.    Appearance: She is  well-developed. She is obese.  HENT:     Head: Normocephalic.     Right Ear: External ear normal.     Left Ear: External ear normal.     Nose: Nose normal.  Eyes:     General:        Right eye: No discharge.        Left eye: No discharge.     Conjunctiva/sclera: Conjunctivae normal.     Pupils: Pupils are equal, round, and reactive to light.  Neck:     Thyroid : No thyromegaly.     Vascular: No JVD.     Trachea: No tracheal deviation.  Cardiovascular:     Rate and Rhythm: Normal rate and regular rhythm.     Heart sounds: Normal heart sounds.  Pulmonary:     Effort: No respiratory distress.     Breath sounds: No stridor. No wheezing.  Abdominal:     General: Bowel sounds are normal. There is no distension.     Palpations: Abdomen is soft. There is no mass.     Tenderness: There is no abdominal tenderness. There is no guarding or rebound.  Musculoskeletal:        General: No tenderness.     Cervical back: Normal range of motion and neck supple. No rigidity.     Right lower leg: No edema.     Left lower leg: No edema.  Lymphadenopathy:     Cervical: No cervical adenopathy.  Skin:    Findings: No erythema or rash.  Neurological:     Mental Status: She is oriented to person, place, and time.     Cranial Nerves: No cranial nerve deficit.     Motor: No abnormal muscle tone.     Coordination: Coordination normal.     Deep Tendon Reflexes: Reflexes normal.  Psychiatric:        Behavior: Behavior normal.        Thought Content: Thought content normal.        Judgment: Judgment normal.   SK in the umbillicus Small scattered lipomas  Lab Results  Component Value Date   WBC 5.7 12/18/2023   HGB 14.0 12/18/2023   HCT 41.1 12/18/2023   PLT 363.0 12/18/2023   GLUCOSE 93 12/18/2023   CHOL 292 (H) 12/18/2023   TRIG 261.0 (H) 12/18/2023   HDL 41.90 12/18/2023   LDLCALC 198 (H) 12/18/2023   ALT 18 12/18/2023   AST 17 12/18/2023   NA 138 12/18/2023   K 3.6 12/18/2023   CL  103 12/18/2023   CREATININE 0.80 12/18/2023   BUN 11 12/18/2023   CO2 28 12/18/2023   TSH 1.14 12/18/2023   HGBA1C 5.5 04/03/2017    MM 3D  DIAGNOSTIC MAMMOGRAM BILATERAL BREAST Result Date: 01/16/2023 CLINICAL DATA:  Palpable abnormality in the RIGHT breast for 1 month. EXAM: DIGITAL DIAGNOSTIC BILATERAL MAMMOGRAM WITH TOMOSYNTHESIS AND CAD; ULTRASOUND RIGHT BREAST LIMITED TECHNIQUE: Bilateral digital diagnostic mammography and breast tomosynthesis was performed. The images were evaluated with computer-aided detection. ; Targeted ultrasound examination of the right breast was performed COMPARISON:  None available. ACR Breast Density Category b: There are scattered areas of fibroglandular density. FINDINGS: Spot tangential view is performed in the UPPER-OUTER QUADRANT of the RIGHT breast, demonstrating asymmetry with scattered partially obscured oval masses. LEFT breast is negative. On physical exam, I palpate soft thickening without discrete mass in the 10-11 o'clock location of the RIGHT breast. Targeted ultrasound is performed, showing scattered cysts and fibroglandular tissue in the UPPER-OUTER QUADRANT of the RIGHT breast, corresponding to the area of concern. No suspicious mass, distortion, or acoustic shadowing is demonstrated with ultrasound. IMPRESSION: No mammographic or ultrasound evidence for malignancy. Benign fibrocystic changes in the RIGHT breast. RECOMMENDATION: Screening mammogram in one year.(Code:SM-B-01Y) I have discussed the findings and recommendations with the patient. If applicable, a reminder letter will be sent to the patient regarding the next appointment. BI-RADS CATEGORY  2: Benign. Electronically Signed   By: Almarie Daring M.D.   On: 01/16/2023 09:19   US  LIMITED ULTRASOUND INCLUDING AXILLA RIGHT BREAST Result Date: 01/16/2023 CLINICAL DATA:  Palpable abnormality in the RIGHT breast for 1 month. EXAM: DIGITAL DIAGNOSTIC BILATERAL MAMMOGRAM WITH TOMOSYNTHESIS AND CAD;  ULTRASOUND RIGHT BREAST LIMITED TECHNIQUE: Bilateral digital diagnostic mammography and breast tomosynthesis was performed. The images were evaluated with computer-aided detection. ; Targeted ultrasound examination of the right breast was performed COMPARISON:  None available. ACR Breast Density Category b: There are scattered areas of fibroglandular density. FINDINGS: Spot tangential view is performed in the UPPER-OUTER QUADRANT of the RIGHT breast, demonstrating asymmetry with scattered partially obscured oval masses. LEFT breast is negative. On physical exam, I palpate soft thickening without discrete mass in the 10-11 o'clock location of the RIGHT breast. Targeted ultrasound is performed, showing scattered cysts and fibroglandular tissue in the UPPER-OUTER QUADRANT of the RIGHT breast, corresponding to the area of concern. No suspicious mass, distortion, or acoustic shadowing is demonstrated with ultrasound. IMPRESSION: No mammographic or ultrasound evidence for malignancy. Benign fibrocystic changes in the RIGHT breast. RECOMMENDATION: Screening mammogram in one year.(Code:SM-B-01Y) I have discussed the findings and recommendations with the patient. If applicable, a reminder letter will be sent to the patient regarding the next appointment. BI-RADS CATEGORY  2: Benign. Electronically Signed   By: Almarie Daring M.D.   On: 01/16/2023 09:19    Assessment & Plan:   Problem List Items Addressed This Visit     B12 deficiency   Worse.  The patient is willing to give herself subcutaneous shots.  Start B12 sq -  1000 mcg of Vitamin b12 sq daily x 1 week, then weekly for 1 month, then once every 2 weeks. She will try gluten-free diet       Bipolar I disorder, most recent episode (or current) manic (HCC)   11/14 - started on 11/24/12 recullrent Dr Vincente - appt Feb  Start Depakote  and Lithium  RTC 1 wk  Potential benefits of a long term lithium /depakote   use as well as potential risks  and complications  were explained to the patient and were aknowledged.       DSORD BIPOLAR I, UNSPC, MOST RECENT EPSD   Chronic  F/u w/Dr Vincente Hydroxyzine  should be helpful  Essential hypertension          Relevant Medications   propranolol  ER (INDERAL  LA) 60 MG 24 hr capsule   Essential tremor   On Propranolol       Relevant Medications   propranolol  ER (INDERAL  LA) 60 MG 24 hr capsule   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HCC)   Skin rash: lymphomatoid papulosis Seeing Dr Timmy. Pt stopped methotrexate  2 mo ago 10/2023      Tobacco use   Vaping since [id number]      Urticaria   Re-start Hydroxyzine  Zyrtec qd Singulair  qd      Relevant Orders   TSH (Completed)   Urinalysis (Completed)   CBC with Differential/Platelet (Completed)   Lipid panel (Completed)   Comprehensive metabolic panel with GFR (Completed)   Lactate dehydrogenase   Well adult exam - Primary   Relevant Orders   TSH (Completed)   Urinalysis (Completed)   CBC with Differential/Platelet (Completed)   Lipid panel (Completed)   Comprehensive metabolic panel with GFR (Completed)   Lactate dehydrogenase   Other Visit Diagnoses       Tachycardia       Relevant Medications   propranolol  ER (INDERAL  LA) 60 MG 24 hr capsule   Other Relevant Orders   TSH (Completed)   Urinalysis (Completed)   CBC with Differential/Platelet (Completed)   Lipid panel (Completed)   Comprehensive metabolic panel with GFR (Completed)   Lactate dehydrogenase         Meds ordered this encounter  Medications   propranolol  ER (INDERAL  LA) 60 MG 24 hr capsule    Sig: Take 1 capsule (60 mg total) by mouth in the morning and at bedtime.    Dispense:  180 capsule    Refill:  3   hydrOXYzine  (VISTARIL ) 25 MG capsule    Sig: Take 1 capsule (25 mg total) by mouth every 8 (eight) hours as needed for anxiety or itching.    Dispense:  90 capsule    Refill:  5   montelukast  (SINGULAIR ) 10 MG tablet    Sig: Take 1 tablet (10 mg total) by mouth at  bedtime.    Dispense:  90 tablet    Refill:  3      Follow-up: Return in about 3 months (around 03/17/2024) for a follow-up visit.  Marolyn Noel, MD

## 2023-12-18 NOTE — Assessment & Plan Note (Signed)
 Skin rash: lymphomatoid papulosis Seeing Dr Timmy. Pt stopped methotrexate  2 mo ago 10/2023

## 2023-12-18 NOTE — Assessment & Plan Note (Signed)
 Vaping since [id number]

## 2023-12-18 NOTE — Assessment & Plan Note (Signed)
On Propranolol 

## 2023-12-18 NOTE — Assessment & Plan Note (Signed)
11/14 - started on 11/24/12 recullrent Dr Evelene Croon - appt Feb  Start Depakote and Lithium RTC 1 wk  Potential benefits of a long term lithium/depakote  use as well as potential risks  and complications were explained to the patient and were aknowledged.

## 2023-12-18 NOTE — Assessment & Plan Note (Signed)
Worse.  The patient is willing to give herself subcutaneous shots.  Start B12 sq -  1000 mcg of Vitamin b12 sq daily x 1 week, then weekly for 1 month, then once every 2 weeks. She will try gluten-free diet

## 2023-12-18 NOTE — Assessment & Plan Note (Signed)
 SABRA

## 2023-12-24 ENCOUNTER — Ambulatory Visit: Payer: Self-pay | Admitting: Internal Medicine

## 2023-12-31 ENCOUNTER — Other Ambulatory Visit: Payer: Self-pay | Admitting: Family

## 2023-12-31 MED ORDER — CICLOPIROX 1 % EX SHAM: 1.0000 | MEDICATED_SHAMPOO | Freq: Every day | CUTANEOUS | 3 refills | Status: AC

## 2024-01-03 ENCOUNTER — Other Ambulatory Visit: Payer: Self-pay | Admitting: Internal Medicine

## 2024-01-03 MED ORDER — HYDROXYZINE HCL 25 MG PO TABS: 12.5000 mg | ORAL_TABLET | Freq: Three times a day (TID) | ORAL | 3 refills | Status: AC | PRN

## 2024-02-05 ENCOUNTER — Encounter: Payer: Self-pay | Admitting: Internal Medicine

## 2024-02-06 ENCOUNTER — Ambulatory Visit: Admitting: Radiology

## 2024-02-06 ENCOUNTER — Encounter: Payer: Self-pay | Admitting: Radiology

## 2024-02-06 VITALS — BP 136/88 | HR 80 | Ht 67.0 in | Wt 180.0 lb

## 2024-02-06 DIAGNOSIS — Z01419 Encounter for gynecological examination (general) (routine) without abnormal findings: Secondary | ICD-10-CM | POA: Diagnosis not present

## 2024-02-06 DIAGNOSIS — N943 Premenstrual tension syndrome: Secondary | ICD-10-CM | POA: Diagnosis not present

## 2024-02-06 DIAGNOSIS — Z1331 Encounter for screening for depression: Secondary | ICD-10-CM | POA: Diagnosis not present

## 2024-02-06 DIAGNOSIS — Z1211 Encounter for screening for malignant neoplasm of colon: Secondary | ICD-10-CM

## 2024-02-06 MED ORDER — JUNEL FE 1/20 1-20 MG-MCG PO TABS: ORAL_TABLET | ORAL | 4 refills | Status: AC

## 2024-02-06 NOTE — Progress Notes (Signed)
 "  Christine Duncan [birth year] [id number]   History:  44 y.o. G5P2 presents for annual exam. Hx of LEEP 2012, HPV+pap 2023, negative colpo normal pap 2024. Doing well on OCPs to control her PMS sx. No other gyn concerns.   Gynecologic History No LMP recorded. (Menstrual status: Oral contraceptives). Period Cycle (Days):  (continuous COC, amenorrhea) Contraception/Family planning: OCP (estrogen/progesterone) Sexually active: yes Last Pap: 09/05/22. Results were: normal, HPV negative Last mammogram: 12/2022 Colonoscopy: 2013, due this year  Obstetric History OB History  Gravida Para Term Preterm AB Living  5 2 2  3 2   SAB IAB Ectopic Multiple Live Births  3        # Outcome Date GA Lbr Len/2nd Weight Sex Type Anes PTL Lv  5 Term           4 Term           3 SAB           2 SAB           1 SAB               02/06/2024   11:40 AM 06/04/2023   10:31 AM 11/10/2021    8:26 AM  Depression screen PHQ 2/9  Decreased Interest 0 0 0  Down, Depressed, Hopeless 0 1 1  PHQ - 2 Score 0 1 1  Altered sleeping   1  Tired, decreased energy   2  Change in appetite   1  Feeling bad or failure about yourself    0  Trouble concentrating   2  Moving slowly or fidgety/restless   1  Suicidal thoughts   0  PHQ-9 Score   8   Difficult doing work/chores   Somewhat difficult     Data saved with a previous flowsheet row definition     The following portions of the patient's history were reviewed and updated as appropriate: allergies, current medications, past family history, past medical history, past social history, past surgical history, and problem list.  Review of Systems Pertinent items noted in HPI and remainder of comprehensive ROS otherwise negative.   Past medical history, past surgical history, family history and social history were all reviewed and documented in the EPIC chart.   Exam:  Vitals:   02/06/24 1138  BP: 136/88  Pulse: 80  SpO2: 99%  Weight: 180 lb (81.6 kg)  Height:  5' 7 (1.702 m)    Body mass index is 28.19 kg/m.  General appearance:  Normal Thyroid :  Symmetrical, normal in size, without palpable masses or nodularity. Respiratory  Auscultation:  Clear without wheezing or rhonchi Cardiovascular  Auscultation:  Regular rate, without rubs, murmurs or gallops  Edema/varicosities:  Not grossly evident Abdominal  Soft,nontender, without masses, guarding or rebound.  Liver/spleen:  No organomegaly noted  Hernia:  None appreciated  Skin  Inspection:  Grossly normal Breasts: Examined lying and sitting.   Right: Without masses, retractions, nipple discharge or axillary adenopathy.   Left: Without masses, retractions, nipple discharge or axillary adenopathy. Genitourinary   Inguinal/mons:  Normal without inguinal adenopathy  External genitalia:  Normal appearing vulva with no masses, tenderness, or lesions  BUS/Urethra/Skene's glands:  Normal without masses or exudate  Vagina:  Normal appearing with normal color and discharge, no lesions  Cervix:  Normal appearing without discharge or lesions  Uterus:  Normal in size, shape and contour.  Mobile, nontender  Adnexa/parametria:     Rt: Normal in size, without masses or  tenderness.   Lt: Normal in size, without masses or tenderness.  Anus and perineum: Normal   Christine Duncan, CMA present for exam  Assessment/Plan:   1. Well woman exam with routine gynecological exam (Primary) Pap 2027 Schedule mammogram  2. PMS (premenstrual syndrome) Continue OCPs - norethindrone -ethinyl estradiol-FE (JUNEL  FE 1/20) 1-20 MG-MCG tablet; TAKE 1 TABLET BY MOUTH EVERY DAY *SKIP PLACEBO WEEK TAKE CONTINUOUSLY*  Dispense: 112 tablet; Refill: 4  3. Depression screening negative  4. Colon cancer screening - Ambulatory referral to Gastroenterology     Return in 1 year for annual or as needed.   Christine Duncan B WHNP-BC 11:56 AM 02/06/2024  "

## 2024-02-06 NOTE — Patient Instructions (Signed)
 Preventive Care 46-46 Years Old, Female  Preventive care refers to lifestyle choices and visits with your health care provider that can promote health and wellness. Preventive care visits are also called wellness exams.  What can I expect for my preventive care visit?  Counseling  Your health care provider may ask you questions about your:  Medical history, including:  Past medical problems.  Family medical history.  Pregnancy history.  Current health, including:  Menstrual cycle.  Method of birth control.  Emotional well-being.  Home life and relationship well-being.  Sexual activity and sexual health.  Lifestyle, including:  Alcohol, nicotine or tobacco, and drug use.  Access to firearms.  Diet, exercise, and sleep habits.  Work and work Astronomer.  Sunscreen use.  Safety issues such as seatbelt and bike helmet use.  Physical exam  Your health care provider will check your:  Height and weight. These may be used to calculate your BMI (body mass index). BMI is a measurement that tells if you are at a healthy weight.  Waist circumference. This measures the distance around your waistline. This measurement also tells if you are at a healthy weight and may help predict your risk of certain diseases, such as type 2 diabetes and high blood pressure.  Heart rate and blood pressure.  Body temperature.  Skin for abnormal spots.  What immunizations do I need?    Vaccines are usually given at various ages, according to a schedule. Your health care provider will recommend vaccines for you based on your age, medical history, and lifestyle or other factors, such as travel or where you work.  What tests do I need?  Screening  Your health care provider may recommend screening tests for certain conditions. This may include:  Lipid and cholesterol levels.  Diabetes screening. This is done by checking your blood sugar (glucose) after you have not eaten for a while (fasting).  Pelvic exam and Pap test.  Hepatitis B test.  Hepatitis C  test.  HIV (human immunodeficiency virus) test.  STI (sexually transmitted infection) testing, if you are at risk.  Lung cancer screening.  Colorectal cancer screening.  Mammogram. Talk with your health care provider about when you should start having regular mammograms. This may depend on whether you have a family history of breast cancer.  BRCA-related cancer screening. This may be done if you have a family history of breast, ovarian, tubal, or peritoneal cancers.  Bone density scan. This is done to screen for osteoporosis.  Talk with your health care provider about your test results, treatment options, and if necessary, the need for more tests.  Follow these instructions at home:  Eating and drinking    Eat a diet that includes fresh fruits and vegetables, whole grains, lean protein, and low-fat dairy products.  Take vitamin and mineral supplements as recommended by your health care provider.  Do not drink alcohol if:  Your health care provider tells you not to drink.  You are pregnant, may be pregnant, or are planning to become pregnant.  If you drink alcohol:  Limit how much you have to 0-1 drink a day.  Know how much alcohol is in your drink. In the [place] one drink equals one 12 oz bottle of beer (355 mL), one 5 oz glass of wine (148 mL), or one 1 oz glass of hard liquor (44 mL).  Lifestyle  Brush your teeth every morning and night with fluoride toothpaste. Floss one time each day.  Exercise for at least  30 minutes 5 or more days each week.  Do not use any products that contain nicotine or tobacco. These products include cigarettes, chewing tobacco, and vaping devices, such as e-cigarettes. If you need help quitting, ask your health care provider.  Do not use drugs.  If you are sexually active, practice safe sex. Use a condom or other form of protection to prevent STIs.  If you do not wish to become pregnant, use a form of birth control. If you plan to become pregnant, see your health care provider for a  prepregnancy visit.  Take aspirin only as told by your health care provider. Make sure that you understand how much to take and what form to take. Work with your health care provider to find out whether it is safe and beneficial for you to take aspirin daily.  Find healthy ways to manage stress, such as:  Meditation, yoga, or listening to music.  Journaling.  Talking to a trusted person.  Spending time with friends and family.  Minimize exposure to UV radiation to reduce your risk of skin cancer.  Safety  Always wear your seat belt while driving or riding in a vehicle.  Do not drive:  If you have been drinking alcohol. Do not ride with someone who has been drinking.  When you are tired or distracted.  While texting.  If you have been using any mind-altering substances or drugs.  Wear a helmet and other protective equipment during sports activities.  If you have firearms in your house, make sure you follow all gun safety procedures.  Seek help if you have been physically or sexually abused.  What's next?  Visit your health care provider once a year for an annual wellness visit.  Ask your health care provider how often you should have your eyes and teeth checked.  Stay up to date on all vaccines.  This information is not intended to replace advice given to you by your health care provider. Make sure you discuss any questions you have with your health care provider.  Document Revised: 06/30/2020 Document Reviewed: 06/30/2020  Elsevier Patient Education  2024 [employer].

## 2024-02-06 NOTE — Telephone Encounter (Signed)
 Pt has stated Good Morning,   I still haven't heard from anyone about scheduling the heart test?

## 2024-02-09 ENCOUNTER — Other Ambulatory Visit: Payer: Self-pay | Admitting: Internal Medicine

## 2024-02-09 DIAGNOSIS — R002 Palpitations: Secondary | ICD-10-CM
